# Patient Record
Sex: Female | Born: 1985 | Race: White | Hispanic: No | Smoking: Never smoker
Health system: Southern US, Community
[De-identification: ages and names within clinical notes are randomized; demographics above are authoritative.]

## PROBLEM LIST (undated history)

## (undated) ENCOUNTER — Inpatient Hospital Stay (HOSPITAL_COMMUNITY): Payer: Self-pay

## (undated) DIAGNOSIS — O139 Gestational [pregnancy-induced] hypertension without significant proteinuria, unspecified trimester: Secondary | ICD-10-CM

## (undated) DIAGNOSIS — IMO0001 Reserved for inherently not codable concepts without codable children: Secondary | ICD-10-CM

## (undated) DIAGNOSIS — R87619 Unspecified abnormal cytological findings in specimens from cervix uteri: Secondary | ICD-10-CM

## (undated) DIAGNOSIS — G43909 Migraine, unspecified, not intractable, without status migrainosus: Secondary | ICD-10-CM

## (undated) DIAGNOSIS — K429 Umbilical hernia without obstruction or gangrene: Secondary | ICD-10-CM

## (undated) DIAGNOSIS — O09299 Supervision of pregnancy with other poor reproductive or obstetric history, unspecified trimester: Secondary | ICD-10-CM

## (undated) DIAGNOSIS — Z5189 Encounter for other specified aftercare: Secondary | ICD-10-CM

## (undated) DIAGNOSIS — R58 Hemorrhage, not elsewhere classified: Secondary | ICD-10-CM

## (undated) DIAGNOSIS — D649 Anemia, unspecified: Secondary | ICD-10-CM

## (undated) HISTORY — DX: Hemorrhage, not elsewhere classified: R58

## (undated) HISTORY — DX: Encounter for other specified aftercare: Z51.89

## (undated) HISTORY — DX: Reserved for inherently not codable concepts without codable children: IMO0001

## (undated) HISTORY — DX: Supervision of pregnancy with other poor reproductive or obstetric history, unspecified trimester: O09.299

## (undated) HISTORY — DX: Unspecified abnormal cytological findings in specimens from cervix uteri: R87.619

## (undated) HISTORY — DX: Migraine, unspecified, not intractable, without status migrainosus: G43.909

## (undated) HISTORY — PX: TONSILLECTOMY: SUR1361

---
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
## 2007-09-30 ENCOUNTER — Inpatient Hospital Stay (HOSPITAL_COMMUNITY): Admission: AD | Admit: 2007-09-30 | Discharge: 2007-10-01 | Payer: Self-pay | Admitting: Family Medicine

## 2007-11-23 ENCOUNTER — Inpatient Hospital Stay (HOSPITAL_COMMUNITY): Admission: AD | Admit: 2007-11-23 | Discharge: 2007-11-23 | Payer: Self-pay | Admitting: Obstetrics & Gynecology

## 2008-03-02 ENCOUNTER — Inpatient Hospital Stay (HOSPITAL_COMMUNITY): Admission: AD | Admit: 2008-03-02 | Discharge: 2008-03-05 | Payer: Self-pay | Admitting: Obstetrics & Gynecology

## 2008-03-06 ENCOUNTER — Inpatient Hospital Stay (HOSPITAL_COMMUNITY): Admission: AD | Admit: 2008-03-06 | Discharge: 2008-03-06 | Payer: Self-pay | Admitting: Obstetrics & Gynecology

## 2008-03-08 ENCOUNTER — Inpatient Hospital Stay (HOSPITAL_COMMUNITY): Admission: AD | Admit: 2008-03-08 | Discharge: 2008-03-08 | Payer: Self-pay | Admitting: Obstetrics and Gynecology

## 2008-04-09 ENCOUNTER — Ambulatory Visit: Payer: Self-pay | Admitting: Diagnostic Radiology

## 2008-04-09 ENCOUNTER — Emergency Department (HOSPITAL_BASED_OUTPATIENT_CLINIC_OR_DEPARTMENT_OTHER): Admission: EM | Admit: 2008-04-09 | Discharge: 2008-04-09 | Payer: Self-pay | Admitting: Emergency Medicine

## 2008-04-25 ENCOUNTER — Emergency Department (HOSPITAL_COMMUNITY): Admission: EM | Admit: 2008-04-25 | Discharge: 2008-04-25 | Payer: Self-pay | Admitting: Emergency Medicine

## 2008-04-26 ENCOUNTER — Encounter: Admission: RE | Admit: 2008-04-26 | Discharge: 2008-04-26 | Payer: Self-pay | Admitting: Surgery

## 2008-07-05 ENCOUNTER — Emergency Department (HOSPITAL_BASED_OUTPATIENT_CLINIC_OR_DEPARTMENT_OTHER): Admission: EM | Admit: 2008-07-05 | Discharge: 2008-07-05 | Payer: Self-pay | Admitting: Emergency Medicine

## 2010-02-28 ENCOUNTER — Emergency Department (HOSPITAL_BASED_OUTPATIENT_CLINIC_OR_DEPARTMENT_OTHER): Admission: EM | Admit: 2010-02-28 | Discharge: 2010-02-28 | Payer: Self-pay | Admitting: Emergency Medicine

## 2010-02-28 ENCOUNTER — Ambulatory Visit: Payer: Self-pay | Admitting: Diagnostic Radiology

## 2010-04-02 DIAGNOSIS — Z5189 Encounter for other specified aftercare: Secondary | ICD-10-CM

## 2010-04-02 HISTORY — DX: Encounter for other specified aftercare: Z51.89

## 2010-04-23 ENCOUNTER — Encounter: Payer: Self-pay | Admitting: Surgery

## 2010-04-29 ENCOUNTER — Inpatient Hospital Stay (HOSPITAL_COMMUNITY)
Admission: AD | Admit: 2010-04-29 | Discharge: 2010-04-29 | Payer: Self-pay | Source: Home / Self Care | Attending: Obstetrics & Gynecology | Admitting: Obstetrics & Gynecology

## 2010-04-29 LAB — URINALYSIS, ROUTINE W REFLEX MICROSCOPIC
Leukocytes, UA: NEGATIVE
Protein, ur: NEGATIVE mg/dL
Urine Glucose, Fasting: NEGATIVE mg/dL
pH: 7 (ref 5.0–8.0)

## 2010-04-29 LAB — URINE MICROSCOPIC-ADD ON

## 2010-04-29 LAB — WET PREP, GENITAL: Clue Cells Wet Prep HPF POC: NONE SEEN

## 2010-05-02 LAB — GC/CHLAMYDIA PROBE AMP, GENITAL: Chlamydia, DNA Probe: UNDETERMINED

## 2010-06-13 LAB — WET PREP, GENITAL
Clue Cells Wet Prep HPF POC: NONE SEEN
Trich, Wet Prep: NONE SEEN
Yeast Wet Prep HPF POC: NONE SEEN

## 2010-06-13 LAB — URINALYSIS, ROUTINE W REFLEX MICROSCOPIC
Bilirubin Urine: NEGATIVE
Nitrite: NEGATIVE
Specific Gravity, Urine: 1.016 (ref 1.005–1.030)
Urobilinogen, UA: 0.2 mg/dL (ref 0.0–1.0)
pH: 8 (ref 5.0–8.0)

## 2010-06-13 LAB — PREGNANCY, URINE: Preg Test, Ur: POSITIVE

## 2010-06-13 LAB — GC/CHLAMYDIA PROBE AMP, GENITAL: Chlamydia, DNA Probe: NEGATIVE

## 2010-07-12 LAB — URINALYSIS, ROUTINE W REFLEX MICROSCOPIC
Bilirubin Urine: NEGATIVE
Specific Gravity, Urine: 1.018 (ref 1.005–1.030)
Urobilinogen, UA: 1 mg/dL (ref 0.0–1.0)
pH: 6.5 (ref 5.0–8.0)

## 2010-07-12 LAB — URINE MICROSCOPIC-ADD ON

## 2010-07-17 LAB — BASIC METABOLIC PANEL
BUN: 14 mg/dL (ref 6–23)
CO2: 25 mEq/L (ref 19–32)
Calcium: 9.8 mg/dL (ref 8.4–10.5)
Calcium: 9.1 mg/dL (ref 8.4–10.5)
Chloride: 104 mEq/L (ref 96–112)
Creatinine, Ser: 0.7 mg/dL (ref 0.4–1.2)
Creatinine, Ser: 0.65 mg/dL (ref 0.4–1.2)
GFR calc Af Amer: >60 mL/min (ref >60)
GFR calc Af Amer: >60 mL/min (ref >60)
GFR calc non Af Amer: >60 mL/min (ref >60)
Sodium: 136 mEq/L (ref 135–145)

## 2010-07-17 LAB — DIFFERENTIAL
Basophils Relative: 2 % — ABNORMAL HIGH (ref 0–1)
Basophils Relative: 0 % (ref 0–1)
Eosinophils Absolute: 0.2 10*3/uL (ref 0.0–0.7)
Lymphocytes Relative: 16 % (ref 12–46)
Lymphs Abs: 1.6 10*3/uL (ref 0.7–4.0)
Monocytes Relative: 6 % (ref 3–12)
Neutro Abs: 6.1 10*3/uL (ref 1.7–7.7)
Neutro Abs: 7.9 10*3/uL — ABNORMAL HIGH (ref 1.7–7.7)
Neutrophils Relative %: 61 % (ref 43–77)
Neutrophils Relative %: 77 % (ref 43–77)

## 2010-07-17 LAB — CBC
Hemoglobin: 11.6 g/dL — ABNORMAL LOW (ref 12.0–15.0)
MCHC: 34 g/dL (ref 30.0–36.0)
MCV: 85.3 fL (ref 78.0–100.0)
Platelets: 391 10*3/uL (ref 150–400)
RBC: 3.78 MIL/uL — ABNORMAL LOW (ref 3.87–5.11)
RBC: 4.01 MIL/uL (ref 3.87–5.11)
WBC: 9.8 10*3/uL (ref 4.0–10.5)
WBC: 10.3 10*3/uL (ref 4.0–10.5)

## 2010-07-17 LAB — D-DIMER, QUANTITATIVE: D-Dimer, Quant: 0.36 ug/mL-FEU (ref 0.00–0.48)

## 2010-08-15 NOTE — Discharge Summary (Signed)
Rebekah Smith, Rebekah Smith            ACCOUNT NO.:  [id number]   MEDICAL RECORD NO.:  [id number]          PATIENT TYPE:  INP   LOCATION:  9147                          FACILITY:  [facility]   PHYSICIAN:  Gerrit Friends. Aldona Bar, M.D.   DATE OF BIRTH:  1985/07/30   DATE OF ADMISSION:  03/02/2008  DATE OF DISCHARGE:  03/05/2008                               DISCHARGE SUMMARY   DISCHARGE DIAGNOSES:  1. A 38-week intrauterine pregnancy, delivered 8 pounds 9 ounces female      infant, Apgars 9 and 9.  2. Blood type O+.  3. Preeclampsia - induction of labor.   PROCEDURES:  1. Normal spontaneous delivery.  2. Second-degree tear and repair.  3. Labetalol for hypertension, postpartum.   SUMMARY:  This 25 year old primigravida with a due date of March 19, 2008, was admitted at 37-[redacted] weeks gestation with elevated blood pressure  for induction of labor.  She was felt to be preeclamptic.  She was begun  on Pitocin.  She progressed and on the afternoon of March 03, 2008,  had a normal spontaneous delivery of viable female infant with Apgar of 9  and 9 - infant weighed 8 pounds 9 ounces - second-degree tear with  repair and her postpartum course other than anemia and some hypertension  was benign.  She was given some labetalol postpartum with resolution of  her hypertension and in fact, discharged on labetalol 200 mg every 8  hours for 2 weeks as well at the time of discharge.  Her hemoglobin at  the time of discharge was 7.5 which was stable with a white count of  9700 and a platelet count of 156,000.  She also had a comprehensive  metabolic profile on March 05, 2008, which was within normal limits  except for a potassium of 3.3 which was actually elevated from the  previous potassium levels.  She was watched during the day on March 05, 2008 and remained relatively normotensive and was desirous of  discharge.  Accordingly, she was given all appropriate instructions and  discharged to home on the  afternoon of March 05, 2008.   DISCHARGE MEDICATIONS:  1. Motrin 600 mg every 6 hours as needed for cramping or pain.  2. Tylox 1-2 every 4-6 hours as needed for more severe pain.  3. Labetalol 200 mg (100 mg - will take 2 tablets) every 8 hours for      the next 2 weeks for hypertension control.   She was asked to return to the office in approximately two and half  weeks after being off labetalol for several days for blood pressure  check or sooner if needed - she will check her and monitor her pressures  at home.  She was also told to make an appointment for postpartum visit  about for four and half weeks from now.   In addition, she will continue on her vitamins - one a day and was told  to get some Feosol capsules and use one of those daily as well to  correct her anemia.   She was given an instruction sheet at the  time of discharge and  understood all instructions well.   CONDITION ON DISCHARGE:  Improved.      Gerrit Friends. Aldona Bar, M.D.  Electronically Signed     RMW/MEDQ  D:  03/05/2008  T:  03/06/2008  Job:  [id number]

## 2010-08-17 ENCOUNTER — Other Ambulatory Visit: Payer: Self-pay | Admitting: Obstetrics and Gynecology

## 2010-08-17 DIAGNOSIS — N6325 Unspecified lump in the left breast, overlapping quadrants: Secondary | ICD-10-CM

## 2010-08-18 ENCOUNTER — Other Ambulatory Visit: Payer: Self-pay

## 2010-08-23 ENCOUNTER — Inpatient Hospital Stay: Admission: RE | Admit: 2010-08-23 | Payer: Self-pay | Source: Ambulatory Visit

## 2010-08-31 ENCOUNTER — Inpatient Hospital Stay (HOSPITAL_COMMUNITY)
Admission: AD | Admit: 2010-08-31 | Discharge: 2010-09-05 | DRG: 774 | Disposition: A | Discharge status: Home / Self Care | Payer: Medicaid Other | Source: Ambulatory Visit | Attending: Obstetrics and Gynecology | Admitting: Obstetrics and Gynecology

## 2010-08-31 DIAGNOSIS — D649 Anemia, unspecified: Secondary | ICD-10-CM | POA: Diagnosis not present

## 2010-08-31 DIAGNOSIS — O9903 Anemia complicating the puerperium: Secondary | ICD-10-CM | POA: Diagnosis not present

## 2010-08-31 DIAGNOSIS — O139 Gestational [pregnancy-induced] hypertension without significant proteinuria, unspecified trimester: Principal | ICD-10-CM | POA: Diagnosis present

## 2010-08-31 DIAGNOSIS — O2650 Maternal hypotension syndrome, unspecified trimester: Secondary | ICD-10-CM | POA: Diagnosis not present

## 2010-08-31 LAB — CBC
HCT: 30.8 % — ABNORMAL LOW (ref 36.0–46.0)
Hemoglobin: 10.5 g/dL — ABNORMAL LOW (ref 12.0–15.0)
MCH: 29.4 pg (ref 26.0–34.0)
MCV: 86.3 fL (ref 78.0–100.0)
RBC: 3.57 MIL/uL — ABNORMAL LOW (ref 3.87–5.11)

## 2010-08-31 LAB — COMPREHENSIVE METABOLIC PANEL
ALT: 7 U/L (ref 0–35)
AST: 17 U/L (ref 0–37)
CO2: 22 mEq/L (ref 19–32)
Chloride: 101 mEq/L (ref 96–112)
Creatinine, Ser: 0.61 mg/dL (ref 0.4–1.2)
GFR calc Af Amer: >60 mL/min (ref >60)
GFR calc non Af Amer: >60 mL/min (ref >60)
Glucose, Bld: 76 mg/dL (ref 70–99)
Total Bilirubin: 0.7 mg/dL (ref 0.3–1.2)

## 2010-09-01 LAB — COMPREHENSIVE METABOLIC PANEL
ALT: 7 U/L (ref 0–35)
AST: 22 U/L (ref 0–37)
Albumin: 1.8 g/dL — ABNORMAL LOW (ref 3.5–5.2)
Calcium: 7.9 mg/dL — ABNORMAL LOW (ref 8.4–10.5)
Creatinine, Ser: 0.76 mg/dL (ref 0.4–1.2)
GFR calc Af Amer: >60 mL/min (ref >60)
Sodium: 134 mEq/L — ABNORMAL LOW (ref 135–145)
Total Protein: 4 g/dL — ABNORMAL LOW (ref 6.0–8.3)

## 2010-09-01 LAB — CBC
HCT: 29.6 % — ABNORMAL LOW (ref 36.0–46.0)
MCH: 29.3 pg (ref 26.0–34.0)
MCHC: 34.1 g/dL (ref 30.0–36.0)
MCHC: 34.4 g/dL (ref 30.0–36.0)
MCV: 86.3 fL (ref 78.0–100.0)
Platelets: 127 10*3/uL — ABNORMAL LOW (ref 150–400)
Platelets: 109 10*3/uL — ABNORMAL LOW (ref 150–400)
RDW: 14.1 % (ref 11.5–15.5)
WBC: 10.4 10*3/uL (ref 4.0–10.5)

## 2010-09-01 LAB — URINALYSIS, ROUTINE W REFLEX MICROSCOPIC
Glucose, UA: NEGATIVE mg/dL
Hgb urine dipstick: NEGATIVE
Ketones, ur: 15 mg/dL — AB
Protein, ur: NEGATIVE mg/dL

## 2010-09-02 LAB — CBC
HCT: 21.7 % — ABNORMAL LOW (ref 36.0–46.0)
Hemoglobin: 7.2 g/dL — ABNORMAL LOW (ref 12.0–15.0)
MCH: 28.9 pg (ref 26.0–34.0)
MCV: 87.1 fL (ref 78.0–100.0)
RBC: 2.49 MIL/uL — ABNORMAL LOW (ref 3.87–5.11)

## 2010-09-02 LAB — BASIC METABOLIC PANEL
BUN: 4 mg/dL — ABNORMAL LOW (ref 6–23)
CO2: 24 mEq/L (ref 19–32)
Chloride: 105 mEq/L (ref 96–112)
Creatinine, Ser: 0.56 mg/dL (ref 0.4–1.2)
GFR calc Af Amer: >60 mL/min (ref >60)
Glucose, Bld: 86 mg/dL (ref 70–99)

## 2010-09-03 LAB — CBC
HCT: 26.4 % — ABNORMAL LOW (ref 36.0–46.0)
HCT: 31.2 % — ABNORMAL LOW (ref 36.0–46.0)
Hemoglobin: 10.6 g/dL — ABNORMAL LOW (ref 12.0–15.0)
MCH: 29.6 pg (ref 26.0–34.0)
MCHC: 34 g/dL (ref 30.0–36.0)
Platelets: 113 10*3/uL — ABNORMAL LOW (ref 150–400)
RDW: 14.9 % (ref 11.5–15.5)
WBC: 6.5 10*3/uL (ref 4.0–10.5)

## 2010-09-03 LAB — COMPREHENSIVE METABOLIC PANEL
ALT: 8 U/L (ref 0–35)
Albumin: 1.9 g/dL — ABNORMAL LOW (ref 3.5–5.2)
Alkaline Phosphatase: 114 U/L (ref 39–117)
Potassium: 3.8 mEq/L (ref 3.5–5.1)
Sodium: 134 mEq/L — ABNORMAL LOW (ref 135–145)
Total Protein: 4.5 g/dL — ABNORMAL LOW (ref 6.0–8.3)

## 2010-09-04 LAB — CROSSMATCH
Antibody Screen: NEGATIVE
Unit division: 0
Unit division: 0

## 2010-09-04 LAB — CREATININE CLEARANCE, URINE, 24 HOUR
Collection Interval-CRCL: 24 hours
Creatinine Clearance: 260 mL/min — ABNORMAL HIGH (ref 75–115)
Creatinine, 24H Ur: 2134 mg/d — ABNORMAL HIGH (ref 700–1800)

## 2010-09-05 LAB — COMPREHENSIVE METABOLIC PANEL
Albumin: 2.4 g/dL — ABNORMAL LOW (ref 3.5–5.2)
BUN: 10 mg/dL (ref 6–23)
Chloride: 102 mEq/L (ref 96–112)
Creatinine, Ser: 0.55 mg/dL (ref 0.4–1.2)
GFR calc non Af Amer: >60 mL/min (ref >60)
Glucose, Bld: 89 mg/dL (ref 70–99)
Total Bilirubin: 0.4 mg/dL (ref 0.3–1.2)

## 2010-09-05 LAB — LACTATE DEHYDROGENASE: LDH: 303 U/L — ABNORMAL HIGH (ref 94–250)

## 2010-09-05 LAB — CBC
Hemoglobin: 11 g/dL — ABNORMAL LOW (ref 12.0–15.0)
MCH: 29.6 pg (ref 26.0–34.0)
MCHC: 33.3 g/dL (ref 30.0–36.0)
MCV: 88.7 fL (ref 78.0–100.0)

## 2010-09-05 LAB — URIC ACID: Uric Acid, Serum: 5.8 mg/dL (ref 2.4–7.0)

## 2010-09-05 LAB — PROTEIN, URINE, 24 HOUR
Collection Interval-UPROT: 24 hours
Protein, Urine: 5 mg/dL

## 2010-12-28 LAB — URINALYSIS, ROUTINE W REFLEX MICROSCOPIC
Glucose, UA: NEGATIVE
Leukocytes, UA: NEGATIVE
Nitrite: NEGATIVE
Protein, ur: NEGATIVE
Urobilinogen, UA: 0.2

## 2010-12-28 LAB — URINE MICROSCOPIC-ADD ON

## 2011-01-05 LAB — CBC
HCT: 21.3 % — ABNORMAL LOW (ref 36.0–46.0)
HCT: 21.5 % — ABNORMAL LOW (ref 36.0–46.0)
HCT: 22.5 % — ABNORMAL LOW (ref 36.0–46.0)
HCT: 23.5 % — ABNORMAL LOW (ref 36.0–46.0)
HCT: 33.2 % — ABNORMAL LOW (ref 36.0–46.0)
HCT: 35.2 % — ABNORMAL LOW (ref 36.0–46.0)
Hemoglobin: 12.1 g/dL (ref 12.0–15.0)
Hemoglobin: 7.5 g/dL — CL (ref 12.0–15.0)
MCHC: 34.5 g/dL (ref 30.0–36.0)
MCHC: 35.3 g/dL (ref 30.0–36.0)
MCHC: 35.4 g/dL (ref 30.0–36.0)
MCHC: 35.5 g/dL (ref 30.0–36.0)
MCV: 88.9 fL (ref 78.0–100.0)
MCV: 89.1 fL (ref 78.0–100.0)
MCV: 89.5 fL (ref 78.0–100.0)
MCV: 89.5 fL (ref 78.0–100.0)
MCV: 89.9 fL (ref 78.0–100.0)
MCV: 90 fL (ref 78.0–100.0)
Platelets: 179 10*3/uL (ref 150–400)
Platelets: 202 10*3/uL (ref 150–400)
Platelets: 239 10*3/uL (ref 150–400)
Platelets: 250 10*3/uL (ref 150–400)
RBC: 2.36 MIL/uL — ABNORMAL LOW (ref 3.87–5.11)
RBC: 2.41 MIL/uL — ABNORMAL LOW (ref 3.87–5.11)
RBC: 2.46 MIL/uL — ABNORMAL LOW (ref 3.87–5.11)
RBC: 3.27 MIL/uL — ABNORMAL LOW (ref 3.87–5.11)
RBC: 3.93 MIL/uL (ref 3.87–5.11)
RDW: 13 % (ref 11.5–15.5)
RDW: 13.3 % (ref 11.5–15.5)
RDW: 13.4 % (ref 11.5–15.5)
WBC: 10.4 10*3/uL (ref 4.0–10.5)
WBC: 12.4 10*3/uL — ABNORMAL HIGH (ref 4.0–10.5)
WBC: 22.1 10*3/uL — ABNORMAL HIGH (ref 4.0–10.5)
WBC: 9.3 10*3/uL (ref 4.0–10.5)

## 2011-01-05 LAB — URINALYSIS, ROUTINE W REFLEX MICROSCOPIC
Glucose, UA: NEGATIVE mg/dL
Hgb urine dipstick: NEGATIVE
Ketones, ur: NEGATIVE mg/dL
Protein, ur: NEGATIVE mg/dL

## 2011-01-05 LAB — COMPREHENSIVE METABOLIC PANEL
ALT: 10 U/L (ref 0–35)
ALT: 11 U/L (ref 0–35)
ALT: 11 U/L (ref 0–35)
AST: 26 U/L (ref 0–37)
AST: 34 U/L (ref 0–37)
Albumin: 2.2 g/dL — ABNORMAL LOW (ref 3.5–5.2)
Albumin: 2.2 g/dL — ABNORMAL LOW (ref 3.5–5.2)
Albumin: 2.6 g/dL — ABNORMAL LOW (ref 3.5–5.2)
Alkaline Phosphatase: 106 U/L (ref 39–117)
BUN: 2 mg/dL — ABNORMAL LOW (ref 6–23)
BUN: 2 mg/dL — ABNORMAL LOW (ref 6–23)
BUN: <1 mg/dL — ABNORMAL LOW (ref 6–23)
BUN: <1 mg/dL — ABNORMAL LOW (ref 6–23)
CO2: 23 mEq/L (ref 19–32)
CO2: 27 mEq/L (ref 19–32)
CO2: 27 mEq/L (ref 19–32)
CO2: 28 mEq/L (ref 19–32)
Calcium: 7.6 mg/dL — ABNORMAL LOW (ref 8.4–10.5)
Calcium: 8.5 mg/dL (ref 8.4–10.5)
Calcium: 8.5 mg/dL (ref 8.4–10.5)
Calcium: 8.7 mg/dL (ref 8.4–10.5)
Chloride: 106 mEq/L (ref 96–112)
Chloride: 108 mEq/L (ref 96–112)
Creatinine, Ser: 0.46 mg/dL (ref 0.4–1.2)
Creatinine, Ser: 0.58 mg/dL (ref 0.4–1.2)
Creatinine, Ser: 0.82 mg/dL (ref 0.4–1.2)
GFR calc Af Amer: >60 mL/min (ref >60)
GFR calc non Af Amer: >60 mL/min (ref >60)
GFR calc non Af Amer: >60 mL/min (ref >60)
GFR calc non Af Amer: >60 mL/min (ref >60)
Glucose, Bld: 90 mg/dL (ref 70–99)
Glucose, Bld: 92 mg/dL (ref 70–99)
Potassium: 3.3 mEq/L — ABNORMAL LOW (ref 3.5–5.1)
Sodium: 137 mEq/L (ref 135–145)
Sodium: 138 mEq/L (ref 135–145)
Total Bilirubin: 0.1 mg/dL — ABNORMAL LOW (ref 0.3–1.2)
Total Bilirubin: 1 mg/dL (ref 0.3–1.2)
Total Protein: 3.7 g/dL — ABNORMAL LOW (ref 6.0–8.3)
Total Protein: 5.3 g/dL — ABNORMAL LOW (ref 6.0–8.3)
Total Protein: 5.4 g/dL — ABNORMAL LOW (ref 6.0–8.3)

## 2011-01-05 LAB — URIC ACID: Uric Acid, Serum: 7.5 mg/dL — ABNORMAL HIGH (ref 2.4–7.0)

## 2011-01-05 LAB — URINALYSIS, DIPSTICK ONLY
Nitrite: NEGATIVE
Protein, ur: 30 mg/dL — AB
Specific Gravity, Urine: 1.01 (ref 1.005–1.030)
Urobilinogen, UA: 0.2 mg/dL (ref 0.0–1.0)

## 2011-10-05 ENCOUNTER — Ambulatory Visit (HOSPITAL_COMMUNITY)
Admission: RE | Admit: 2011-10-05 | Discharge: 2011-10-05 | Disposition: A | Discharge status: Home / Self Care | Payer: Medicaid Other | Source: Ambulatory Visit | Attending: Family Medicine | Admitting: Family Medicine

## 2011-10-05 ENCOUNTER — Encounter: Payer: Self-pay | Admitting: Family Medicine

## 2011-10-05 ENCOUNTER — Ambulatory Visit (INDEPENDENT_AMBULATORY_CARE_PROVIDER_SITE_OTHER): Payer: Medicaid Other | Admitting: Family Medicine

## 2011-10-05 ENCOUNTER — Other Ambulatory Visit: Payer: Self-pay

## 2011-10-05 VITALS — BP 134/88 | HR 73 | Ht 67.0 in | Wt 137.8 lb

## 2011-10-05 DIAGNOSIS — N920 Excessive and frequent menstruation with regular cycle: Secondary | ICD-10-CM

## 2011-10-05 DIAGNOSIS — R002 Palpitations: Secondary | ICD-10-CM | POA: Insufficient documentation

## 2011-10-05 DIAGNOSIS — I498 Other specified cardiac arrhythmias: Secondary | ICD-10-CM | POA: Insufficient documentation

## 2011-10-05 DIAGNOSIS — R0789 Other chest pain: Secondary | ICD-10-CM | POA: Insufficient documentation

## 2011-10-05 MED ORDER — MELOXICAM 15 MG PO TABS: 15.0000 mg | ORAL_TABLET | Freq: Every day | ORAL | Status: DC

## 2011-10-05 NOTE — Assessment & Plan Note (Signed)
Will have patient follow up in 2-3 weeks for repeat pap, pelvic, and breast exams.  May consider [place] following this.  Patient not having significant signs of anemia and reports that bleeding has been light but continuous for past several months, although it stopped two weeks ago.

## 2011-10-05 NOTE — Assessment & Plan Note (Signed)
Most consistent with costochondritis.  Anxiety may also play a role.  Will give trial of mobic and RTC in 2-3 weeks.

## 2011-10-05 NOTE — Progress Notes (Signed)
Subjective: The patient is a 26 y.o. year old female who presents today for new patient appointment.  1. Palpitations: Patient reports that her last value she has had problems with palpitations (generally consisting of a fast heartbeat) for the last 3 years. This occurs 2-3 times per week and is not related to activity. She finds these episodes quite concerning. She notices it more at night. They are not accompanied by chest discomfort or problems breathing. They never last more than a second or 2.  2. Chest discomfort: Patient reports intermittent problems with chest discomfort and a feeling that she can't breathe. This is not accompanied by any feelings of anxiety and is typically alleviated by fresh air and water.  Not related to exercise.  No history of asthma.  3. Menorrhagia: Since birth of last child one year ago has had lots of problems with bleeding.  Bled for about 3 months straight recently, stopped about 3 weeks ago.  Flow is light (1 pad per day) but consistent.  4. Breast concerns: Reports maternal aunt had breast mass that was removed in her early 40s.  Is worried that she may have breast cancer and is wondering about mammogram.  Patient did have infected cyst during the time she was breast feeding her 24 year old and reports that this area of the breast has felt different to her since then.  Patient's past medical, social, and family history were reviewed and updated as appropriate. History  Substance Use Topics  . Smoking status: Never Smoker   . Smokeless tobacco: Never Used  . Alcohol Use: No   Objective:  Filed Vitals:   10/05/11 1507  BP: 134/88  Pulse: 73   Gen: NAD, appropriate weight. HEENT: MMM, PERRL, EOMI, trachea midline, no thyromegaly CV: RRR, no murmurs Resp: CTABL Chest: Discomfort is reproducible by palpation over the left costochondral border. Ext: No edema  EKG: Sinus with PACs.  Rate 58.  Normal axis, good R-wave progression.  No concerning ST segment  findings.  Assessment/Plan: Will have patient do more inquiring about breast problem aunt had, whether was actually cancer or fibroadenoma.  Will perform breast exam at f/u appointment.  Please also see individual problems in problem list for problem-specific plans.

## 2011-10-05 NOTE — Patient Instructions (Signed)
It was great to meet you today! I am not concerned about anything on your EKG. I would like you to take the medication Mobic once per day for the next two weeks.  Make certain to take it with food. Come back to see me in several weeks so we can do a pap smear, pelvic and breast exams.

## 2011-10-05 NOTE — Assessment & Plan Note (Signed)
Unremarkable EKG and unconcerning history.  Will have the patient continue to monitor and report if episodes are increasing or cause dizziness or worsening chest pain.

## 2012-08-15 ENCOUNTER — Ambulatory Visit: Payer: Medicaid Other | Admitting: Family Medicine

## 2012-08-15 ENCOUNTER — Ambulatory Visit (INDEPENDENT_AMBULATORY_CARE_PROVIDER_SITE_OTHER): Payer: Medicaid Other | Admitting: Family Medicine

## 2012-08-15 ENCOUNTER — Encounter: Payer: Self-pay | Admitting: Family Medicine

## 2012-08-15 VITALS — BP 118/86 | HR 105 | Temp 98.0°F | Ht 67.0 in | Wt 134.2 lb

## 2012-08-15 DIAGNOSIS — R059 Cough, unspecified: Secondary | ICD-10-CM

## 2012-08-15 DIAGNOSIS — R05 Cough: Secondary | ICD-10-CM

## 2012-08-15 MED ORDER — AMOXICILLIN-POT CLAVULANATE 875-125 MG PO TABS: 1.0000 | ORAL_TABLET | Freq: Two times a day (BID) | ORAL | Status: DC

## 2012-08-15 NOTE — Progress Notes (Signed)
  Subjective:    Patient ID: Colletta [place], female    DOB: October 08, 1985, 27 y.o.   MRN: [id number]  HPI # [id number]. Yesterday her nose started to get really stopped up with a bad cough.  Her symptoms are worse today Her mouth has been real dry and her throat has been hurting but she has not taken anything  Her stepson has a sinus infection with symptoms starting this week He is on amoxicillin   Review of Systems Per HPI Denies fevers, chills, nausea, difficulty breathing   Allergies, medication, past medical history reviewed.  Smoking status noted.     Objective:   Physical Exam GEN: appears uncomfortable HEENT:   Head: Rosine; mild tenderness bilateral maxillary sinuses   Eyes: normal conjunctiva without injection or tearing   Ears: TM clear bilaterally with good light reflex and without erythema or air-fluid level   Nose: clear nasal discharge, nasal congestion   Mouth: MMM; mild tonsillar adenopathy without exudates NECK: no LAD PULM: NI WOB; CTAB without w/r/r CV: RRR SKIN: dry, no obvious rash    Assessment & Plan:

## 2012-08-15 NOTE — Patient Instructions (Addendum)
I think you probably have a viral infection  -Fluids -Ibuprofen (600-800 mg every 6 hours as needed for pain/fevers), Motrin as needed  Viral infection usually take 5-10 days to go away

## 2012-08-18 DIAGNOSIS — R05 Cough: Secondary | ICD-10-CM | POA: Insufficient documentation

## 2012-08-18 DIAGNOSIS — R059 Cough, unspecified: Secondary | ICD-10-CM | POA: Insufficient documentation

## 2012-08-18 NOTE — Assessment & Plan Note (Signed)
Likely URI +/- allergies.  -Motrin prn  -Encouraged fluids -Discussed most cases are viral, however, she lives about an hour away, so Rx given for Augmentin for bacterial rhinosinusitis in case her symptoms do not improve next 5-10 days or worrisome. If continues to worsen, advised to follow-up.

## 2012-09-19 ENCOUNTER — Ambulatory Visit (INDEPENDENT_AMBULATORY_CARE_PROVIDER_SITE_OTHER): Payer: Medicaid Other | Admitting: *Deleted

## 2012-09-19 DIAGNOSIS — Z309 Encounter for contraceptive management, unspecified: Secondary | ICD-10-CM

## 2012-09-19 LAB — POCT URINE PREGNANCY: Preg Test, Ur: NEGATIVE

## 2012-09-22 MED ORDER — MEDROXYPROGESTERONE ACETATE 150 MG/ML IM SUSP
150.0000 mg | Freq: Once | INTRAMUSCULAR | Status: AC
  Administered 2012-09-19: 150 mg via INTRAMUSCULAR

## 2012-09-22 NOTE — Progress Notes (Signed)
Patient here to restart her Depo Provera.  Is on period now.  Obtained urine pregnancy test---negative.  Depo Provera given per protocol and information provided to patient.  Appt card given--next Depo due Sept 5-19.  Gaylene Brooks, RN

## 2012-11-13 ENCOUNTER — Telehealth: Payer: Self-pay | Admitting: Family Medicine

## 2012-11-18 NOTE — Telephone Encounter (Signed)
error 

## 2013-02-18 ENCOUNTER — Encounter (HOSPITAL_BASED_OUTPATIENT_CLINIC_OR_DEPARTMENT_OTHER): Payer: Self-pay | Admitting: Emergency Medicine

## 2013-02-18 ENCOUNTER — Emergency Department (HOSPITAL_BASED_OUTPATIENT_CLINIC_OR_DEPARTMENT_OTHER)
Admission: EM | Admit: 2013-02-18 | Discharge: 2013-02-19 | Disposition: A | Discharge status: Home / Self Care | Payer: Medicaid Other | Attending: Emergency Medicine | Admitting: Emergency Medicine

## 2013-02-18 DIAGNOSIS — Y9389 Activity, other specified: Secondary | ICD-10-CM | POA: Insufficient documentation

## 2013-02-18 DIAGNOSIS — IMO0002 Reserved for concepts with insufficient information to code with codable children: Secondary | ICD-10-CM | POA: Insufficient documentation

## 2013-02-18 DIAGNOSIS — Z3202 Encounter for pregnancy test, result negative: Secondary | ICD-10-CM | POA: Insufficient documentation

## 2013-02-18 DIAGNOSIS — Y9289 Other specified places as the place of occurrence of the external cause: Secondary | ICD-10-CM | POA: Insufficient documentation

## 2013-02-18 DIAGNOSIS — S3981XA Other specified injuries of abdomen, initial encounter: Secondary | ICD-10-CM | POA: Insufficient documentation

## 2013-02-18 DIAGNOSIS — S20219A Contusion of unspecified front wall of thorax, initial encounter: Secondary | ICD-10-CM | POA: Insufficient documentation

## 2013-02-18 DIAGNOSIS — S298XXA Other specified injuries of thorax, initial encounter: Secondary | ICD-10-CM

## 2013-02-18 NOTE — ED Notes (Addendum)
Pt c/o right rib injury x 2 hrs ago ? preg

## 2013-02-18 NOTE — ED Notes (Signed)
MD at bedside. 

## 2013-02-18 NOTE — ED Provider Notes (Signed)
CSN: [id number]     Arrival date & time 02/18/13  2331 History  This chart was scribed for Hanley Seamen, MD by Dorothey Baseman, ED Scribe. This patient was seen in room MH11/MH11 and the patient's care was started at 11:58 PM.    Chief Complaint  Patient presents with  . Rib Injury   The history is provided by the patient. No language interpreter was used.   HPI Comments: Rebekah Smith is a 27 y.o. female who presents to the Emergency Department complaining of an injury to the right, lower ribs that occurred when she states that she ran into a chair around 3 hours ago. She reports associated pain, 6/10 currently, to the area that is exacerbated with sitting up or bending forward. She denies shortness of breath or pain with deep breathing. She reports associated ecchymosis and swelling to the area. Patient expresses that she may be pregnant because her breasts are enlarged and sore, but took an at-home pregnancy test 5 days ago that was negative. She denies any vaginal bleeding or discharge. Patient denies any other pertinent medical history.  Past Medical History  Diagnosis Date  . Blood transfusion    History reviewed. No pertinent past surgical history. Family History  Problem Relation Age of Onset  . Breast cancer Maternal Aunt   . Breast cancer Paternal Grandmother   . Cervical cancer Maternal Aunt   . Anxiety disorder Mother   . Hypertension Mother   . Stroke Maternal Grandmother    History  Substance Use Topics  . Smoking status: Never Smoker   . Smokeless tobacco: Never Used  . Alcohol Use: No   OB History   Grav Para Term Preterm Abortions TAB SAB Ect Mult Living   3 3 3       3      Review of Systems  A complete 10 system review of systems was obtained and all systems are negative except as noted in the HPI and PMH.   Allergies  Review of patient's allergies indicates no known allergies.  Home Medications  No current outpatient prescriptions on file.  Triage  Vitals: BP 143/93  Pulse 90  Temp(Src) 97.9 F (36.6 C) (Oral)  Resp 18  Ht 5\' 7"  (1.702 m)  Wt 135 lb (61.236 kg)  BMI 21.14 kg/m2  SpO2 100%  Physical Exam  Nursing note and vitals reviewed. Constitutional: She is oriented to person, place, and time. She appears well-developed and well-nourished. No distress.  HENT:  Head: Normocephalic and atraumatic.  Eyes: Conjunctivae are normal.  Neck: Normal range of motion. Neck supple.  Cardiovascular: Normal rate, regular rhythm and normal heart sounds.  Exam reveals no gallop and no friction rub.   No murmur heard. Pulmonary/Chest: Effort normal and breath sounds normal. No respiratory distress. She has no wheezes. She has no rales.  Breath sounds are equal and symmetric bilaterally.   Abdominal: Soft. Bowel sounds are normal. She exhibits no distension. There is tenderness.  Mild suprapubic tenderness to palpation.   Musculoskeletal: Normal range of motion.  Tenderness to palpation to right, lower ribcage with some swelling and ecchymosis. No crepitus.   Neurological: She is alert and oriented to person, place, and time.  Skin: Skin is warm and dry.  Psychiatric: She has a normal mood and affect. Her behavior is normal.    ED Course  Procedures (including critical care time)  DIAGNOSTIC STUDIES: Oxygen Saturation is 100% on room air, normal by my interpretation.  COORDINATION OF CARE: 12:02 PM- Will order UA and a CT of the abdomen. Discussed treatment plan with patient at bedside and patient verbalized agreement.    MDM   Nursing notes and vitals signs, including pulse oximetry, reviewed.  Summary of this visit's results, reviewed by myself:  Labs:  Results for orders placed during the hospital encounter of 02/18/13 (from the past 24 hour(s))  URINALYSIS, ROUTINE W REFLEX MICROSCOPIC     Status: Abnormal   Collection Time    02/19/13 12:12 AM      Result Value Range   Color, Urine YELLOW  YELLOW   APPearance CLEAR   CLEAR   Specific Gravity, Urine 1.026  1.005 - 1.030   pH 6.0  5.0 - 8.0   Glucose, UA NEGATIVE  NEGATIVE mg/dL   Hgb urine dipstick SMALL (*) NEGATIVE   Bilirubin Urine NEGATIVE  NEGATIVE   Ketones, ur NEGATIVE  NEGATIVE mg/dL   Protein, ur NEGATIVE  NEGATIVE mg/dL   Urobilinogen, UA 0.2  0.0 - 1.0 mg/dL   Nitrite NEGATIVE  NEGATIVE   Leukocytes, UA NEGATIVE  NEGATIVE  PREGNANCY, URINE     Status: None   Collection Time    02/19/13 12:12 AM      Result Value Range   Preg Test, Ur NEGATIVE  NEGATIVE  URINE MICROSCOPIC-ADD ON     Status: Abnormal   Collection Time    02/19/13 12:12 AM      Result Value Range   Squamous Epithelial / LPF RARE  RARE   WBC, UA 0-2  <3 WBC/hpf   RBC / HPF 0-2  <3 RBC/hpf   Bacteria, UA FEW (*) RARE   Crystals CA OXALATE CRYSTALS (*) NEGATIVE   Urine-Other MUCOUS PRESENT      Imaging Studies: Ct Abdomen W Contrast  02/19/2013   CLINICAL DATA:  Right upper quadrant abdominal pain after trauma the  EXAM: CT ABDOMEN WITH CONTRAST  TECHNIQUE: Multidetector CT imaging of the abdomen was performed using the standard protocol following bolus administration of intravenous contrast.  CONTRAST:  OMNIPAQUE IOHEXOL 300 MG/ML  SOLN  COMPARISON:  None.  FINDINGS: The liver, spleen, pancreas, and adrenal glands appear unremarkable. No specific gallbladder or biliary abnormality identified. Kidneys and proximal ureters unremarkable. No pathologic upper abdominal adenopathy is observed. Visualized portion of the appendix normal. Regional abdominal wall musculature intact.  IMPRESSION: 1. No significant abnormality observed.   Electronically Signed   By: Herbie Baltimore M.D.   On: 02/19/2013 01:26      I personally performed the services described in this documentation, which was scribed in my presence.  The recorded information has been reviewed and is accurate.    Hanley Seamen, MD 02/19/13 0130

## 2013-02-19 ENCOUNTER — Emergency Department (HOSPITAL_BASED_OUTPATIENT_CLINIC_OR_DEPARTMENT_OTHER): Payer: Medicaid Other

## 2013-02-19 LAB — URINE MICROSCOPIC-ADD ON

## 2013-02-19 LAB — URINALYSIS, ROUTINE W REFLEX MICROSCOPIC
Glucose, UA: NEGATIVE mg/dL
Ketones, ur: NEGATIVE mg/dL
Leukocytes, UA: NEGATIVE
Protein, ur: NEGATIVE mg/dL
Specific Gravity, Urine: 1.026 (ref 1.005–1.030)

## 2013-02-19 LAB — PREGNANCY, URINE: Preg Test, Ur: NEGATIVE

## 2013-02-19 MED ORDER — SODIUM CHLORIDE 0.9 % IV SOLN
INTRAVENOUS | Status: DC
  Administered 2013-02-19: 01:00:00 via INTRAVENOUS

## 2013-02-19 MED ORDER — HYDROCODONE-ACETAMINOPHEN 5-325 MG PO TABS
2.0000 | ORAL_TABLET | Freq: Once | ORAL | Status: AC
  Administered 2013-02-19: 2 via ORAL
  Filled 2013-02-19: qty 2

## 2013-02-19 MED ORDER — IOHEXOL 300 MG/ML  SOLN
100.0000 mL | Freq: Once | INTRAMUSCULAR | Status: AC | PRN
  Administered 2013-02-19: 100 mL via INTRAVENOUS

## 2013-02-19 MED ORDER — HYDROCODONE-ACETAMINOPHEN 5-325 MG PO TABS: 1.0000 | ORAL_TABLET | Freq: Four times a day (QID) | ORAL | Status: DC | PRN

## 2013-02-19 NOTE — ED Notes (Signed)
Patient transported to CT 

## 2013-06-24 ENCOUNTER — Telehealth: Payer: Self-pay | Admitting: Family Medicine

## 2013-06-24 NOTE — Telephone Encounter (Signed)
Please advice.Rebekah Smith, Rebekah BouquetGiovanna Smith

## 2013-06-24 NOTE — Telephone Encounter (Signed)
Pt is pregnant and would [facility]. Drs office needs to be notified also

## 2013-06-26 ENCOUNTER — Encounter (HOSPITAL_BASED_OUTPATIENT_CLINIC_OR_DEPARTMENT_OTHER): Payer: Self-pay | Admitting: Emergency Medicine

## 2013-06-26 ENCOUNTER — Emergency Department (HOSPITAL_BASED_OUTPATIENT_CLINIC_OR_DEPARTMENT_OTHER)
Admission: EM | Admit: 2013-06-26 | Discharge: 2013-06-27 | Disposition: A | Discharge status: Home / Self Care | Payer: Medicaid Other | Attending: Emergency Medicine | Admitting: Emergency Medicine

## 2013-06-26 DIAGNOSIS — R000 Tachycardia, unspecified: Secondary | ICD-10-CM | POA: Insufficient documentation

## 2013-06-26 DIAGNOSIS — IMO0002 Reserved for concepts with insufficient information to code with codable children: Secondary | ICD-10-CM | POA: Insufficient documentation

## 2013-06-26 DIAGNOSIS — O169 Unspecified maternal hypertension, unspecified trimester: Secondary | ICD-10-CM | POA: Insufficient documentation

## 2013-06-26 DIAGNOSIS — O9989 Other specified diseases and conditions complicating pregnancy, childbirth and the puerperium: Secondary | ICD-10-CM | POA: Insufficient documentation

## 2013-06-26 DIAGNOSIS — R0602 Shortness of breath: Secondary | ICD-10-CM | POA: Insufficient documentation

## 2013-06-26 DIAGNOSIS — I10 Essential (primary) hypertension: Secondary | ICD-10-CM

## 2013-06-26 DIAGNOSIS — Z79899 Other long term (current) drug therapy: Secondary | ICD-10-CM | POA: Insufficient documentation

## 2013-06-26 DIAGNOSIS — O09291 Supervision of pregnancy with other poor reproductive or obstetric history, first trimester: Secondary | ICD-10-CM

## 2013-06-26 DIAGNOSIS — R109 Unspecified abdominal pain: Secondary | ICD-10-CM | POA: Insufficient documentation

## 2013-06-26 DIAGNOSIS — R102 Pelvic and perineal pain: Secondary | ICD-10-CM

## 2013-06-26 LAB — URINALYSIS, ROUTINE W REFLEX MICROSCOPIC
BILIRUBIN URINE: NEGATIVE
Glucose, UA: NEGATIVE mg/dL
HGB URINE DIPSTICK: NEGATIVE
Ketones, ur: NEGATIVE mg/dL
Leukocytes, UA: NEGATIVE
Nitrite: NEGATIVE
Protein, ur: NEGATIVE mg/dL
Specific Gravity, Urine: 1.026 (ref 1.005–1.030)
Urobilinogen, UA: 1 mg/dL (ref 0.0–1.0)
pH: 6 (ref 5.0–8.0)

## 2013-06-26 LAB — PREGNANCY, URINE: Preg Test, Ur: POSITIVE — AB

## 2013-06-26 MED ORDER — SODIUM CHLORIDE 0.9 % IV BOLUS (SEPSIS)
1000.0000 mL | Freq: Once | INTRAVENOUS | Status: AC
  Administered 2013-06-27: 1000 mL via INTRAVENOUS

## 2013-06-26 NOTE — ED Provider Notes (Signed)
CSN: [id number]     Arrival date & time 06/26/13  2232 History  This chart was scribed for Rebekah KaplanAnkit Ezra Marquess, MD by Blanchard KelchNicole Curnes, ED Scribe. The patient was seen in room MH06/MH06. Patient's care was started at 11:44 PM.    Chief Complaint  Patient presents with  . Abdominal Pain     Patient is a 28 y.o. female presenting with abdominal pain. The history is provided by the patient. No language interpreter was used.  Abdominal Pain Associated symptoms: shortness of breath   Associated symptoms: no dysuria, no fever, no hematuria, no nausea, no vaginal bleeding, no vaginal discharge and no vomiting     HPI Comments: Rebekah Smith is a 28 y.o. female who presents to the Emergency Department complaining of intermittent, worsening suprapubic abdominal pain that began yesterday. The pain does not radiate. She describes the pain as shooting and cramping. The episodes of pain usually last for minutes before subsiding. She denies taking any medication for the pain. She reports associated shortness of breath for the past few days. She denies nausea, vomiting, fever, chills, vaginal discharge, vaginal bleeding, dysuria or hematuria. She states that she had a positive home pregnancy test two weeks ago. It has not been confirmed at a doctor visit. Her LNMP was at the end of February. This is her fourth pregnancy. She reports two full term children and one miscarriage. She denies a history of preeclampsia or hypertension. She denies a history of pelvic disorders.    Past Medical History  Diagnosis Date  . Blood transfusion    History reviewed. No pertinent past surgical history. Family History  Problem Relation Age of Onset  . Breast cancer Maternal Aunt   . Breast cancer Paternal Grandmother   . Cervical cancer Maternal Aunt   . Anxiety disorder Mother   . Hypertension Mother   . Stroke Maternal Grandmother    History  Substance Use Topics  . Smoking status: Never Smoker   . Smokeless  tobacco: Never Used  . Alcohol Use: No   OB History   Grav Para Term Preterm Abortions TAB SAB Ect Mult Living   4 3 3       3      Review of Systems  Constitutional: Negative for fever.  Respiratory: Positive for shortness of breath.   Gastrointestinal: Positive for abdominal pain. Negative for nausea and vomiting.  Genitourinary: Negative for dysuria, hematuria, vaginal bleeding and vaginal discharge.      Allergies  Review of patient's allergies indicates no known allergies.  Home Medications   Current Outpatient Rx  Name  Route  Sig  Dispense  Refill  . Prenatal Multivit-Min-Fe-FA (PRENATAL VITAMINS PO)   Oral   Take by mouth.         Marland Kitchen. HYDROcodone-acetaminophen (NORCO/VICODIN) 5-325 MG per tablet   Oral   Take 1-2 tablets by mouth every 6 (six) hours as needed (for pain).   30 tablet   0    Triage Vitals: BP 140/89  Pulse 114  Temp(Src) 98.3 F (36.8 C) (Oral)  Resp 20  Wt 153 lb (69.4 kg)  SpO2 100%  LMP 05/26/2013  Physical Exam  Nursing note and vitals reviewed. Constitutional: She is oriented to person, place, and time. She appears well-developed and well-nourished. No distress.  HENT:  Head: Normocephalic and atraumatic.  Eyes: EOM are normal.  Neck: Normal range of motion.  Cardiovascular: Regular rhythm.  Tachycardia present.   HR 102  Pulmonary/Chest: Effort normal. No respiratory distress.  She has no wheezes. She has no rhonchi. She has no rales.  Abdominal: Soft. There is no tenderness. There is no rebound and no guarding.  No peritoneal signs. No flank tenderness.   Musculoskeletal:  Trace pitting edema in lower extremities. No unilateral swelling or calf tenderness. Mild swelling and tenderness over right foot just inferior to medial malleoli.  Neurological: She is alert and oriented to person, place, and time.  Skin: Skin is warm and dry.  Psychiatric: She has a normal mood and affect.    ED Course  Procedures (including critical care  time)  DIAGNOSTIC STUDIES: Oxygen Saturation is 100% on room air, normal by my interpretation.    COORDINATION OF CARE: 11:48 PM - Patient verbalizes understanding and agrees with treatment plan.    Labs Review Labs Reviewed  URINALYSIS, ROUTINE W REFLEX MICROSCOPIC - Abnormal; Notable for the following:    APPearance CLOUDY (*)    All other components within normal limits  PREGNANCY, URINE - Abnormal; Notable for the following:    Preg Test, Ur POSITIVE (*)    All other components within normal limits   Imaging Review No results found.   EKG Interpretation None      MDM   Final diagnoses:  None    I personally performed the services described in this documentation, which was scribed in my presence. The recorded information has been reviewed and is accurate.  Pt comes in with cc of suprapubic pain. [id number].. Cramping, atypical. n vaginal discharge, bleeding, uti like sx, STD hx. Ua is clear - no leukocytes even. Abd exam reveals mild tenderness, suprapubivc. Vitals are stable - but she is hypertensive for a pregnant woman, no proteinuria.  [place] rules out any evidence of ectopic. Repeat exam is unchanged.  Pt will be movingher OB appointment, for routine screening and evaluation of the BP. Advised to return to the ER if there is any bleeding, worsening pain, dizziness.  Rebekah Kaplan, MD 06/27/13 [phone]

## 2013-06-26 NOTE — ED Notes (Signed)
Pt reports having lower abdominal pain since yesterday.  Denies nausea, vomiting and diarrhea.  Pt reports she is pregnant. G4 P2 A1 (miscarriage)

## 2013-06-26 NOTE — ED Notes (Signed)
Abdominal pain on and off since yesterday. Crampy pain. No bleeding. She is a few weeks pregnant.

## 2013-06-27 ENCOUNTER — Emergency Department (HOSPITAL_BASED_OUTPATIENT_CLINIC_OR_DEPARTMENT_OTHER): Payer: Medicaid Other

## 2013-06-27 LAB — CBC WITH DIFFERENTIAL/PLATELET
Basophils Absolute: 0 10*3/uL (ref 0.0–0.1)
Basophils Relative: 1 % (ref 0–1)
Eosinophils Absolute: 0.1 10*3/uL (ref 0.0–0.7)
Eosinophils Relative: 1 % (ref 0–5)
HEMATOCRIT: 35.3 % — AB (ref 36.0–46.0)
Hemoglobin: 11.8 g/dL — ABNORMAL LOW (ref 12.0–15.0)
LYMPHS PCT: 29 % (ref 12–46)
Lymphs Abs: 2 10*3/uL (ref 0.7–4.0)
MCH: 28 pg (ref 26.0–34.0)
MCHC: 33.4 g/dL (ref 30.0–36.0)
MCV: 83.8 fL (ref 78.0–100.0)
MONO ABS: 0.8 10*3/uL (ref 0.1–1.0)
MONOS PCT: 11 % (ref 3–12)
NEUTROS ABS: 4 10*3/uL (ref 1.7–7.7)
Neutrophils Relative %: 58 % (ref 43–77)
Platelets: 250 10*3/uL (ref 150–400)
RBC: 4.21 MIL/uL (ref 3.87–5.11)
RDW: 13.1 % (ref 11.5–15.5)
WBC: 7 10*3/uL (ref 4.0–10.5)

## 2013-06-27 LAB — BASIC METABOLIC PANEL
BUN: 16 mg/dL (ref 6–23)
CHLORIDE: 102 meq/L (ref 96–112)
CO2: 25 mEq/L (ref 19–32)
CREATININE: 0.7 mg/dL (ref 0.50–1.10)
Calcium: 9.6 mg/dL (ref 8.4–10.5)
GFR calc Af Amer: >90 mL/min (ref >90)
GFR calc non Af Amer: >90 mL/min (ref >90)
Glucose, Bld: 104 mg/dL — ABNORMAL HIGH (ref 70–99)
Potassium: 3.8 mEq/L (ref 3.7–5.3)
Sodium: 140 mEq/L (ref 137–147)

## 2013-06-27 LAB — HCG, QUANTITATIVE, PREGNANCY: hCG, Beta Chain, Quant, S: 1666 m[IU]/mL — ABNORMAL HIGH (ref <5)

## 2013-06-27 MED ORDER — ACETAMINOPHEN 325 MG PO TABS: 650.0000 mg | ORAL_TABLET | Freq: Four times a day (QID) | ORAL | Status: DC | PRN

## 2013-06-27 NOTE — ED Notes (Signed)
MD at bedside discussing POC with patient.

## 2013-06-27 NOTE — Discharge Instructions (Signed)
Everything appeared normal on the Ultrasound, however, the test is limited since you are so early in the pregnancy. Please see the OB doctor in 1-2 weeks - given elevated BP and repeat OB evaluation. Return to the ER ([place], [facility]) if symptoms are worse.   Abdominal Pain During Pregnancy Abdominal pain is common in pregnancy. Most of the time, it does not cause harm. There are many causes of abdominal pain. Some causes are more serious than others. Some of the causes of abdominal pain in pregnancy are easily diagnosed. Occasionally, the diagnosis takes time to understand. Other times, the cause is not determined. Abdominal pain can be a sign that something is very wrong with the pregnancy, or the pain may have nothing to do with the pregnancy at all. For this reason, always tell your health care provider if you have any abdominal discomfort. HOME CARE INSTRUCTIONS  Monitor your abdominal pain for any changes. The following actions may help to alleviate any discomfort you are experiencing:  Do not have sexual intercourse or put anything in your vagina until your symptoms go away completely.  Get plenty of rest until your pain improves.  Drink clear fluids if you feel nauseous. Avoid solid food as long as you are uncomfortable or nauseous.  Only take over-the-counter or prescription medicine as directed by your health care provider.  Keep all follow-up appointments with your health care provider. SEEK IMMEDIATE MEDICAL CARE IF:  You are bleeding, leaking fluid, or passing tissue from the vagina.  You have increasing pain or cramping.  You have persistent vomiting.  You have painful or bloody urination.  You have a fever.  You notice a decrease in your baby's movements.  You have extreme weakness or feel faint.  You have shortness of breath, with or without abdominal pain.  You develop a severe headache with abdominal pain.  You have abnormal vaginal discharge with  abdominal pain.  You have persistent diarrhea.  You have abdominal pain that continues even after rest, or gets worse. MAKE SURE YOU:   Understand these instructions.  Will watch your condition.  Will get help right away if you are not doing well or get worse. Document Released: 03/19/2005 Document Revised: 01/07/2013 Document Reviewed: 10/16/2012 [facility] Patient Information 2014 [place].

## 2013-06-29 NOTE — Telephone Encounter (Signed)
Left message on voicemail to call and schedule appointment.Kalieb Freeland, Virgel BouquetGiovanna S

## 2013-06-30 ENCOUNTER — Ambulatory Visit: Payer: Medicaid Other | Admitting: Family Medicine

## 2013-08-25 ENCOUNTER — Ambulatory Visit (INDEPENDENT_AMBULATORY_CARE_PROVIDER_SITE_OTHER): Payer: Medicaid Other | Admitting: Family Medicine

## 2013-08-25 VITALS — BP 131/78 | HR 80 | Temp 99.0°F | Ht 67.0 in | Wt 147.0 lb

## 2013-08-25 DIAGNOSIS — R55 Syncope and collapse: Secondary | ICD-10-CM

## 2013-08-25 NOTE — Progress Notes (Signed)
Rebekah Smith is a 28 y.o. female who presents today for pre-syncopal events.  Pre-syncope - Pt states over the past several months she has had some pre-sycnopal events where she describes feeling lightheaded and her skin gets very white but does not actually have any syncopal episodes.  She does not describe any vertigo, dizziness, tinnitus, hearing loss, change of lightheadedness with position, denies any chest pain.  As well, she does notice occasional palpitations but denies these are related to her pre-syncopal events.  She denies any family history of cardiac disease or personal history including murmurs, structural heart disease, or SCD.  She denies any diaphoresis, dry mouth, or having these prior, no shortness of breath, nausea, vomiting, diarrhea, or lower extremity edema.   Past Medical History  Diagnosis Date  . Blood transfusion     History  Smoking status  . Never Smoker   Smokeless tobacco  . Never Used    Family History  Problem Relation Age of Onset  . Breast cancer Maternal Aunt   . Breast cancer Paternal Grandmother   . Cervical cancer Maternal Aunt   . Anxiety disorder Mother   . Hypertension Mother   . Stroke Maternal Grandmother     Current Outpatient Prescriptions on File Prior to Visit  Medication Sig Dispense Refill  . acetaminophen (TYLENOL) 325 MG tablet Take 2 tablets (650 mg total) by mouth every 6 (six) hours as needed.  30 tablet  1  . HYDROcodone-acetaminophen (NORCO/VICODIN) 5-325 MG per tablet Take 1-2 tablets by mouth every 6 (six) hours as needed (for pain).  30 tablet  0  . Prenatal Multivit-Min-Fe-FA (PRENATAL VITAMINS PO) Take by mouth.       No current facility-administered medications on file prior to visit.    ROS: Per HPI.  All other systems reviewed and are negative.   Physical Exam Filed Vitals:   08/25/13 1346 08/25/13 1347 08/25/13 1349 08/25/13 1424  BP: 126/69 128/76 131/78   Pulse: 79 84 102 80  Temp:      TempSrc:       Height:      Weight:        Physical Examination: General appearance - alert, well appearing, and in no distress Mouth - mucous membranes moist, pharynx normal without lesions Neck - No JVD Chest - clear to auscultation, no wheezes, rales or rhonchi, symmetric air entry Heart - normal rate and regular rhythm, no murmurs noted Extremities - peripheral pulses normal, no pedal edema, no clubbing or cyanosis Skin - normal coloration and turgor, no rashes, no suspicious skin lesions noted    Chemistry      Component Value Date/Time   NA 140 06/27/2013 0006   K 3.8 06/27/2013 0006   CL 102 06/27/2013 0006   CO2 25 06/27/2013 0006   BUN 16 06/27/2013 0006   CREATININE 0.70 06/27/2013 0006   CREATININE 0.57 09/04/2010 1305      Component Value Date/Time   CALCIUM 9.6 06/27/2013 0006   ALKPHOS 123* 09/05/2010 1024   AST 17 09/05/2010 1024   ALT 15 09/05/2010 1024   BILITOT 0.4 09/05/2010 1024      Lab Results  Component Value Date   WBC 7.0 06/27/2013   HGB 11.8* 06/27/2013   HCT 35.3* 06/27/2013   MCV 83.8 06/27/2013   PLT 250 06/27/2013

## 2013-08-25 NOTE — Patient Instructions (Addendum)
[name], it was nice seeing you today.  Please keep a journal of any events that are happening and what they are related to that you notice.  As well, please make a follow up appointment with Dr. Caleb Popp.  If you start to have chest pain during one of these events or lose consciousness, please call or go to the emergency room.   Thanks, Dr. Paulina Fusi

## 2013-08-25 NOTE — Assessment & Plan Note (Signed)
Pt presenting with pre-syncope with pretty classic vasovagal like Sx.  No family history or personal history of abnormal cardiac events or structural heart disease and not changed by position along with normal orthostatics today.  She may have a component of anxiety as well, would recommend GAD 7 at next visit if continues.  As well, discussed writing in journal what her events are related today and life stresses at that time.

## 2013-11-09 ENCOUNTER — Ambulatory Visit (INDEPENDENT_AMBULATORY_CARE_PROVIDER_SITE_OTHER): Payer: Medicaid Other | Admitting: Advanced Practice Midwife

## 2013-11-09 ENCOUNTER — Other Ambulatory Visit: Payer: Self-pay | Admitting: Advanced Practice Midwife

## 2013-11-09 ENCOUNTER — Encounter: Payer: Self-pay | Admitting: Advanced Practice Midwife

## 2013-11-09 VITALS — BP 139/88 | HR 104 | Wt 152.0 lb

## 2013-11-09 DIAGNOSIS — Z8759 Personal history of other complications of pregnancy, childbirth and the puerperium: Secondary | ICD-10-CM

## 2013-11-09 DIAGNOSIS — Z8742 Personal history of other diseases of the female genital tract: Secondary | ICD-10-CM

## 2013-11-09 DIAGNOSIS — O09299 Supervision of pregnancy with other poor reproductive or obstetric history, unspecified trimester: Secondary | ICD-10-CM

## 2013-11-09 DIAGNOSIS — O09291 Supervision of pregnancy with other poor reproductive or obstetric history, first trimester: Secondary | ICD-10-CM

## 2013-11-09 DIAGNOSIS — Z3480 Encounter for supervision of other normal pregnancy, unspecified trimester: Secondary | ICD-10-CM

## 2013-11-09 DIAGNOSIS — Z3491 Encounter for supervision of normal pregnancy, unspecified, first trimester: Secondary | ICD-10-CM | POA: Insufficient documentation

## 2013-11-09 LAB — OB RESULTS CONSOLE GC/CHLAMYDIA
Chlamydia: NEGATIVE
GC PROBE AMP, GENITAL: NEGATIVE

## 2013-11-09 NOTE — Progress Notes (Signed)
   Subjective:    Rebekah Smith is a [id number] being seen today for her first obstetrical visit.  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 Patient does intend to breast feed. Pregnancy history fully reviewed.  Last Pap March 2015. Hx abn Pap>>Colpo.   Patient reports no bleeding and no cramping.  Filed Vitals:   11/09/13 0936  BP: 139/88  Pulse: 104  Weight: 152 lb (68.947 kg)    HISTORY: OB History  Gravida Para Term Preterm AB SAB TAB Ectopic Multiple Living  4 2 2  1 1    2     # Outcome Date GA Lbr Len/2nd Weight Sex Delivery Anes PTL Lv  4 CUR           3 SAB 06/09/13          2 TRM 09/01/10    M SVD EPI N Y  1 TRM 12/03/07 435w0d   M SVD EPI N Y     Past Medical History  Diagnosis Date  . Blood transfusion   . Hemorrhage   . Abnormal Pap smear of cervix     colposcopy   Past Surgical History  Procedure Laterality Date  . Tonsillectomy     Family History  Problem Relation Age of Onset  . Breast cancer Maternal Aunt   . Breast cancer Paternal Grandmother   . Cervical cancer Maternal Aunt   . Anxiety disorder Mother   . Hypertension Mother   . Stroke Maternal Grandmother      Exam    Uterus:   9.2 week SIUP FHR 173  Pelvic Exam:    Perineum: Deffered   Vulva: Deffered   Vagina:  Deffered   pH: Deffered              System: Breast:  normal appearance, no masses or tenderness   Skin: normal coloration and turgor, no rashes    Neurologic: oriented, normal, normal mood, gait normal; reflexes normal and symmetric   Extremities: normal strength, tone, and muscle mass   HEENT PERRLA and sclera clear, anicteric   Mouth/Teeth dental hygiene good   Neck supple and no masses   Cardiovascular: regular rate and rhythm, no murmurs or gallops   Respiratory:  appears well, vitals normal, no respiratory distress, acyanotic, normal RR, neck free of mass or lymphadenopathy, chest clear, no wheezing, crepitations, rhonchi, normal symmetric  air entry   Abdomen: soft, non-tender; bowel sounds normal; no masses,  no organomegaly and Fundus non-palpable   Urinary: Deffered      Assessment:    Pregnancy: [id number] Patient Active Problem List   Diagnosis Date Noted  . Supervision of normal pregnancy in first trimester 11/09/2013  . Pre-syncope 08/25/2013  . Palpitations 10/05/2011  . Menorrhagia 10/05/2011        Plan:     Initial labs drawn. Prenatal vitamins. Problem list reviewed and updated. Genetic Screening discussed First Screen: declined.  Ultrasound discussed; fetal survey: requested.  Follow up in 4 weeks. 75% of 30 min visit spent on counseling and coordination of care.  Plan active management of third stage.   Will consult w/ Attending RE: need for further hematology work-up. Request Pap and colpo records.  Rebekah Smith, Rebekah Smith 11/09/2013

## 2013-11-09 NOTE — Progress Notes (Signed)
Bedside U/S showed IUP with FHT of 173 BPM and CRL 9 w2d

## 2013-11-09 NOTE — Patient Instructions (Signed)
First Trimester of Pregnancy The first trimester of pregnancy is from week 1 until the end of week 12 (months 1 through 3). A week after a sperm fertilizes an egg, the egg will implant on the wall of the uterus. This embryo will begin to develop into a baby. Genes from you and your partner are forming the baby. The female genes determine whether the baby is a boy or a girl. At 6-8 weeks, the eyes and face are formed, and the heartbeat can be seen on ultrasound. At the end of 12 weeks, all the baby's organs are formed.  Now that you are pregnant, you will want to do everything you can to have a healthy baby. Two of the most important things are to get good prenatal care and to follow your health care provider's instructions. Prenatal care is all the medical care you receive before the baby's birth. This care will help prevent, find, and treat any problems during the pregnancy and childbirth. BODY CHANGES Your body goes through many changes during pregnancy. The changes vary from woman to woman.   You may gain or lose a couple of pounds at first.  You may feel sick to your stomach (nauseous) and throw up (vomit). If the vomiting is uncontrollable, call your health care provider.  You may tire easily.  You may develop headaches that can be relieved by medicines approved by your health care provider.  You may urinate more often. Painful urination may mean you have a bladder infection.  You may develop heartburn as a result of your pregnancy.  You may develop constipation because certain hormones are causing the muscles that push waste through your intestines to slow down.  You may develop hemorrhoids or swollen, bulging veins (varicose veins).  Your breasts may begin to grow larger and become tender. Your nipples may stick out more, and the tissue that surrounds them (areola) may become darker.  Your gums may bleed and may be sensitive to brushing and flossing.  Dark spots or blotches (chloasma,  mask of pregnancy) may develop on your face. This will likely fade after the baby is born.  Your menstrual periods will stop.  You may have a loss of appetite.  You may develop cravings for certain kinds of food.  You may have changes in your emotions from day to day, such as being excited to be pregnant or being concerned that something may go wrong with the pregnancy and baby.  You may have more vivid and strange dreams.  You may have changes in your hair. These can include thickening of your hair, rapid growth, and changes in texture. Some women also have hair loss during or after pregnancy, or hair that feels dry or thin. Your hair will most likely return to normal after your baby is born. WHAT TO EXPECT AT YOUR PRENATAL VISITS During a routine prenatal visit:  You will be weighed to make sure you and the baby are growing normally.  Your blood pressure will be taken.  Your abdomen will be measured to track your baby's growth.  The fetal heartbeat will be listened to starting around week 10 or 12 of your pregnancy.  Test results from any previous visits will be discussed. Your health care provider may ask you:  How you are feeling.  If you are feeling the baby move.  If you have had any abnormal symptoms, such as leaking fluid, bleeding, severe headaches, or abdominal cramping.  If you have any questions. Other tests   that may be performed during your first trimester include:  Blood tests to find your blood type and to check for the presence of any previous infections. They will also be used to check for low iron levels (anemia) and Rh antibodies. Later in the pregnancy, blood tests for diabetes will be done along with other tests if problems develop.  Urine tests to check for infections, diabetes, or protein in the urine.  An ultrasound to confirm the proper growth and development of the baby.  An amniocentesis to check for possible genetic problems.  Fetal screens for  spina bifida and Down syndrome.  You may need other tests to make sure you and the baby are doing well. HOME CARE INSTRUCTIONS  Medicines  Follow your health care provider's instructions regarding medicine use. Specific medicines may be either safe or unsafe to take during pregnancy.  Take your prenatal vitamins as directed.  If you develop constipation, try taking a stool softener if your health care provider approves. Diet  Eat regular, well-balanced meals. Choose a variety of foods, such as meat or vegetable-based protein, fish, milk and low-fat dairy products, vegetables, fruits, and whole grain breads and cereals. Your health care provider will help you determine the amount of weight gain that is right for you.  Avoid raw meat and uncooked cheese. These carry germs that can cause birth defects in the baby.  Eating four or five small meals rather than three large meals a day may help relieve nausea and vomiting. If you start to feel nauseous, eating a few soda crackers can be helpful. Drinking liquids between meals instead of during meals also seems to help nausea and vomiting.  If you develop constipation, eat more high-fiber foods, such as fresh vegetables or fruit and whole grains. Drink enough fluids to keep your urine clear or pale yellow. Activity and Exercise  Exercise only as directed by your health care provider. Exercising will help you:  Control your weight.  Stay in shape.  Be prepared for labor and delivery.  Experiencing pain or cramping in the lower abdomen or low back is a good sign that you should stop exercising. Check with your health care provider before continuing normal exercises.  Try to avoid standing for long periods of time. Move your legs often if you must stand in one place for a long time.  Avoid heavy lifting.  Wear low-heeled shoes, and practice good posture.  You may continue to have sex unless your health care provider directs you  otherwise. Relief of Pain or Discomfort  Wear a good support bra for breast tenderness.   Take warm sitz baths to soothe any pain or discomfort caused by hemorrhoids. Use hemorrhoid cream if your health care provider approves.   Rest with your legs elevated if you have leg cramps or low back pain.  If you develop varicose veins in your legs, wear support hose. Elevate your feet for 15 minutes, 3-4 times a day. Limit salt in your diet. Prenatal Care  Schedule your prenatal visits by the twelfth week of pregnancy. They are usually scheduled monthly at first, then more often in the last 2 months before delivery.  Write down your questions. Take them to your prenatal visits.  Keep all your prenatal visits as directed by your health care provider. Safety  Wear your seat belt at all times when driving.  Make a list of emergency phone numbers, including numbers for family, friends, the hospital, and police and fire departments. General Tips    Ask your health care provider for a referral to a local prenatal education class. Begin classes no later than at the beginning of month 6 of your pregnancy.  Ask for help if you have counseling or nutritional needs during pregnancy. Your health care provider can offer advice or refer you to specialists for help with various needs.  Do not use hot tubs, steam rooms, or saunas.  Do not douche or use tampons or scented sanitary pads.  Do not cross your legs for long periods of time.  Avoid cat litter boxes and soil used by cats. These carry germs that can cause birth defects in the baby and possibly loss of the fetus by miscarriage or stillbirth.  Avoid all smoking, herbs, alcohol, and medicines not prescribed by your health care provider. Chemicals in these affect the formation and growth of the baby.  Schedule a dentist appointment. At home, brush your teeth with a soft toothbrush and be gentle when you floss. SEEK MEDICAL CARE IF:   You have  dizziness.  You have mild pelvic cramps, pelvic pressure, or nagging pain in the abdominal area.  You have persistent nausea, vomiting, or diarrhea.  You have a bad smelling vaginal discharge.  You have pain with urination.  You notice increased swelling in your face, hands, legs, or ankles. SEEK IMMEDIATE MEDICAL CARE IF:   You have a fever.  You are leaking fluid from your vagina.  You have spotting or bleeding from your vagina.  You have severe abdominal cramping or pain.  You have rapid weight gain or loss.  You vomit blood or material that looks like coffee grounds.  You are exposed to German measles and have never had them.  You are exposed to fifth disease or chickenpox.  You develop a severe headache.  You have shortness of breath.  You have any kind of trauma, such as from a fall or a car accident. Document Released: 03/13/2001 Document Revised: 08/03/2013 Document Reviewed: 01/27/2013 Exit[employer] Patient Information 2015 [employer]. This information is not intended to replace advice given to you by your health care provider. Make sure you discuss any questions you have with your health care provider.  

## 2013-11-10 LAB — OBSTETRIC PANEL
ANTIBODY SCREEN: NEGATIVE
BASOS ABS: 0 10*3/uL (ref 0.0–0.1)
Basophils Relative: 0 % (ref 0–1)
EOS PCT: 1 % (ref 0–5)
Eosinophils Absolute: 0.1 10*3/uL (ref 0.0–0.7)
HCT: 33.1 % — ABNORMAL LOW (ref 36.0–46.0)
HEMOGLOBIN: 10.9 g/dL — AB (ref 12.0–15.0)
Hepatitis B Surface Ag: NEGATIVE
LYMPHS PCT: 17 % (ref 12–46)
Lymphs Abs: 1.3 10*3/uL (ref 0.7–4.0)
MCH: 24.5 pg — ABNORMAL LOW (ref 26.0–34.0)
MCHC: 32.9 g/dL (ref 30.0–36.0)
MCV: 74.4 fL — AB (ref 78.0–100.0)
MONO ABS: 0.7 10*3/uL (ref 0.1–1.0)
MONOS PCT: 9 % (ref 3–12)
Neutro Abs: 5.8 10*3/uL (ref 1.7–7.7)
Neutrophils Relative %: 73 % (ref 43–77)
Platelets: 235 10*3/uL (ref 150–400)
RBC: 4.45 MIL/uL (ref 3.87–5.11)
RDW: 16.2 % — AB (ref 11.5–15.5)
RH TYPE: POSITIVE
RUBELLA: 0.81 {index} (ref <0.90)
WBC: 7.9 10*3/uL (ref 4.0–10.5)

## 2013-11-10 LAB — HIV ANTIBODY (ROUTINE TESTING W REFLEX): HIV 1&2 Ab, 4th Generation: NONREACTIVE

## 2013-11-10 LAB — GC/CHLAMYDIA PROBE AMP
CT PROBE, AMP APTIMA: NEGATIVE
GC Probe RNA: NEGATIVE

## 2013-11-10 LAB — CULTURE, URINE COMPREHENSIVE
Colony Count: NO GROWTH
Organism ID, Bacteria: NO GROWTH

## 2013-11-16 DIAGNOSIS — Z8759 Personal history of other complications of pregnancy, childbirth and the puerperium: Secondary | ICD-10-CM | POA: Insufficient documentation

## 2013-11-16 DIAGNOSIS — Z862 Personal history of diseases of the blood and blood-forming organs and certain disorders involving the immune mechanism: Secondary | ICD-10-CM

## 2013-11-16 DIAGNOSIS — O09299 Supervision of pregnancy with other poor reproductive or obstetric history, unspecified trimester: Secondary | ICD-10-CM | POA: Insufficient documentation

## 2013-11-17 ENCOUNTER — Encounter: Payer: Self-pay | Admitting: Advanced Practice Midwife

## 2013-11-19 ENCOUNTER — Other Ambulatory Visit: Payer: Self-pay | Admitting: Advanced Practice Midwife

## 2013-11-19 DIAGNOSIS — O09299 Supervision of pregnancy with other poor reproductive or obstetric history, unspecified trimester: Secondary | ICD-10-CM

## 2013-11-19 NOTE — Progress Notes (Signed)
Hx PPH x 2, once requiring transfusion. VW testing per Dr. Penne LashLeggett.

## 2013-12-14 ENCOUNTER — Encounter: Payer: Self-pay | Admitting: Family

## 2013-12-14 ENCOUNTER — Ambulatory Visit (INDEPENDENT_AMBULATORY_CARE_PROVIDER_SITE_OTHER): Payer: Self-pay | Admitting: Family

## 2013-12-14 VITALS — BP 117/79 | HR 90 | Wt 154.0 lb

## 2013-12-14 DIAGNOSIS — Z3480 Encounter for supervision of other normal pregnancy, unspecified trimester: Secondary | ICD-10-CM

## 2013-12-14 DIAGNOSIS — Z3491 Encounter for supervision of normal pregnancy, unspecified, first trimester: Secondary | ICD-10-CM

## 2013-12-14 DIAGNOSIS — Z862 Personal history of diseases of the blood and blood-forming organs and certain disorders involving the immune mechanism: Secondary | ICD-10-CM

## 2013-12-14 DIAGNOSIS — Z8759 Personal history of other complications of pregnancy, childbirth and the puerperium: Secondary | ICD-10-CM

## 2013-12-14 NOTE — Progress Notes (Signed)
Reviewed lab tests (nml).  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  Plans for quad, will get at next visit with Von Willebrand testing (hx of PPH x 2).  Scheduled anatomy ultrasound in 5 weeks.

## 2014-01-11 ENCOUNTER — Encounter: Payer: Self-pay | Admitting: Obstetrics & Gynecology

## 2014-01-11 ENCOUNTER — Ambulatory Visit (INDEPENDENT_AMBULATORY_CARE_PROVIDER_SITE_OTHER): Payer: Medicaid Other | Admitting: Obstetrics & Gynecology

## 2014-01-11 VITALS — BP 140/87 | HR 100 | Wt 162.0 lb

## 2014-01-11 DIAGNOSIS — Z3492 Encounter for supervision of normal pregnancy, unspecified, second trimester: Secondary | ICD-10-CM

## 2014-01-11 NOTE — Progress Notes (Signed)
Pt c/o pelvic pain - pt states she has dizzy spells and feeling as though she will pass out. Sx relieved by cold wet cloth around neck. Mostly Sx happen at work and not at home.  Urine Dipstick shows Large blood and Trace Leuks - Will send for culture.

## 2014-01-13 LAB — CULTURE, URINE COMPREHENSIVE
Colony Count: NO GROWTH
ORGANISM ID, BACTERIA: NO GROWTH

## 2014-01-18 ENCOUNTER — Other Ambulatory Visit: Payer: Self-pay | Admitting: Family

## 2014-01-18 ENCOUNTER — Ambulatory Visit (HOSPITAL_COMMUNITY)
Admission: RE | Admit: 2014-01-18 | Discharge: 2014-01-18 | Disposition: A | Discharge status: Home / Self Care | Payer: Medicaid Other | Source: Ambulatory Visit | Attending: Family | Admitting: Family

## 2014-01-18 DIAGNOSIS — Z3689 Encounter for other specified antenatal screening: Secondary | ICD-10-CM

## 2014-01-18 DIAGNOSIS — Z36 Encounter for antenatal screening of mother: Secondary | ICD-10-CM | POA: Insufficient documentation

## 2014-01-18 DIAGNOSIS — O4410 Placenta previa with hemorrhage, unspecified trimester: Secondary | ICD-10-CM | POA: Insufficient documentation

## 2014-01-18 DIAGNOSIS — Z3491 Encounter for supervision of normal pregnancy, unspecified, first trimester: Secondary | ICD-10-CM

## 2014-01-18 DIAGNOSIS — Z3A00 Weeks of gestation of pregnancy not specified: Secondary | ICD-10-CM | POA: Insufficient documentation

## 2014-01-18 DIAGNOSIS — O4452 Low lying placenta with hemorrhage, second trimester: Secondary | ICD-10-CM

## 2014-01-25 ENCOUNTER — Encounter: Payer: Self-pay | Admitting: Obstetrics & Gynecology

## 2014-01-25 ENCOUNTER — Ambulatory Visit (INDEPENDENT_AMBULATORY_CARE_PROVIDER_SITE_OTHER): Payer: Self-pay | Admitting: Obstetrics & Gynecology

## 2014-01-25 VITALS — BP 142/85 | HR 95 | Wt 163.0 lb

## 2014-01-25 DIAGNOSIS — Z3481 Encounter for supervision of other normal pregnancy, first trimester: Secondary | ICD-10-CM

## 2014-01-25 DIAGNOSIS — Z23 Encounter for immunization: Secondary | ICD-10-CM

## 2014-01-25 DIAGNOSIS — Z3491 Encounter for supervision of normal pregnancy, unspecified, first trimester: Secondary | ICD-10-CM

## 2014-01-25 MED ORDER — SULFAMETHOXAZOLE-TMP DS 800-160 MG PO TABS: 1.0000 | ORAL_TABLET | Freq: Two times a day (BID) | ORAL | Status: DC

## 2014-01-25 NOTE — Addendum Note (Signed)
Addended by: Allie BossierVE, Darean Rote C on: 01/25/2014 02:17 PM   Modules accepted: Orders

## 2014-01-25 NOTE — Progress Notes (Signed)
Routine visit. Good FM. Reassurance given regarding dizzy spell at work. Repeat u/s for placental location and cord insertion. Quad screen, Von Willebrands panel and flu vaccine today.

## 2014-01-25 NOTE — Progress Notes (Signed)
Bactrim prescribed for UIT. Culture sent.

## 2014-01-26 ENCOUNTER — Encounter: Payer: Self-pay | Admitting: Family

## 2014-01-26 DIAGNOSIS — O4420 Partial placenta previa NOS or without hemorrhage, unspecified trimester: Secondary | ICD-10-CM | POA: Insufficient documentation

## 2014-02-01 ENCOUNTER — Encounter: Payer: Self-pay | Admitting: Obstetrics & Gynecology

## 2014-02-04 ENCOUNTER — Other Ambulatory Visit: Payer: Self-pay | Admitting: *Deleted

## 2014-02-04 DIAGNOSIS — Z3482 Encounter for supervision of other normal pregnancy, second trimester: Secondary | ICD-10-CM

## 2014-02-08 ENCOUNTER — Other Ambulatory Visit: Payer: Self-pay | Admitting: Obstetrics & Gynecology

## 2014-02-08 ENCOUNTER — Ambulatory Visit (HOSPITAL_COMMUNITY)
Admission: RE | Admit: 2014-02-08 | Discharge: 2014-02-08 | Disposition: A | Discharge status: Home / Self Care | Payer: Medicaid Other | Source: Ambulatory Visit | Attending: Obstetrics & Gynecology | Admitting: Obstetrics & Gynecology

## 2014-02-08 DIAGNOSIS — Z3482 Encounter for supervision of other normal pregnancy, second trimester: Secondary | ICD-10-CM

## 2014-02-08 DIAGNOSIS — O4402 Placenta previa specified as without hemorrhage, second trimester: Secondary | ICD-10-CM | POA: Diagnosis present

## 2014-02-08 DIAGNOSIS — Z3A22 22 weeks gestation of pregnancy: Secondary | ICD-10-CM | POA: Insufficient documentation

## 2014-02-08 DIAGNOSIS — O4452 Low lying placenta with hemorrhage, second trimester: Secondary | ICD-10-CM | POA: Insufficient documentation

## 2014-02-22 ENCOUNTER — Other Ambulatory Visit: Payer: Self-pay | Admitting: *Deleted

## 2014-02-22 ENCOUNTER — Encounter: Payer: Self-pay | Admitting: Advanced Practice Midwife

## 2014-02-22 ENCOUNTER — Ambulatory Visit (INDEPENDENT_AMBULATORY_CARE_PROVIDER_SITE_OTHER): Payer: Medicaid Other | Admitting: Advanced Practice Midwife

## 2014-02-22 VITALS — BP 139/87 | HR 99 | Wt 173.0 lb

## 2014-02-22 DIAGNOSIS — O4412 Placenta previa with hemorrhage, second trimester: Secondary | ICD-10-CM

## 2014-02-22 DIAGNOSIS — O4452 Low lying placenta with hemorrhage, second trimester: Secondary | ICD-10-CM

## 2014-02-22 DIAGNOSIS — O09291 Supervision of pregnancy with other poor reproductive or obstetric history, first trimester: Secondary | ICD-10-CM

## 2014-02-22 DIAGNOSIS — Z3491 Encounter for supervision of normal pregnancy, unspecified, first trimester: Secondary | ICD-10-CM

## 2014-02-22 DIAGNOSIS — O09292 Supervision of pregnancy with other poor reproductive or obstetric history, second trimester: Secondary | ICD-10-CM

## 2014-02-22 NOTE — Patient Instructions (Signed)
Second Trimester of Pregnancy The second trimester is from week 13 through week 28, months 4 through 6. The second trimester is often a time when you feel your best. Your body has also adjusted to being pregnant, and you begin to feel better physically. Usually, morning sickness has lessened or quit completely, you may have more energy, and you may have an increase in appetite. The second trimester is also a time when the fetus is growing rapidly. At the end of the sixth month, the fetus is about 9 inches long and weighs about 1 pounds. You will likely begin to feel the baby move (quickening) between 18 and 20 weeks of the pregnancy. BODY CHANGES Your body goes through many changes during pregnancy. The changes vary from woman to woman.   Your weight will continue to increase. You will notice your lower abdomen bulging out.  You may begin to get stretch marks on your hips, abdomen, and breasts.  You may develop headaches that can be relieved by medicines approved by your health care provider.  You may urinate more often because the fetus is pressing on your bladder.  You may develop or continue to have heartburn as a result of your pregnancy.  You may develop constipation because certain hormones are causing the muscles that push waste through your intestines to slow down.  You may develop hemorrhoids or swollen, bulging veins (varicose veins).  You may have back pain because of the weight gain and pregnancy hormones relaxing your joints between the bones in your pelvis and as a result of a shift in weight and the muscles that support your balance.  Your breasts will continue to grow and be tender.  Your gums may bleed and may be sensitive to brushing and flossing.  Dark spots or blotches (chloasma, mask of pregnancy) may develop on your face. This will likely fade after the baby is born.  A dark line from your belly button to the pubic area (linea nigra) may appear. This will likely fade  after the baby is born.  You may have changes in your hair. These can include thickening of your hair, rapid growth, and changes in texture. Some women also have hair loss during or after pregnancy, or hair that feels dry or thin. Your hair will most likely return to normal after your baby is born. WHAT TO EXPECT AT YOUR PRENATAL VISITS During a routine prenatal visit:  You will be weighed to make sure you and the fetus are growing normally.  Your blood pressure will be taken.  Your abdomen will be measured to track your baby's growth.  The fetal heartbeat will be listened to.  Any test results from the previous visit will be discussed. Your health care provider may ask you:  How you are feeling.  If you are feeling the baby move.  If you have had any abnormal symptoms, such as leaking fluid, bleeding, severe headaches, or abdominal cramping.  If you have any questions. Other tests that may be performed during your second trimester include:  Blood tests that check for:  Low iron levels (anemia).  Gestational diabetes (between 24 and 28 weeks).  Rh antibodies.  Urine tests to check for infections, diabetes, or protein in the urine.  An ultrasound to confirm the proper growth and development of the baby.  An amniocentesis to check for possible genetic problems.  Fetal screens for spina bifida and Down syndrome. HOME CARE INSTRUCTIONS   Avoid all smoking, herbs, alcohol, and unprescribed   drugs. These chemicals affect the formation and growth of the baby.  Follow your health care provider's instructions regarding medicine use. There are medicines that are either safe or unsafe to take during pregnancy.  Exercise only as directed by your health care provider. Experiencing uterine cramps is a good sign to stop exercising.  Continue to eat regular, healthy meals.  Wear a good support bra for breast tenderness.  Do not use hot tubs, steam rooms, or saunas.  Wear your  seat belt at all times when driving.  Avoid raw meat, uncooked cheese, cat litter boxes, and soil used by cats. These carry germs that can cause birth defects in the baby.  Take your prenatal vitamins.  Try taking a stool softener (if your health care provider approves) if you develop constipation. Eat more high-fiber foods, such as fresh vegetables or fruit and whole grains. Drink plenty of fluids to keep your urine clear or pale yellow.  Take warm sitz baths to soothe any pain or discomfort caused by hemorrhoids. Use hemorrhoid cream if your health care provider approves.  If you develop varicose veins, wear support hose. Elevate your feet for 15 minutes, 3-4 times a day. Limit salt in your diet.  Avoid heavy lifting, wear low heel shoes, and practice good posture.  Rest with your legs elevated if you have leg cramps or low back pain.  Visit your dentist if you have not gone yet during your pregnancy. Use a soft toothbrush to brush your teeth and be gentle when you floss.  A sexual relationship may be continued unless your health care provider directs you otherwise.  Continue to go to all your prenatal visits as directed by your health care provider. SEEK MEDICAL CARE IF:   You have dizziness.  You have mild pelvic cramps, pelvic pressure, or nagging pain in the abdominal area.  You have persistent nausea, vomiting, or diarrhea.  You have a bad smelling vaginal discharge.  You have pain with urination. SEEK IMMEDIATE MEDICAL CARE IF:   You have a fever.  You are leaking fluid from your vagina.  You have spotting or bleeding from your vagina.  You have severe abdominal cramping or pain.  You have rapid weight gain or loss.  You have shortness of breath with chest pain.  You notice sudden or extreme swelling of your face, hands, ankles, feet, or legs.  You have not felt your baby move in over an hour.  You have severe headaches that do not go away with  medicine.  You have vision changes. Document Released: 03/13/2001 Document Revised: 03/24/2013 Document Reviewed: 05/20/2012 [employer] Patient Information 2015 [employer]. This information is not intended to replace advice given to you by your health care provider. Make sure you discuss any questions you have with your health care provider.  

## 2014-02-22 NOTE — Progress Notes (Signed)
Reviewed F/U US. Placenta is no longer low lying and cord is centrally inserted  Has some lower pressure. Discussed belly band

## 2014-02-24 LAB — VON WILLEBRAND PANEL
COAGULATION FACTOR VIII: 96 % (ref 73–140)
Ristocetin Co-factor, Plasma: 119 % (ref 42–200)
VON WILLEBRAND ANTIGEN, PLASMA: 133 % (ref 50–217)

## 2014-03-11 ENCOUNTER — Encounter: Payer: Self-pay | Admitting: Obstetrics & Gynecology

## 2014-03-11 ENCOUNTER — Ambulatory Visit (INDEPENDENT_AMBULATORY_CARE_PROVIDER_SITE_OTHER): Payer: Medicaid Other | Admitting: Obstetrics & Gynecology

## 2014-03-11 VITALS — BP 143/91 | HR 92 | Wt 182.0 lb

## 2014-03-11 DIAGNOSIS — R102 Pelvic and perineal pain: Secondary | ICD-10-CM

## 2014-03-11 DIAGNOSIS — O9989 Other specified diseases and conditions complicating pregnancy, childbirth and the puerperium: Secondary | ICD-10-CM

## 2014-03-11 DIAGNOSIS — Z3481 Encounter for supervision of other normal pregnancy, first trimester: Secondary | ICD-10-CM

## 2014-03-11 DIAGNOSIS — R311 Benign essential microscopic hematuria: Secondary | ICD-10-CM

## 2014-03-11 DIAGNOSIS — O26899 Other specified pregnancy related conditions, unspecified trimester: Secondary | ICD-10-CM

## 2014-03-11 DIAGNOSIS — Z3491 Encounter for supervision of normal pregnancy, unspecified, first trimester: Secondary | ICD-10-CM

## 2014-03-11 NOTE — Progress Notes (Signed)
Work in visit. She is still having pelvic pain. Denies VB, ROM or contractions. Rest and a warm bath every night helps the pain. She did get the belly band. She denies h/o PTD. Reassurance given. PTL precautions reviewed.

## 2014-03-11 NOTE — Progress Notes (Signed)
Pt c/o extreme pelvic pain - Trace blood on dipstick

## 2014-03-13 LAB — CULTURE, URINE COMPREHENSIVE
COLONY COUNT: NO GROWTH
Organism ID, Bacteria: NO GROWTH

## 2014-03-22 ENCOUNTER — Ambulatory Visit (INDEPENDENT_AMBULATORY_CARE_PROVIDER_SITE_OTHER): Payer: Medicaid Other | Admitting: Family

## 2014-03-22 VITALS — BP 151/92 | HR 103 | Wt 182.0 lb

## 2014-03-22 DIAGNOSIS — Z3481 Encounter for supervision of other normal pregnancy, first trimester: Secondary | ICD-10-CM

## 2014-03-22 DIAGNOSIS — O09291 Supervision of pregnancy with other poor reproductive or obstetric history, first trimester: Secondary | ICD-10-CM

## 2014-03-22 DIAGNOSIS — Z3491 Encounter for supervision of normal pregnancy, unspecified, first trimester: Secondary | ICD-10-CM

## 2014-03-22 DIAGNOSIS — O133 Gestational [pregnancy-induced] hypertension without significant proteinuria, third trimester: Secondary | ICD-10-CM

## 2014-03-22 MED ORDER — AZITHROMYCIN 250 MG PO TABS: ORAL_TABLET | ORAL | Status: DC

## 2014-03-22 NOTE — Progress Notes (Signed)
Patient wishes to come back on another day to do her glucose testing as she has all three of her children with her today and another appointment to get to after she finishes here.

## 2014-03-22 NOTE — Addendum Note (Signed)
Addended by: Marlis EdelsonKARIM, Geraldo Haris N on: 03/22/2014 03:26 PM   Modules accepted: Orders

## 2014-03-22 NOTE — Progress Notes (Signed)
Reports increased coughing up phleghm for the past week.  Denies fever, body aches, or chills.  Lungs clear; tender frontal sinuses.  RX Z-pak.  Denies headache, vision changes, or epigastric pain.  Obtain baseline labs.  BPP this week.

## 2014-03-23 LAB — PROTEIN / CREATININE RATIO, URINE
Creatinine, Urine: 94.3 mg/dL
Protein Creatinine Ratio: 0.18 — ABNORMAL HIGH (ref <0.15)
TOTAL PROTEIN, URINE: 17 mg/dL (ref 5–24)

## 2014-03-24 ENCOUNTER — Other Ambulatory Visit: Payer: Medicaid Other

## 2014-03-24 ENCOUNTER — Ambulatory Visit (HOSPITAL_COMMUNITY)
Admission: RE | Admit: 2014-03-24 | Discharge: 2014-03-24 | Disposition: A | Discharge status: Home / Self Care | Payer: Medicaid Other | Source: Ambulatory Visit | Attending: Family | Admitting: Family

## 2014-03-24 DIAGNOSIS — O133 Gestational [pregnancy-induced] hypertension without significant proteinuria, third trimester: Secondary | ICD-10-CM

## 2014-03-25 DIAGNOSIS — O133 Gestational [pregnancy-induced] hypertension without significant proteinuria, third trimester: Secondary | ICD-10-CM | POA: Insufficient documentation

## 2014-03-25 DIAGNOSIS — Z3A28 28 weeks gestation of pregnancy: Secondary | ICD-10-CM | POA: Insufficient documentation

## 2014-03-25 LAB — CREATININE CLEARANCE, URINE, 24 HOUR
CREATININE 24H UR: 1843 mg/d — AB (ref 700–1800)
Creatinine Clearance: 261 mL/min — ABNORMAL HIGH (ref 75–115)
Creatinine, Urine: 136.5 mg/dL
Creatinine: 0.49 mg/dL — ABNORMAL LOW (ref 0.50–1.10)

## 2014-03-25 LAB — CBC
HCT: 28.7 % — ABNORMAL LOW (ref 36.0–46.0)
Hemoglobin: 9.5 g/dL — ABNORMAL LOW (ref 12.0–15.0)
MCH: 25.5 pg — ABNORMAL LOW (ref 26.0–34.0)
MCHC: 33.1 g/dL (ref 30.0–36.0)
MCV: 77.2 fL — ABNORMAL LOW (ref 78.0–100.0)
MPV: 10.4 fL (ref 9.4–12.4)
Platelets: 197 10*3/uL (ref 150–400)
RBC: 3.72 MIL/uL — ABNORMAL LOW (ref 3.87–5.11)
RDW: 14.2 % (ref 11.5–15.5)
WBC: 6.8 10*3/uL (ref 4.0–10.5)

## 2014-03-25 LAB — COMPREHENSIVE METABOLIC PANEL
ALT: <8 U/L (ref 0–35)
AST: 12 U/L (ref 0–37)
Albumin: 3.2 g/dL — ABNORMAL LOW (ref 3.5–5.2)
Alkaline Phosphatase: 58 U/L (ref 39–117)
BUN: 6 mg/dL (ref 6–23)
CHLORIDE: 106 meq/L (ref 96–112)
CO2: 22 mEq/L (ref 19–32)
CREATININE: 0.49 mg/dL — AB (ref 0.50–1.10)
Calcium: 8.1 mg/dL — ABNORMAL LOW (ref 8.4–10.5)
Glucose, Bld: 91 mg/dL (ref 70–99)
Potassium: 3.6 mEq/L (ref 3.5–5.3)
Sodium: 138 mEq/L (ref 135–145)
Total Bilirubin: 0.3 mg/dL (ref 0.2–1.2)
Total Protein: 5.5 g/dL — ABNORMAL LOW (ref 6.0–8.3)

## 2014-03-25 LAB — PROTEIN, URINE, 24 HOUR
PROTEIN 24H UR: 243 mg/d — AB (ref <150)
Protein, Urine: 18 mg/dL (ref 5–24)

## 2014-03-29 ENCOUNTER — Encounter: Payer: Self-pay | Admitting: Obstetrics & Gynecology

## 2014-03-29 ENCOUNTER — Ambulatory Visit (INDEPENDENT_AMBULATORY_CARE_PROVIDER_SITE_OTHER): Payer: Medicaid Other | Admitting: Obstetrics & Gynecology

## 2014-03-29 VITALS — BP 132/79 | HR 79 | Wt 183.0 lb

## 2014-03-29 DIAGNOSIS — Z3481 Encounter for supervision of other normal pregnancy, first trimester: Secondary | ICD-10-CM

## 2014-03-29 DIAGNOSIS — Z3493 Encounter for supervision of normal pregnancy, unspecified, third trimester: Secondary | ICD-10-CM

## 2014-03-29 DIAGNOSIS — Z3491 Encounter for supervision of normal pregnancy, unspecified, first trimester: Secondary | ICD-10-CM

## 2014-03-29 DIAGNOSIS — Z23 Encounter for immunization: Secondary | ICD-10-CM

## 2014-03-29 DIAGNOSIS — R319 Hematuria, unspecified: Secondary | ICD-10-CM

## 2014-03-29 NOTE — Progress Notes (Signed)
Routine visit. Good FM. No problems. Glucola, TDAP, labs today.

## 2014-03-29 NOTE — Progress Notes (Signed)
Urine-mod blood will send for culture

## 2014-03-30 ENCOUNTER — Telehealth: Payer: Self-pay | Admitting: *Deleted

## 2014-03-30 LAB — CBC
HEMATOCRIT: 27.6 % — AB (ref 36.0–46.0)
HEMOGLOBIN: 9.1 g/dL — AB (ref 12.0–15.0)
MCH: 25.9 pg — AB (ref 26.0–34.0)
MCHC: 33 g/dL (ref 30.0–36.0)
MCV: 78.6 fL (ref 78.0–100.0)
MPV: 10.5 fL (ref 9.4–12.4)
Platelets: 196 10*3/uL (ref 150–400)
RBC: 3.51 MIL/uL — ABNORMAL LOW (ref 3.87–5.11)
RDW: 14 % (ref 11.5–15.5)
WBC: 7 10*3/uL (ref 4.0–10.5)

## 2014-03-30 LAB — GLUCOSE TOLERANCE, 1 HOUR (50G) W/O FASTING: GLUCOSE 1 HOUR GTT: 74 mg/dL (ref 70–140)

## 2014-03-30 LAB — HIV ANTIBODY (ROUTINE TESTING W REFLEX): HIV 1&2 Ab, 4th Generation: NONREACTIVE

## 2014-03-30 LAB — RPR

## 2014-03-30 NOTE — Telephone Encounter (Signed)
Pt notified of normal 1 hour GTT 

## 2014-03-31 LAB — CULTURE, URINE COMPREHENSIVE
Colony Count: NO GROWTH
ORGANISM ID, BACTERIA: NO GROWTH

## 2014-04-02 NOTE — L&D Delivery Note (Signed)
Patient is 29 y.o. [id number] 3467w6d admitted for IOL 2/2 preEclampsia with severe features, currently on magnesium, hx of PPH x2 requiring blood transfusion, BMZ given 1/22-23 during admission for evaluation of preE.   Delivery Note At 4:06 PM a viable female was delivered via Vaginal, Spontaneous Delivery (Presentation: Right Occiput Anterior).  APGAR: , ; weight 6 lb 2.1 oz (2780 g).   Placenta status: Intact, Spontaneous with likely abruption noted.  Cord:  with the following complications: pt given 1000mcg cytotec PV and 25mg  hemabate IM 2/2 bleeding and hx of PPH.  With vigorous bimanual bleeding returns to normal, however shortly thereafter became boggy and bleeding increased quickly.  Therefore bimanual massage done for about 10minutes, bleeding normal, uterus remained firm.  Anesthesia:  epidural Episiotomy:  none Lacerations:  none Suture Repair: n/a Est. Blood Loss (mL): 500  Mom to AICU.  Baby to NICU 2/2 GA.  Rebekah Smith ROCIO 04/30/2014, 4:41 PM

## 2014-04-09 ENCOUNTER — Inpatient Hospital Stay (HOSPITAL_COMMUNITY)
Admission: AD | Admit: 2014-04-09 | Discharge: 2014-04-09 | Disposition: A | Discharge status: Home / Self Care | Payer: Medicaid Other | Source: Ambulatory Visit | Attending: Obstetrics & Gynecology | Admitting: Obstetrics & Gynecology

## 2014-04-09 ENCOUNTER — Encounter (HOSPITAL_COMMUNITY): Payer: Self-pay | Admitting: *Deleted

## 2014-04-09 DIAGNOSIS — Z3A30 30 weeks gestation of pregnancy: Secondary | ICD-10-CM | POA: Insufficient documentation

## 2014-04-09 DIAGNOSIS — O133 Gestational [pregnancy-induced] hypertension without significant proteinuria, third trimester: Secondary | ICD-10-CM | POA: Insufficient documentation

## 2014-04-09 HISTORY — DX: Anemia, unspecified: D64.9

## 2014-04-09 LAB — COMPREHENSIVE METABOLIC PANEL
ALT: 10 U/L (ref 0–35)
AST: 19 U/L (ref 0–37)
Albumin: 2.7 g/dL — ABNORMAL LOW (ref 3.5–5.2)
Alkaline Phosphatase: 58 U/L (ref 39–117)
Anion gap: 7 (ref 5–15)
BUN: 6 mg/dL (ref 6–23)
CO2: 23 mmol/L (ref 19–32)
CREATININE: 0.49 mg/dL — AB (ref 0.50–1.10)
Calcium: 8.3 mg/dL — ABNORMAL LOW (ref 8.4–10.5)
Chloride: 107 mEq/L (ref 96–112)
GFR calc Af Amer: >90 mL/min (ref >90)
GFR calc non Af Amer: >90 mL/min (ref >90)
Glucose, Bld: 94 mg/dL (ref 70–99)
POTASSIUM: 3 mmol/L — AB (ref 3.5–5.1)
Sodium: 137 mmol/L (ref 135–145)
TOTAL PROTEIN: 5.3 g/dL — AB (ref 6.0–8.3)
Total Bilirubin: 0.4 mg/dL (ref 0.3–1.2)

## 2014-04-09 LAB — URINE MICROSCOPIC-ADD ON

## 2014-04-09 LAB — PROTEIN / CREATININE RATIO, URINE
Creatinine, Urine: 143 mg/dL
Protein Creatinine Ratio: 0.14 (ref 0.00–0.15)
TOTAL PROTEIN, URINE: 20 mg/dL

## 2014-04-09 LAB — URINALYSIS, ROUTINE W REFLEX MICROSCOPIC
Bilirubin Urine: NEGATIVE
GLUCOSE, UA: NEGATIVE mg/dL
Ketones, ur: 15 mg/dL — AB
Nitrite: NEGATIVE
Protein, ur: NEGATIVE mg/dL
Specific Gravity, Urine: 1.02 (ref 1.005–1.030)
UROBILINOGEN UA: 0.2 mg/dL (ref 0.0–1.0)
pH: 6 (ref 5.0–8.0)

## 2014-04-09 LAB — CBC
HCT: 27.5 % — ABNORMAL LOW (ref 36.0–46.0)
HEMOGLOBIN: 8.9 g/dL — AB (ref 12.0–15.0)
MCH: 26.3 pg (ref 26.0–34.0)
MCHC: 32.4 g/dL (ref 30.0–36.0)
MCV: 81.4 fL (ref 78.0–100.0)
Platelets: 136 10*3/uL — ABNORMAL LOW (ref 150–400)
RBC: 3.38 MIL/uL — ABNORMAL LOW (ref 3.87–5.11)
RDW: 14.4 % (ref 11.5–15.5)
WBC: 8.1 10*3/uL (ref 4.0–10.5)

## 2014-04-09 MED ORDER — BUTALBITAL-APAP-CAFFEINE 50-325-40 MG PO TABS
2.0000 | ORAL_TABLET | Freq: Once | ORAL | Status: AC
  Administered 2014-04-09: 2 via ORAL
  Filled 2014-04-09: qty 2

## 2014-04-09 NOTE — MAU Note (Signed)
Report given to  Sarah, RN.

## 2014-04-09 NOTE — MAU Note (Signed)
Called lab to draw CBC/CMP

## 2014-04-09 NOTE — MAU Provider Note (Signed)
None     Chief Complaint:  Hypertension   Rebekah Smith is  29 y.o. [phone] at 1931w6d presents complaining of Hypertension She states that she woke up with a headache, checked her BP, and it was 145/105  She is labeled GHTN at this time, but probably CHTN d/t nearly all BP's higher than normal since her first PNV at 9 weeks. She had negative preeclampsia labs 2 weeks ago.   Obstetrical/Gynecological History: OB History    Gravida Para Term Preterm AB TAB SAB Ectopic Multiple Living   4 2 2  1  1   2      Past Medical History: Past Medical History  Diagnosis Date  . Blood transfusion   . Hemorrhage   . Abnormal Pap smear of cervix     colposcopy  . Anemia   . Hypertension     Past Surgical History: Past Surgical History  Procedure Laterality Date  . Tonsillectomy    . Tonsillectomy      Family History: Family History  Problem Relation Age of Onset  . Breast cancer Maternal Aunt   . Breast cancer Paternal Grandmother   . Cervical cancer Maternal Aunt   . Anxiety disorder Mother   . Hypertension Mother   . Stroke Maternal Grandmother     Social History: History  Substance Use Topics  . Smoking status: Never Smoker   . Smokeless tobacco: Never Used  . Alcohol Use: No    Allergies: No Known Allergies  Meds:  No prescriptions prior to admission    Review of Systems   Constitutional: Negative for fever and chills Eyes: Negative for visual disturbances Respiratory: Negative for shortness of breath, dyspnea Cardiovascular: Negative for chest pain or palpitations  Gastrointestinal: Negative for vomiting, diarrhea and constipation Genitourinary: Negative for dysuria and urgency Musculoskeletal: Negative for back pain, joint pain, myalgias  Neurological: Negative for dizziness and headaches     Physical Exam  Blood pressure 126/72, pulse 72, temperature 98.6 F (37 C), temperature source Oral, resp. rate 16, height 5\' 7"  (1.702 m), weight 83.825 kg  (184 lb 12.8 oz), SpO2 100 %. GENERAL: Well-developed, well-nourished female in no acute distress.  LUNGS: Clear to auscultation bilaterally.  HEART: Regular rate and rhythm. ABDOMEN: Soft, nontender, nondistended, gravid.  EXTREMITIES: Nontender, no edema, 2+ distal pulses. DTR's 1-2+ FHT:  Baseline rate 140 bpm   Variability moderate  Accelerations present   Decelerations none Contractions: Every 0 mins   Labs: PLTS 135 PR/CR ration 0.18 LFT, creatine normal   Assessment: Rebekah Smith is  29 y.o. [id number] at 1131w6d presents with Gestational vs Chronic HTN, no evidence of preeclampsia.  Headache relieved with fioricet..  Plan: Growth US ordered for outpt Has appt Monday with OB.  Plan twice weekly testing starting 32 weeks.    CRESENZO-DISHMAN,Elion Hocker 1/9/20162:21 PM

## 2014-04-09 NOTE — MAU Note (Signed)
Had headache tonight so took BP at home, states highest reading was 145/105. Headache started at midnight. Denies abdominal pain/LOF/vaginal bleeding. Positive fetal movement. States has had elevated BP in office 3x during this pregnancy.

## 2014-04-12 ENCOUNTER — Inpatient Hospital Stay (HOSPITAL_COMMUNITY)
Admission: AD | Admit: 2014-04-12 | Discharge: 2014-04-12 | Disposition: A | Discharge status: Home / Self Care | Payer: Medicaid Other | Source: Ambulatory Visit | Attending: Family Medicine | Admitting: Family Medicine

## 2014-04-12 ENCOUNTER — Ambulatory Visit (HOSPITAL_COMMUNITY)
Admission: RE | Admit: 2014-04-12 | Discharge: 2014-04-12 | Disposition: A | Discharge status: Home / Self Care | Payer: Medicaid Other | Source: Ambulatory Visit | Attending: Obstetrics & Gynecology | Admitting: Obstetrics & Gynecology

## 2014-04-12 ENCOUNTER — Ambulatory Visit (INDEPENDENT_AMBULATORY_CARE_PROVIDER_SITE_OTHER): Payer: Medicaid Other | Admitting: Obstetrics & Gynecology

## 2014-04-12 VITALS — BP 139/86 | HR 85 | Wt 184.0 lb

## 2014-04-12 DIAGNOSIS — Z3481 Encounter for supervision of other normal pregnancy, first trimester: Secondary | ICD-10-CM

## 2014-04-12 DIAGNOSIS — O10919 Unspecified pre-existing hypertension complicating pregnancy, unspecified trimester: Secondary | ICD-10-CM | POA: Insufficient documentation

## 2014-04-12 DIAGNOSIS — Z3A31 31 weeks gestation of pregnancy: Secondary | ICD-10-CM | POA: Insufficient documentation

## 2014-04-12 DIAGNOSIS — O10913 Unspecified pre-existing hypertension complicating pregnancy, third trimester: Secondary | ICD-10-CM

## 2014-04-12 DIAGNOSIS — O10013 Pre-existing essential hypertension complicating pregnancy, third trimester: Secondary | ICD-10-CM | POA: Insufficient documentation

## 2014-04-12 DIAGNOSIS — O368130 Decreased fetal movements, third trimester, not applicable or unspecified: Secondary | ICD-10-CM | POA: Diagnosis present

## 2014-04-12 DIAGNOSIS — Z3491 Encounter for supervision of normal pregnancy, unspecified, first trimester: Secondary | ICD-10-CM

## 2014-04-12 DIAGNOSIS — O133 Gestational [pregnancy-induced] hypertension without significant proteinuria, third trimester: Secondary | ICD-10-CM

## 2014-04-12 DIAGNOSIS — O368131 Decreased fetal movements, third trimester, fetus 1: Secondary | ICD-10-CM

## 2014-04-12 NOTE — Progress Notes (Signed)
Chronic hypertension well controlled.  Start 2x week testing.  Serial growth USs.  BPP today.

## 2014-04-12 NOTE — MAU Note (Signed)
Pt states had u/s BPP that was 6/8. Here for NST.

## 2014-04-12 NOTE — MAU Provider Note (Signed)
Patient seen in office for decreased fetal movement.  Had FHT at 115.  [facility]  NST reactive, so BPP/NST 8/10.  Will send home to follow up at scheduled times.  Warning signs discussed with patient.  Candelaria CelesteSTINSON, Yunique Dearcos JEHIEL 04/12/2014 6:31 PM

## 2014-04-12 NOTE — Discharge Instructions (Signed)

## 2014-04-12 NOTE — Progress Notes (Signed)
Pt states she was seen in MAU for chtn and needs twice weekly testing and induction at 39wks

## 2014-04-13 ENCOUNTER — Encounter: Payer: Self-pay | Admitting: *Deleted

## 2014-04-13 DIAGNOSIS — O133 Gestational [pregnancy-induced] hypertension without significant proteinuria, third trimester: Secondary | ICD-10-CM | POA: Insufficient documentation

## 2014-04-13 DIAGNOSIS — O10919 Unspecified pre-existing hypertension complicating pregnancy, unspecified trimester: Secondary | ICD-10-CM | POA: Insufficient documentation

## 2014-04-15 ENCOUNTER — Telehealth: Payer: Self-pay | Admitting: *Deleted

## 2014-04-15 ENCOUNTER — Ambulatory Visit (HOSPITAL_COMMUNITY)
Admission: RE | Admit: 2014-04-15 | Discharge: 2014-04-15 | Disposition: A | Discharge status: Home / Self Care | Payer: Medicaid Other | Source: Ambulatory Visit | Attending: Advanced Practice Midwife | Admitting: Advanced Practice Midwife

## 2014-04-15 DIAGNOSIS — O133 Gestational [pregnancy-induced] hypertension without significant proteinuria, third trimester: Secondary | ICD-10-CM | POA: Insufficient documentation

## 2014-04-15 DIAGNOSIS — Z3A31 31 weeks gestation of pregnancy: Secondary | ICD-10-CM | POA: Insufficient documentation

## 2014-04-15 NOTE — Telephone Encounter (Signed)
Attempted to call pt with U/S report but phone states that her voicemail has not been set up.  She needs to start bi-weekly NST with AFI.

## 2014-04-15 NOTE — Telephone Encounter (Signed)
-----   Message from Faith RogueAmanda Andrews Rash, LPN sent at 1/61/09601/14/2016  1:05 PM EST ----- Regarding: FW: Ultrasound results   ----- Message -----    From: Vivien Rotaachael H Small, Rad Tech    Sent: 04/15/2014  12:35 PM      To: [facility] Clinical Pool Subject: Ultrasound results                             We have just completed a 2nd or 3rd trimester outpatient ultrasound scheduled for a patient who was seen in MAU.  Please call the patient with the results.

## 2014-04-19 ENCOUNTER — Ambulatory Visit (INDEPENDENT_AMBULATORY_CARE_PROVIDER_SITE_OTHER): Payer: Medicaid Other | Admitting: *Deleted

## 2014-04-19 ENCOUNTER — Encounter: Payer: Self-pay | Admitting: *Deleted

## 2014-04-19 VITALS — BP 142/91 | HR 91 | Wt 189.0 lb

## 2014-04-19 DIAGNOSIS — O133 Gestational [pregnancy-induced] hypertension without significant proteinuria, third trimester: Secondary | ICD-10-CM

## 2014-04-19 NOTE — Progress Notes (Signed)
Pt here for NST.  She stated that she had a severe headache Saturday night and had blurred vision and BP was 170/104  She did not go to the hospital.  She states that she feels better today but her BP is still elevated.  Per Dr Marice Potterove, pt is to do a 24 hour urine with labs tomorrow.

## 2014-04-22 ENCOUNTER — Ambulatory Visit: Payer: Medicaid Other | Admitting: *Deleted

## 2014-04-22 ENCOUNTER — Inpatient Hospital Stay (HOSPITAL_COMMUNITY)
Admission: AD | Admit: 2014-04-22 | Discharge: 2014-04-25 | DRG: 781 | Disposition: A | Discharge status: Home / Self Care | Payer: Medicaid Other | Source: Ambulatory Visit | Attending: Obstetrics & Gynecology | Admitting: Obstetrics & Gynecology

## 2014-04-22 ENCOUNTER — Other Ambulatory Visit (INDEPENDENT_AMBULATORY_CARE_PROVIDER_SITE_OTHER): Payer: Medicaid Other

## 2014-04-22 ENCOUNTER — Encounter (HOSPITAL_COMMUNITY): Payer: Self-pay | Admitting: *Deleted

## 2014-04-22 VITALS — BP 147/98 | Temp 97.5°F

## 2014-04-22 DIAGNOSIS — O113 Pre-existing hypertension with pre-eclampsia, third trimester: Secondary | ICD-10-CM | POA: Diagnosis present

## 2014-04-22 DIAGNOSIS — Z823 Family history of stroke: Secondary | ICD-10-CM

## 2014-04-22 DIAGNOSIS — Z3491 Encounter for supervision of normal pregnancy, unspecified, first trimester: Secondary | ICD-10-CM

## 2014-04-22 DIAGNOSIS — Z3A32 32 weeks gestation of pregnancy: Secondary | ICD-10-CM | POA: Diagnosis present

## 2014-04-22 DIAGNOSIS — O99343 Other mental disorders complicating pregnancy, third trimester: Secondary | ICD-10-CM | POA: Diagnosis present

## 2014-04-22 DIAGNOSIS — O133 Gestational [pregnancy-induced] hypertension without significant proteinuria, third trimester: Secondary | ICD-10-CM

## 2014-04-22 DIAGNOSIS — O10919 Unspecified pre-existing hypertension complicating pregnancy, unspecified trimester: Secondary | ICD-10-CM | POA: Diagnosis present

## 2014-04-22 DIAGNOSIS — Z8249 Family history of ischemic heart disease and other diseases of the circulatory system: Secondary | ICD-10-CM

## 2014-04-22 DIAGNOSIS — O10013 Pre-existing essential hypertension complicating pregnancy, third trimester: Principal | ICD-10-CM | POA: Diagnosis present

## 2014-04-22 DIAGNOSIS — F419 Anxiety disorder, unspecified: Secondary | ICD-10-CM | POA: Diagnosis present

## 2014-04-22 DIAGNOSIS — O163 Unspecified maternal hypertension, third trimester: Secondary | ICD-10-CM | POA: Diagnosis present

## 2014-04-22 LAB — US OB FOLLOW UP

## 2014-04-22 LAB — COMPREHENSIVE METABOLIC PANEL
ALBUMIN: 3.1 g/dL — AB (ref 3.5–5.2)
ALK PHOS: 69 U/L (ref 39–117)
ALK PHOS: 74 U/L (ref 39–117)
ALT: 10 U/L (ref 0–35)
AST: 14 U/L (ref 0–37)
AST: 19 U/L (ref 0–37)
Albumin: 3.2 g/dL — ABNORMAL LOW (ref 3.5–5.2)
Anion gap: 7 (ref 5–15)
BILIRUBIN TOTAL: 0.3 mg/dL (ref 0.2–1.2)
BUN: 6 mg/dL (ref 6–23)
BUN: 5 mg/dL — ABNORMAL LOW (ref 6–23)
CALCIUM: 8.4 mg/dL (ref 8.4–10.5)
CO2: 24 mEq/L (ref 19–32)
CO2: 24 mmol/L (ref 19–32)
CREATININE: 0.48 mg/dL — AB (ref 0.50–1.10)
CREATININE: 0.49 mg/dL — AB (ref 0.50–1.10)
Calcium: 8.5 mg/dL (ref 8.4–10.5)
Chloride: 105 mEq/L (ref 96–112)
Chloride: 108 mEq/L (ref 96–112)
GFR calc Af Amer: >90 mL/min (ref >90)
GFR calc non Af Amer: >90 mL/min (ref >90)
Glucose, Bld: 80 mg/dL (ref 70–99)
Glucose, Bld: 89 mg/dL (ref 70–99)
POTASSIUM: 3.4 mmol/L — AB (ref 3.5–5.1)
Potassium: 3.6 mEq/L (ref 3.5–5.3)
Sodium: 140 mEq/L (ref 135–145)
Sodium: 139 mmol/L (ref 135–145)
TOTAL PROTEIN: 5.5 g/dL — AB (ref 6.0–8.3)
Total Bilirubin: 0.3 mg/dL (ref 0.3–1.2)
Total Protein: 5.5 g/dL — ABNORMAL LOW (ref 6.0–8.3)

## 2014-04-22 LAB — URINE MICROSCOPIC-ADD ON

## 2014-04-22 LAB — CBC
HCT: 27.6 % — ABNORMAL LOW (ref 36.0–46.0)
HCT: 31.2 % — ABNORMAL LOW (ref 36.0–46.0)
HEMOGLOBIN: 9.9 g/dL — AB (ref 12.0–15.0)
Hemoglobin: 9.2 g/dL — ABNORMAL LOW (ref 12.0–15.0)
MCH: 25.6 pg — ABNORMAL LOW (ref 26.0–34.0)
MCH: 25.8 pg — ABNORMAL LOW (ref 26.0–34.0)
MCHC: 33.3 g/dL (ref 30.0–36.0)
MCHC: 31.7 g/dL (ref 30.0–36.0)
MCV: 76.9 fL — ABNORMAL LOW (ref 78.0–100.0)
MCV: 81.3 fL (ref 78.0–100.0)
MPV: 10.7 fL (ref 8.6–12.4)
PLATELETS: 155 10*3/uL (ref 150–400)
Platelets: 161 10*3/uL (ref 150–400)
RBC: 3.59 MIL/uL — ABNORMAL LOW (ref 3.87–5.11)
RBC: 3.84 MIL/uL — AB (ref 3.87–5.11)
RDW: 14.9 % (ref 11.5–15.5)
RDW: 14.8 % (ref 11.5–15.5)
WBC: 6.2 10*3/uL (ref 4.0–10.5)
WBC: 7.8 10*3/uL (ref 4.0–10.5)

## 2014-04-22 LAB — URINALYSIS, ROUTINE W REFLEX MICROSCOPIC
Bilirubin Urine: NEGATIVE
Glucose, UA: NEGATIVE mg/dL
Hgb urine dipstick: NEGATIVE
Ketones, ur: NEGATIVE mg/dL
NITRITE: NEGATIVE
Protein, ur: NEGATIVE mg/dL
Specific Gravity, Urine: 1.02 (ref 1.005–1.030)
Urobilinogen, UA: 0.2 mg/dL (ref 0.0–1.0)
pH: 8 (ref 5.0–8.0)

## 2014-04-22 LAB — PROTEIN, URINE, 24 HOUR
PROTEIN, URINE: 16 mg/dL (ref 5–24)
Protein, 24H Urine: 288 mg/d — ABNORMAL HIGH (ref <150)

## 2014-04-22 LAB — CREATININE CLEARANCE, URINE, 24 HOUR
CREATININE, URINE: 90.8 mg/dL
CREATININE: 0.48 mg/dL — AB (ref 0.50–1.10)
Creatinine Clearance: 236 mL/min — ABNORMAL HIGH (ref 75–115)
Creatinine, 24H Ur: 1634 mg/d (ref 700–1800)

## 2014-04-22 LAB — URIC ACID: Uric Acid, Serum: 4.4 mg/dL (ref 2.4–7.0)

## 2014-04-22 LAB — PROTEIN / CREATININE RATIO, URINE
CREATININE, URINE: 48 mg/dL
Protein Creatinine Ratio: 0.25 — ABNORMAL HIGH (ref 0.00–0.15)
Total Protein, Urine: 12 mg/dL

## 2014-04-22 MED ORDER — PRENATAL MULTIVITAMIN CH
1.0000 | ORAL_TABLET | Freq: Every day | ORAL | Status: DC
  Administered 2014-04-23 – 2014-04-25 (×3): 1 via ORAL
  Filled 2014-04-22 (×3): qty 1

## 2014-04-22 MED ORDER — BUTALBITAL-APAP-CAFFEINE 50-325-40 MG PO TABS
1.0000 | ORAL_TABLET | ORAL | Status: DC | PRN
  Administered 2014-04-22: 1 via ORAL
  Filled 2014-04-22: qty 1

## 2014-04-22 MED ORDER — CALCIUM CARBONATE ANTACID 500 MG PO CHEW
2.0000 | CHEWABLE_TABLET | ORAL | Status: DC | PRN
  Filled 2014-04-22: qty 2

## 2014-04-22 MED ORDER — BUTALBITAL-APAP-CAFFEINE 50-325-40 MG PO TABS
1.0000 | ORAL_TABLET | ORAL | Status: DC | PRN
  Administered 2014-04-22: 2 via ORAL
  Administered 2014-04-23 (×2): 1 via ORAL
  Filled 2014-04-22: qty 1
  Filled 2014-04-22: qty 2
  Filled 2014-04-22: qty 1

## 2014-04-22 MED ORDER — ACETAMINOPHEN 325 MG PO TABS
650.0000 mg | ORAL_TABLET | ORAL | Status: DC | PRN
  Administered 2014-04-24: 650 mg via ORAL
  Filled 2014-04-22: qty 2

## 2014-04-22 MED ORDER — DOCUSATE SODIUM 100 MG PO CAPS
100.0000 mg | ORAL_CAPSULE | Freq: Every day | ORAL | Status: DC
  Administered 2014-04-23 – 2014-04-25 (×3): 100 mg via ORAL
  Filled 2014-04-22 (×5): qty 1

## 2014-04-22 NOTE — MAU Provider Note (Signed)
  History     CSN: [id number]  Arrival date and time: 04/22/14 1044   First Provider Initiated Contact with Patient 04/22/14 1130      Chief Complaint  Patient presents with  . Hypertension   Hypertension Associated symptoms include blurred vision and headaches.     29 yo G4P2012 at 1544w5d presents for elevated blood pressure.   Last night pt was not feeling well with bad headache, so checked bp. 172/119 last night at home. Lowest bp was 145/102. Went to see physician for regularly scheduled appointment. Blood pressure was still elevated.  Admits to constant headache behind eyes, blurred vision x1 week. Denies abdominal pain.  Denies LOF, contractions, bleeding, or decreased fetal activity.    Past Medical History  Diagnosis Date  . Blood transfusion   . Hemorrhage   . Abnormal Pap smear of cervix     colposcopy  . Anemia   . Hypertension     Past Surgical History  Procedure Laterality Date  . Tonsillectomy    . Tonsillectomy      Family History  Problem Relation Age of Onset  . Breast cancer Maternal Aunt   . Breast cancer Paternal Grandmother   . Cervical cancer Maternal Aunt   . Anxiety disorder Mother   . Hypertension Mother   . Stroke Maternal Grandmother     History  Substance Use Topics  . Smoking status: Never Smoker   . Smokeless tobacco: Never Used  . Alcohol Use: No    Allergies: No Known Allergies  Prescriptions prior to admission  Medication Sig Dispense Refill Last Dose  . acetaminophen (TYLENOL) 325 MG tablet Take 2 tablets (650 mg total) by mouth every 6 (six) hours as needed. 30 tablet 1 Past Month at Unknown time  . Prenatal Multivit-Min-Fe-FA (PRENATAL VITAMINS PO) Take by mouth.   04/21/2014 at Unknown time    Review of Systems  Eyes: Positive for blurred vision.  Gastrointestinal: Positive for vomiting and diarrhea. Negative for nausea and abdominal pain.  Neurological: Positive for dizziness and headaches.   Physical Exam    Blood pressure 140/99, pulse 99, temperature 98.6 F (37 C), temperature source Oral, resp. rate 18, height 5\' 7"  (1.702 m), weight 85.276 kg (188 lb).  Physical Exam  Vitals reviewed. Constitutional: She is oriented to person, place, and time.  Red, flushed face and some shallow breathing at rest.  HENT:  Head: Normocephalic and atraumatic.  Cardiovascular: Normal rate, regular rhythm and normal heart sounds.   Respiratory: Effort normal and breath sounds normal.  Neurological: She is alert and oriented to person, place, and time.    MAU Course  Procedures  MDM NST reassuring  Consulted with Dr. Debroah LoopArnold, reviewed HPI/med history/exam/labs > to MAU to evaluate patient   Assessment and Plan  29 yo female [id number] at 4444w5d with chronic HTN and history of gestational HTN in previous pregnancies presents with elevated blood pressure.  # elevated blood pressure in pregnancy with symptoms. Question whether this is preEclampsia on chronic hypertension or chronic hypertension alone. Clinical picture is clouded by visual changes and headache. Will admit for obs and monitor blood pressure.  - CMP, CBC wnl - protein/cr ratio 0.25 - tx headache   # dispo- admit for obs  Rolm BookbinderMoss, Amber 04/22/2014, 11:32 AM   I examined pt and agree with documentation above and resident plan of care. Eino FarberWalidah Kennith GainN Karim, CNM

## 2014-04-22 NOTE — Progress Notes (Signed)
BP recheck 154/103    Pt here for NST and AFI for Chtn in pregnancy.  She is very tearful today but denies any domestic issues at home.  She states that she had been up during the night vomiting and her headache continues.  Per patient her Bp overnight was 150/103.  Spoke with Dr Debroah LoopArnold today and had him review her PIH labs from 04/21/14.  She also states that her vision is blurry all the time now.  She states that she feels calm but doesn't understand why her BP won't come down if she is OK.  Pt has decided that she will go to MAU today to be checked out because she doesn't want to get caught off guard if the weather becomes inclement.  Pt is aware that she can always call 911 if an emergency.  NST and AFI are normal today.

## 2014-04-22 NOTE — H&P (Signed)
   29 yo G4P2012 at 486w5d presents for elevated blood pressure.   Last night pt was not feeling well with bad headache, so checked bp. 172/119 last night at home. Lowest bp was 145/102. Went to see physician for regularly scheduled appointment. Blood pressure was still elevated.  Advised by staff at office to come to MAU for further evaluation. Admits to constant headache behind eyes, blurred vision x1 week. Denies abdominal pain.  Denies LOF, contractions, bleeding, or decreased fetal activity.    Past Medical History  Diagnosis Date  . Blood transfusion   . Hemorrhage   . Abnormal Pap smear of cervix     colposcopy  . Anemia   . Hypertension     Past Surgical History  Procedure Laterality Date  . Tonsillectomy    . Tonsillectomy      Family History  Problem Relation Age of Onset  . Breast cancer Maternal Aunt   . Breast cancer Paternal Grandmother   . Cervical cancer Maternal Aunt   . Anxiety disorder Mother   . Hypertension Mother   . Stroke Maternal Grandmother     History  Substance Use Topics  . Smoking status: Never Smoker   . Smokeless tobacco: Never Used  . Alcohol Use: No    Allergies: No Known Allergies  Prescriptions prior to admission  Medication Sig Dispense Refill Last Dose  . acetaminophen (TYLENOL) 325 MG tablet Take 2 tablets (650 mg total) by mouth every 6 (six) hours as needed. 30 tablet 1 Past Month at Unknown time  . Prenatal Multivit-Min-Fe-FA (PRENATAL VITAMINS PO) Take by mouth.   04/21/2014 at Unknown time    Review of Systems  Eyes: Positive for blurred vision.  Gastrointestinal: Positive for vomiting and diarrhea. Negative for nausea and abdominal pain.  Neurological: Positive for dizziness and headaches.   Physical Exam   Blood pressure 140/99, pulse 99, temperature 98.6 F (37 C), temperature source Oral, resp. rate 18, height  5\' 7"  (1.702 m), weight 85.276 kg (188 lb).  Physical Exam  Vitals reviewed. Constitutional: She is oriented to person, place, and time.  Red, flushed face and some shallow breathing at rest.  HENT:  Head: Normocephalic and atraumatic.  Cardiovascular: Normal rate, regular rhythm and normal heart sounds.  Respiratory: Effort normal and breath sounds normal.  Neurological: She is alert and oriented to person, place, and time.    MAU Course  Procedures  MDM NST reassuring  Consulted with Dr. Debroah LoopArnold, reviewed HPI/med history/exam/labs > to MAU to evaluate patient   Assessment and Plan  29 yo female [id number] at 496w5d with chronic HTN and history of gestational HTN in previous pregnancies presents with elevated blood pressure.  # elevated blood pressure in pregnancy with symptoms. Question whether this is preEclampsia on chronic hypertension or chronic hypertension alone. Clinical picture is clouded by visual changes and headache. Will admit for obs and monitor blood pressure.  - CMP, CBC wnl - protein/cr ratio 0.25 - tx headache with fiorecet  # dispo- admit for obs  I examined pt and agree with documentation above and resident plan of care. Eino FarberWalidah Kennith GainN Karim, CNM

## 2014-04-22 NOTE — MAU Note (Signed)
Pt had elevated b/p at doctors visit today 140-150's/90-100. Sent in for further eval. Pt c/o headache

## 2014-04-23 LAB — COMPREHENSIVE METABOLIC PANEL
ALT: 9 U/L (ref 0–35)
ANION GAP: 8 (ref 5–15)
AST: 16 U/L (ref 0–37)
Albumin: 2.7 g/dL — ABNORMAL LOW (ref 3.5–5.2)
Alkaline Phosphatase: 65 U/L (ref 39–117)
BUN: 7 mg/dL (ref 6–23)
CO2: 23 mmol/L (ref 19–32)
Calcium: 8.6 mg/dL (ref 8.4–10.5)
Chloride: 108 mEq/L (ref 96–112)
Creatinine, Ser: 0.55 mg/dL (ref 0.50–1.10)
GFR calc Af Amer: >90 mL/min (ref >90)
GLUCOSE: 81 mg/dL (ref 70–99)
Potassium: 3.1 mmol/L — ABNORMAL LOW (ref 3.5–5.1)
SODIUM: 139 mmol/L (ref 135–145)
Total Bilirubin: 0.5 mg/dL (ref 0.3–1.2)
Total Protein: 5.2 g/dL — ABNORMAL LOW (ref 6.0–8.3)

## 2014-04-23 LAB — CBC
HCT: 27.7 % — ABNORMAL LOW (ref 36.0–46.0)
Hemoglobin: 8.9 g/dL — ABNORMAL LOW (ref 12.0–15.0)
MCH: 26.1 pg (ref 26.0–34.0)
MCHC: 32.1 g/dL (ref 30.0–36.0)
MCV: 81.2 fL (ref 78.0–100.0)
PLATELETS: 143 10*3/uL — AB (ref 150–400)
RBC: 3.41 MIL/uL — ABNORMAL LOW (ref 3.87–5.11)
RDW: 14.7 % (ref 11.5–15.5)
WBC: 6.9 10*3/uL (ref 4.0–10.5)

## 2014-04-23 MED ORDER — BETAMETHASONE SOD PHOS & ACET 6 (3-3) MG/ML IJ SUSP
12.0000 mg | Freq: Every morning | INTRAMUSCULAR | Status: AC
  Administered 2014-04-23 – 2014-04-24 (×2): 12 mg via INTRAMUSCULAR
  Filled 2014-04-23 (×2): qty 2

## 2014-04-23 MED ORDER — NIFEDIPINE 10 MG PO CAPS
10.0000 mg | ORAL_CAPSULE | ORAL | Status: DC | PRN
  Administered 2014-04-23 – 2014-04-24 (×3): 10 mg via ORAL
  Filled 2014-04-23 (×3): qty 1

## 2014-04-23 MED ORDER — LACTATED RINGERS IV SOLN
INTRAVENOUS | Status: DC
  Administered 2014-04-23 – 2014-04-25 (×3): via INTRAVENOUS

## 2014-04-23 MED ORDER — DIPHENHYDRAMINE HCL 50 MG/ML IJ SOLN
25.0000 mg | Freq: Once | INTRAMUSCULAR | Status: AC
  Administered 2014-04-23: 25 mg via INTRAVENOUS
  Filled 2014-04-23: qty 1

## 2014-04-23 MED ORDER — LACTATED RINGERS IV BOLUS (SEPSIS)
500.0000 mL | Freq: Once | INTRAVENOUS | Status: AC
  Administered 2014-04-23: 500 mL via INTRAVENOUS

## 2014-04-23 MED ORDER — HYDROMORPHONE HCL 2 MG PO TABS
2.0000 mg | ORAL_TABLET | Freq: Once | ORAL | Status: AC
  Administered 2014-04-23: 2 mg via ORAL
  Filled 2014-04-23: qty 1

## 2014-04-23 MED ORDER — PROMETHAZINE HCL 25 MG/ML IJ SOLN
25.0000 mg | Freq: Once | INTRAMUSCULAR | Status: AC
  Administered 2014-04-23: 25 mg via INTRAVENOUS
  Filled 2014-04-23: qty 1

## 2014-04-23 NOTE — Progress Notes (Signed)
FACULTY PRACTICE ANTEPARTUM(COMPREHENSIVE) NOTE  Rebekah Smith is a 29 y.o. [id number] at 6646w6d by early ultrasound who is admitted for gestational hypertension.   Fetal presentation is cephalic. Length of Stay:  1  Days  Subjective: C/O headache not relieved well  Patient reports the fetal movement as active. Patient reports uterine contraction  activity as none. Patient reports  vaginal bleeding as none. Patient describes fluid per vagina as None.  Vitals:  Blood pressure 145/95, pulse 92, temperature 98.4 F (36.9 C), temperature source Oral, resp. rate 18, height 5\' 7"  (1.702 m), weight 85.276 kg (188 lb). Physical Examination:  General appearance - alert, well appearing, and in no distress Heart - normal rate and regular rhythm Abdomen - soft, nontender, nondistended Fundal Height:  size equals dates Cervical Exam: Not evaluated.  Extremities: extremities normal, atraumatic, no cyanosis or edema and Homans sign is negative, no sign of DVT with DTRs 2+ bilaterally Membranes:intact  Fetal Monitoring:   Fetal Heart Rate A      Mode  -- [off] filed at 04/22/2014 2300    Baseline Rate (A)  125 bpm filed at 04/22/2014 2225    Variability  6-25 BPM filed at 04/22/2014 2225    Accelerations  15 x 15 filed at 04/22/2014 2225    Decelerations  None filed at 04/22/2014 2225      Labs:  Results for orders placed or performed during the hospital encounter of 04/22/14 (from the past 24 hour(s))  Urinalysis, Routine w reflex microscopic   Collection Time: 04/22/14 10:55 AM  Result Value Ref Range   Color, Urine YELLOW YELLOW   APPearance CLEAR CLEAR   Specific Gravity, Urine 1.020 1.005 - 1.030   pH 8.0 5.0 - 8.0   Glucose, UA NEGATIVE NEGATIVE mg/dL   Hgb urine dipstick NEGATIVE NEGATIVE   Bilirubin Urine NEGATIVE NEGATIVE   Ketones, ur NEGATIVE NEGATIVE mg/dL   Protein, ur NEGATIVE NEGATIVE mg/dL   Urobilinogen, UA 0.2 0.0 - 1.0 mg/dL   Nitrite NEGATIVE NEGATIVE    Leukocytes, UA SMALL (A) NEGATIVE  Protein / creatinine ratio, urine   Collection Time: 04/22/14 10:55 AM  Result Value Ref Range   Creatinine, Urine 48.00 mg/dL   Total Protein, Urine 12 mg/dL   Protein Creatinine Ratio 0.25 (H) 0.00 - 0.15  Urine microscopic-add on   Collection Time: 04/22/14 10:55 AM  Result Value Ref Range   Squamous Epithelial / LPF RARE RARE   WBC, UA 3-6 <3 WBC/hpf   Bacteria, UA RARE RARE  Comprehensive metabolic panel   Collection Time: 04/22/14 11:35 AM  Result Value Ref Range   Sodium 139 135 - 145 mmol/L   Potassium 3.4 (L) 3.5 - 5.1 mmol/L   Chloride 108 96 - 112 mEq/L   CO2 24 19 - 32 mmol/L   Glucose, Bld 89 70 - 99 mg/dL   BUN 5 (L) 6 - 23 mg/dL   Creatinine, Ser 4.540.49 (L) 0.50 - 1.10 mg/dL   Calcium 8.5 8.4 - 09.810.5 mg/dL   Total Protein 5.5 (L) 6.0 - 8.3 g/dL   Albumin 3.1 (L) 3.5 - 5.2 g/dL   AST 19 0 - 37 U/L   ALT 10 0 - 35 U/L   Alkaline Phosphatase 74 39 - 117 U/L   Total Bilirubin 0.3 0.3 - 1.2 mg/dL   GFR calc non Af Amer >90 >90 mL/min   GFR calc Af Amer >90 >90 mL/min   Anion gap 7 5 - 15  CBC  Collection Time: 04/22/14 11:35 AM  Result Value Ref Range   WBC 7.8 4.0 - 10.5 K/uL   RBC 3.84 (L) 3.87 - 5.11 MIL/uL   Hemoglobin 9.9 (L) 12.0 - 15.0 g/dL   HCT 16.1 (L) 09.6 - 04.5 %   MCV 81.3 78.0 - 100.0 fL   MCH 25.8 (L) 26.0 - 34.0 pg   MCHC 31.7 30.0 - 36.0 g/dL   RDW 40.9 81.1 - 91.4 %   Platelets 155 150 - 400 K/uL  CBC   Collection Time: 04/23/14  6:05 AM  Result Value Ref Range   WBC 6.9 4.0 - 10.5 K/uL   RBC 3.41 (L) 3.87 - 5.11 MIL/uL   Hemoglobin 8.9 (L) 12.0 - 15.0 g/dL   HCT 78.2 (L) 95.6 - 21.3 %   MCV 81.2 78.0 - 100.0 fL   MCH 26.1 26.0 - 34.0 pg   MCHC 32.1 30.0 - 36.0 g/dL   RDW 08.6 57.8 - 46.9 %   Platelets 143 (L) 150 - 400 K/uL      Medications:  Scheduled . docusate sodium  100 mg Oral Daily  . prenatal multivitamin  1 tablet Oral Q1200   I have reviewed the patient's current  medications.  ASSESSMENT: Patient Active Problem List   Diagnosis Date Noted  . Hypertension affecting pregnancy in third trimester, antepartum 04/22/2014  . [redacted] weeks gestation of pregnancy   . Gestational hypertension without significant proteinuria in third trimester   . Chronic hypertension in pregnancy 04/12/2014  . Low lying placenta with hemorrhage in second trimester, antepartum   . History of postpartum hemorrhage, currently pregnant in first trimester 11/16/2013  . History of gestational hypertension 11/16/2013  . Supervision of normal pregnancy in first trimester 11/09/2013  . Pre-syncope 08/25/2013  . Palpitations 10/05/2011  . Menorrhagia 10/05/2011    PLAN: Observation for S/Sx severe preeclampsia, betamethasone   Caliana Spires 04/23/2014,7:15 AM

## 2014-04-23 NOTE — Progress Notes (Signed)
Dr Penne LashLeggett notified of patient status, awaiting 24 hour urine results, FHr tracing, pt complaints of mod to severe H/A with tx of Fiuorcet with some relief with previous morning dose, maternal BPs, and patient reporting dizziness and floaters.

## 2014-04-23 NOTE — Plan of Care (Signed)
Problem: Phase I Progression Outcomes Goal: Pain controlled with appropriate interventions Outcome: Progressing Updated Dr Penne LashLeggett of patient's HA despite previous interventions of Fiourcet. Reported HA as new finding for a severe feature alongside with increased mat BPs. Will continue to monitor and assess for updated pain management orders.

## 2014-04-24 DIAGNOSIS — Z3A31 31 weeks gestation of pregnancy: Secondary | ICD-10-CM

## 2014-04-24 DIAGNOSIS — Z8249 Family history of ischemic heart disease and other diseases of the circulatory system: Secondary | ICD-10-CM | POA: Diagnosis not present

## 2014-04-24 DIAGNOSIS — O113 Pre-existing hypertension with pre-eclampsia, third trimester: Secondary | ICD-10-CM | POA: Diagnosis present

## 2014-04-24 DIAGNOSIS — F419 Anxiety disorder, unspecified: Secondary | ICD-10-CM | POA: Diagnosis present

## 2014-04-24 DIAGNOSIS — R030 Elevated blood-pressure reading, without diagnosis of hypertension: Secondary | ICD-10-CM | POA: Diagnosis present

## 2014-04-24 DIAGNOSIS — Z3A32 32 weeks gestation of pregnancy: Secondary | ICD-10-CM | POA: Diagnosis present

## 2014-04-24 DIAGNOSIS — O133 Gestational [pregnancy-induced] hypertension without significant proteinuria, third trimester: Secondary | ICD-10-CM

## 2014-04-24 DIAGNOSIS — O10013 Pre-existing essential hypertension complicating pregnancy, third trimester: Secondary | ICD-10-CM | POA: Diagnosis present

## 2014-04-24 DIAGNOSIS — O99343 Other mental disorders complicating pregnancy, third trimester: Secondary | ICD-10-CM | POA: Diagnosis present

## 2014-04-24 DIAGNOSIS — Z823 Family history of stroke: Secondary | ICD-10-CM | POA: Diagnosis not present

## 2014-04-24 LAB — PROTEIN, URINE, 24 HOUR
Collection Interval-UPROT: 24 hours
PROTEIN, URINE: 13 mg/dL (ref 5–24)
Protein, 24H Urine: 338 mg/d — ABNORMAL HIGH (ref <150)
Urine Total Volume-UPROT: 2600 mL

## 2014-04-24 LAB — CBC
HEMATOCRIT: 26.2 % — AB (ref 36.0–46.0)
Hemoglobin: 8.5 g/dL — ABNORMAL LOW (ref 12.0–15.0)
MCH: 26.2 pg (ref 26.0–34.0)
MCHC: 32.4 g/dL (ref 30.0–36.0)
MCV: 80.9 fL (ref 78.0–100.0)
Platelets: 143 10*3/uL — ABNORMAL LOW (ref 150–400)
RBC: 3.24 MIL/uL — ABNORMAL LOW (ref 3.87–5.11)
RDW: 14.7 % (ref 11.5–15.5)
WBC: 8.8 10*3/uL (ref 4.0–10.5)

## 2014-04-24 LAB — APTT: APTT: 30 s (ref 24–37)

## 2014-04-24 LAB — PROTEIN / CREATININE RATIO, URINE
Creatinine, Urine: 74 mg/dL
PROTEIN CREATININE RATIO: 0.18 — AB (ref 0.00–0.15)
Total Protein, Urine: 13 mg/dL

## 2014-04-24 LAB — PROTIME-INR
INR: 1.18 (ref 0.00–1.49)
Prothrombin Time: 15.2 seconds (ref 11.6–15.2)

## 2014-04-24 LAB — FETAL FIBRONECTIN: FETAL FIBRONECTIN: NEGATIVE

## 2014-04-24 LAB — VITAMIN B12: VITAMIN B 12: 284 pg/mL (ref 211–911)

## 2014-04-24 LAB — FERRITIN: Ferritin: 6 ng/mL — ABNORMAL LOW (ref 10–291)

## 2014-04-24 MED ORDER — SODIUM CHLORIDE 0.9 % IV SOLN
510.0000 mg | Freq: Once | INTRAVENOUS | Status: AC
  Administered 2014-04-24: 510 mg via INTRAVENOUS
  Filled 2014-04-24: qty 17

## 2014-04-24 MED ORDER — LABETALOL HCL 100 MG PO TABS
100.0000 mg | ORAL_TABLET | Freq: Two times a day (BID) | ORAL | Status: DC
  Administered 2014-04-24 – 2014-04-25 (×2): 100 mg via ORAL
  Filled 2014-04-24 (×2): qty 1

## 2014-04-24 MED ORDER — CYCLOBENZAPRINE HCL 10 MG PO TABS
5.0000 mg | ORAL_TABLET | Freq: Three times a day (TID) | ORAL | Status: DC | PRN
  Administered 2014-04-24: 5 mg via ORAL
  Filled 2014-04-24: qty 1

## 2014-04-24 MED ORDER — SODIUM CHLORIDE 0.9 % IV SOLN
Freq: Once | INTRAVENOUS | Status: AC
  Administered 2014-04-24: 08:00:00 via INTRAVENOUS

## 2014-04-24 NOTE — Progress Notes (Signed)
UR completed 

## 2014-04-24 NOTE — Progress Notes (Signed)
Philipp DeputyKim Shaw CNM evaluated strip remotely, advised that 3 Procardia were given. CNM states ok to remove toco and to let her know if patient is awakened from sleep with abd pain/contractions.

## 2014-04-24 NOTE — Progress Notes (Signed)
Pt complains of back pain that is "burning, and throbbing" and started "when I layed down just now." Pt rates pain 7/10 and looks uncomfortable. NST complete and no contractions noted on monitor. Dr. Loreta AveAcosta notified of patient's pain and NST with FHR baseline 110, reactive, positive accelerations and no decelerations.

## 2014-04-24 NOTE — Progress Notes (Addendum)
Patient ID: Rebekah Smith, female   DOB: [birth year], 29 y.o.   MRN: [id number]  FACULTY PRACTICE ANTEPARTUM(COMPREHENSIVE) NOTE  Rebekah Smith is a 29 y.o. [id number] at 1041w0d  who is admitted for gestational hypertension and r/o preeclampsia.   Length of Stay:  2  Days  Subjective: Headache is better today after hydration, phenergan, and bendryl Patient reports the fetal movement as active. Patient reports uterine contraction  activity as irregular--did not feel contractions but monitor showed some. Patient reports  vaginal bleeding as none. Patient describes fluid per vagina as None.  Vitals:  Blood pressure 126/81, pulse 92, temperature 98.7 F (37.1 C), temperature source Oral, resp. rate 18, height 5\' 7"  (1.702 m), weight 188 lb (85.276 kg). Physical Examination:  General appearance - alert, well appearing, and in no distress Abdomen - soft, gravid, NT, no RUQ pain Extremities - Homan's sign negative bilaterally Cervix - internal os ft/long/no presenting part/posterior, tone  Fetal Monitoring:  Baseline: 120 bpm, Variability: Good {> 6 bpm), Accelerations: Reactive and Decelerations: Absent  Labs:  CBC    Component Value Date/Time   WBC 6.9 04/23/2014 0605   RBC 3.41* 04/23/2014 0605   HGB 8.9* 04/23/2014 0605   HCT 27.7* 04/23/2014 0605   PLT 143* 04/23/2014 0605   MCV 81.2 04/23/2014 0605   MCH 26.1 04/23/2014 0605   MCHC 32.1 04/23/2014 0605   RDW 14.7 04/23/2014 0605   LYMPHSABS 1.3 11/09/2013 1036   MONOABS 0.7 11/09/2013 1036   EOSABS 0.1 11/09/2013 1036   BASOSABS 0.0 11/09/2013 1036    CMP     Component Value Date/Time   NA 139 04/23/2014 0605   K 3.1* 04/23/2014 0605   CL 108 04/23/2014 0605   CO2 23 04/23/2014 0605   GLUCOSE 81 04/23/2014 0605   BUN 7 04/23/2014 0605   CREATININE 0.55 04/23/2014 0605   CREATININE 0.48* 04/21/2014 1044   CREATININE 0.48* 04/21/2014 1044   CALCIUM 8.6 04/23/2014 0605   PROT 5.2* 04/23/2014 0605   ALBUMIN  2.7* 04/23/2014 0605   AST 16 04/23/2014 0605   ALT 9 04/23/2014 0605   ALKPHOS 65 04/23/2014 0605   BILITOT 0.5 04/23/2014 0605   GFRNONAA >90 04/23/2014 0605   GFRAA >90 04/23/2014 0605    Medications:  Scheduled . betamethasone acetate-betamethasone sodium phosphate  12 mg Intramuscular q morning - 10a  . docusate sodium  100 mg Oral Daily  . prenatal multivitamin  1 tablet Oral Q1200   I have reviewed the patient's current medications.  ASSESSMENT: Patient Active Problem List   Diagnosis Date Noted  . Hypertension affecting pregnancy in third trimester, antepartum 04/22/2014  . [redacted] weeks gestation of pregnancy   . Gestational hypertension without significant proteinuria in third trimester   . Chronic hypertension in pregnancy 04/12/2014  . Low lying placenta with hemorrhage in second trimester, antepartum   . History of postpartum hemorrhage, currently pregnant in first trimester 11/16/2013  . History of gestational hypertension 11/16/2013  . Supervision of normal pregnancy in first trimester 11/09/2013  . Pre-syncope 08/25/2013  . Palpitations 10/05/2011  . Menorrhagia 10/05/2011    PLAN: #Lab called to locate 24 hour urine sent yesterday #Anemia work up #MCV low, will give Iron x1 IV #Cervix unlabored--will send ffn but no tocolysis at this time #anxiety--reviewed notes from family practice and ED--patient long standing history of dizziness and visual distubances with anxiety thought to be the etiology.  Also has history of elevated pressures--mild chonic hypertension likely with worsening  during this and prior pregnancies.    Rebekah Smith. 04/24/2014,6:34 AM

## 2014-04-25 DIAGNOSIS — O163 Unspecified maternal hypertension, third trimester: Secondary | ICD-10-CM

## 2014-04-25 DIAGNOSIS — O10913 Unspecified pre-existing hypertension complicating pregnancy, third trimester: Secondary | ICD-10-CM

## 2014-04-25 LAB — CBC
HCT: 23.8 % — ABNORMAL LOW (ref 36.0–46.0)
HEMOGLOBIN: 7.6 g/dL — AB (ref 12.0–15.0)
MCH: 26.2 pg (ref 26.0–34.0)
MCHC: 31.9 g/dL (ref 30.0–36.0)
MCV: 82.1 fL (ref 78.0–100.0)
PLATELETS: 121 10*3/uL — AB (ref 150–400)
RBC: 2.9 MIL/uL — ABNORMAL LOW (ref 3.87–5.11)
RDW: 14.8 % (ref 11.5–15.5)
WBC: 7.7 10*3/uL (ref 4.0–10.5)

## 2014-04-25 LAB — COMPREHENSIVE METABOLIC PANEL
ALT: 8 U/L (ref 0–35)
ANION GAP: 6 (ref 5–15)
AST: 20 U/L (ref 0–37)
Albumin: 2.5 g/dL — ABNORMAL LOW (ref 3.5–5.2)
Alkaline Phosphatase: 55 U/L (ref 39–117)
BUN: <5 mg/dL — ABNORMAL LOW (ref 6–23)
CHLORIDE: 112 mmol/L (ref 96–112)
CO2: 22 mmol/L (ref 19–32)
Calcium: 7.8 mg/dL — ABNORMAL LOW (ref 8.4–10.5)
Creatinine, Ser: 0.46 mg/dL — ABNORMAL LOW (ref 0.50–1.10)
GFR calc Af Amer: >90 mL/min (ref >90)
Glucose, Bld: 98 mg/dL (ref 70–99)
Potassium: 3.1 mmol/L — ABNORMAL LOW (ref 3.5–5.1)
Sodium: 140 mmol/L (ref 135–145)
Total Bilirubin: 0.3 mg/dL (ref 0.3–1.2)
Total Protein: 4.6 g/dL — ABNORMAL LOW (ref 6.0–8.3)

## 2014-04-25 MED ORDER — LABETALOL HCL 100 MG PO TABS: 100.0000 mg | ORAL_TABLET | Freq: Two times a day (BID) | ORAL | Status: DC

## 2014-04-25 NOTE — Discharge Instructions (Signed)

## 2014-04-25 NOTE — Discharge Summary (Signed)
Physician Discharge Summary  Patient ID: Rebekah Smith MRN: [id number] DOB/AGE: Oct 14, 1985 28 y.o.  Admit date: 04/22/2014 Discharge date: 04/25/2014  Admission Diagnoses: Chronic hypertension and anxiety  Discharge Diagnoses: Chronic hypertension with superimposed mild preeclampsia Active Problems:   Chronic hypertension in pregnancy   Hypertension affecting pregnancy in third trimester, antepartum   Discharged Condition: good  Hospital Course: patient admitted for observation and 24 hour urine collection secondary to elevated blood pressure in the third trimester and headache. Her headache resolved and she remained asymptomatic. Her BP remained in the 140's/90's. Her 24 hour urine collection was 338 mg protein. She was started on a low dose labetalol 100 mg BID. Discharge instructions were discussed. Patient will follow up at [facility] office to start twice weekly testing and outpatient management of mild preeclampsia.  Consults: None  Treatments: observation  Discharge Exam: Blood pressure 139/80, pulse 100, temperature 97.5 F (36.4 C), temperature source Oral, resp. rate 18, height 5\' 7"  (1.702 m), weight 188 lb (85.276 kg), SpO2 97 %. General appearance: alert, cooperative and no distress Resp: clear to auscultation bilaterally Cardio: regular rate and rhythm GI: soft, gravid, nontender Extremities: Homans sign is negative, no sign of DVT  Disposition: 01-Home or Self Care     Medication List    TAKE these medications        acetaminophen 325 MG tablet  Commonly known as:  TYLENOL  Take 2 tablets (650 mg total) by mouth every 6 (six) hours as needed.     labetalol 100 MG tablet  Commonly known as:  NORMODYNE  Take 1 tablet (100 mg total) by mouth 2 (two) times daily.     PRENATAL VITAMINS PO  Take by mouth.           Follow-up Information    [facility] [place].   Specialty:  Obstetrics and Gynecology   Why:  Call on Monday for follow up appointment   Contact information:   [address], [address] [place] [id number] [phone]      Signed: Delray Reza 04/25/2014, 8:23 AM

## 2014-04-25 NOTE — Progress Notes (Signed)
Patient discharged home in ambulatory condition with family at side. Patient denies any headache or blurred vision at this time. Will follow up as scheduled.

## 2014-04-26 ENCOUNTER — Encounter: Payer: Self-pay | Admitting: *Deleted

## 2014-04-26 ENCOUNTER — Ambulatory Visit (INDEPENDENT_AMBULATORY_CARE_PROVIDER_SITE_OTHER): Payer: Medicaid Other | Admitting: Obstetrics & Gynecology

## 2014-04-26 VITALS — BP 152/93 | HR 100 | Wt 193.0 lb

## 2014-04-26 DIAGNOSIS — Z3481 Encounter for supervision of other normal pregnancy, first trimester: Secondary | ICD-10-CM

## 2014-04-26 DIAGNOSIS — Z3491 Encounter for supervision of normal pregnancy, unspecified, first trimester: Secondary | ICD-10-CM

## 2014-04-26 DIAGNOSIS — O133 Gestational [pregnancy-induced] hypertension without significant proteinuria, third trimester: Secondary | ICD-10-CM

## 2014-04-26 LAB — FOLATE RBC
Folate, RBC: >2431 ng/mL (ref >498)
Hematocrit: 25.5 % — ABNORMAL LOW (ref 34.0–46.6)

## 2014-04-26 NOTE — Progress Notes (Signed)
Routine visit. She will need a note to stop working now/prn. She will increase her BP meds to 200mg  BID. DTRs 1+ bilaterally and equal. Pre eclampsia precautions reviewed. IOL at 37 weeks/prn sooner.

## 2014-04-27 LAB — TYPE AND SCREEN
ABO/RH(D): O POS
Antibody Screen: POSITIVE
DAT, IgG: NEGATIVE
Donor AG Type: NEGATIVE
Donor AG Type: NEGATIVE
PT AG Type: NEGATIVE
UNIT DIVISION: 0
Unit division: 0

## 2014-04-27 LAB — CREATININE, URINE, 24 HOUR
CREATININE, URINE: 63.4 mg/dL
Collection Interval-UCRE24: 24 hours
Creatinine, 24H Ur: 1648 mg/d (ref 700–1800)
Urine Total Volume-UCRE24: 2600 mL

## 2014-04-29 ENCOUNTER — Ambulatory Visit (INDEPENDENT_AMBULATORY_CARE_PROVIDER_SITE_OTHER): Payer: Medicaid Other | Admitting: *Deleted

## 2014-04-29 ENCOUNTER — Ambulatory Visit (INDEPENDENT_AMBULATORY_CARE_PROVIDER_SITE_OTHER): Payer: Medicaid Other | Admitting: Obstetrics & Gynecology

## 2014-04-29 ENCOUNTER — Inpatient Hospital Stay (HOSPITAL_COMMUNITY)
Admission: AD | Admit: 2014-04-29 | Discharge: 2014-04-29 | Disposition: A | Discharge status: Home / Self Care | Payer: Medicaid Other | Source: Ambulatory Visit | Attending: Obstetrics and Gynecology | Admitting: Obstetrics and Gynecology

## 2014-04-29 ENCOUNTER — Inpatient Hospital Stay (HOSPITAL_COMMUNITY)
Admission: AD | Admit: 2014-04-29 | Discharge: 2014-05-02 | DRG: 774 | Disposition: A | Discharge status: Home / Self Care | Payer: Medicaid Other | Source: Ambulatory Visit | Attending: Obstetrics & Gynecology | Admitting: Obstetrics & Gynecology

## 2014-04-29 ENCOUNTER — Encounter (HOSPITAL_COMMUNITY): Payer: Self-pay | Admitting: *Deleted

## 2014-04-29 VITALS — BP 136/83 | HR 96 | Wt 188.0 lb

## 2014-04-29 DIAGNOSIS — O1493 Unspecified pre-eclampsia, third trimester: Secondary | ICD-10-CM

## 2014-04-29 DIAGNOSIS — O133 Gestational [pregnancy-induced] hypertension without significant proteinuria, third trimester: Secondary | ICD-10-CM

## 2014-04-29 DIAGNOSIS — Z823 Family history of stroke: Secondary | ICD-10-CM

## 2014-04-29 DIAGNOSIS — O113 Pre-existing hypertension with pre-eclampsia, third trimester: Secondary | ICD-10-CM | POA: Diagnosis present

## 2014-04-29 DIAGNOSIS — O09893 Supervision of other high risk pregnancies, third trimester: Secondary | ICD-10-CM

## 2014-04-29 DIAGNOSIS — Z3A33 33 weeks gestation of pregnancy: Secondary | ICD-10-CM | POA: Insufficient documentation

## 2014-04-29 DIAGNOSIS — Z3483 Encounter for supervision of other normal pregnancy, third trimester: Secondary | ICD-10-CM | POA: Diagnosis present

## 2014-04-29 DIAGNOSIS — Z3491 Encounter for supervision of normal pregnancy, unspecified, first trimester: Secondary | ICD-10-CM

## 2014-04-29 DIAGNOSIS — O119 Pre-existing hypertension with pre-eclampsia, unspecified trimester: Secondary | ICD-10-CM | POA: Diagnosis present

## 2014-04-29 DIAGNOSIS — O1490 Unspecified pre-eclampsia, unspecified trimester: Secondary | ICD-10-CM | POA: Insufficient documentation

## 2014-04-29 LAB — COMPREHENSIVE METABOLIC PANEL
ALT: 13 U/L (ref 0–35)
AST: 18 U/L (ref 0–37)
Albumin: 3 g/dL — ABNORMAL LOW (ref 3.5–5.2)
Alkaline Phosphatase: 81 U/L (ref 39–117)
Anion gap: 8 (ref 5–15)
BUN: 6 mg/dL (ref 6–23)
CHLORIDE: 104 mmol/L (ref 96–112)
CO2: 26 mmol/L (ref 19–32)
Calcium: 8.6 mg/dL (ref 8.4–10.5)
Creatinine, Ser: 0.52 mg/dL (ref 0.50–1.10)
Glucose, Bld: 90 mg/dL (ref 70–99)
POTASSIUM: 3.4 mmol/L — AB (ref 3.5–5.1)
Sodium: 138 mmol/L (ref 135–145)
Total Bilirubin: 0.6 mg/dL (ref 0.3–1.2)
Total Protein: 6 g/dL (ref 6.0–8.3)

## 2014-04-29 LAB — URINALYSIS, ROUTINE W REFLEX MICROSCOPIC
BILIRUBIN URINE: NEGATIVE
GLUCOSE, UA: NEGATIVE mg/dL
Ketones, ur: NEGATIVE mg/dL
Leukocytes, UA: NEGATIVE
NITRITE: NEGATIVE
Protein, ur: NEGATIVE mg/dL
Specific Gravity, Urine: 1.025 (ref 1.005–1.030)
Urobilinogen, UA: 0.2 mg/dL (ref 0.0–1.0)
pH: 7 (ref 5.0–8.0)

## 2014-04-29 LAB — CBC
HCT: 29.2 % — ABNORMAL LOW (ref 36.0–46.0)
HCT: 32 % — ABNORMAL LOW (ref 36.0–46.0)
Hemoglobin: 10.2 g/dL — ABNORMAL LOW (ref 12.0–15.0)
Hemoglobin: 9.3 g/dL — ABNORMAL LOW (ref 12.0–15.0)
MCH: 26.5 pg (ref 26.0–34.0)
MCH: 26.5 pg (ref 26.0–34.0)
MCHC: 31.8 g/dL (ref 30.0–36.0)
MCHC: 31.9 g/dL (ref 30.0–36.0)
MCV: 83.1 fL (ref 78.0–100.0)
MCV: 83.2 fL (ref 78.0–100.0)
Platelets: 144 10*3/uL — ABNORMAL LOW (ref 150–400)
Platelets: 163 10*3/uL (ref 150–400)
RBC: 3.51 MIL/uL — ABNORMAL LOW (ref 3.87–5.11)
RBC: 3.85 MIL/uL — ABNORMAL LOW (ref 3.87–5.11)
RDW: 16.1 % — ABNORMAL HIGH (ref 11.5–15.5)
RDW: 17.1 % — ABNORMAL HIGH (ref 11.5–15.5)
WBC: 8.2 10*3/uL (ref 4.0–10.5)
WBC: 9.3 10*3/uL (ref 4.0–10.5)

## 2014-04-29 LAB — US OB FOLLOW UP

## 2014-04-29 LAB — URINE MICROSCOPIC-ADD ON

## 2014-04-29 LAB — PROTEIN / CREATININE RATIO, URINE
Creatinine, Urine: 183 mg/dL
Protein Creatinine Ratio: 0.19 — ABNORMAL HIGH (ref 0.00–0.15)
Total Protein, Urine: 35 mg/dL

## 2014-04-29 LAB — OB RESULTS CONSOLE GBS: GBS: NEGATIVE

## 2014-04-29 LAB — GROUP B STREP BY PCR: GROUP B STREP BY PCR: NEGATIVE

## 2014-04-29 MED ORDER — MISOPROSTOL 25 MCG QUARTER TABLET
25.0000 ug | ORAL_TABLET | ORAL | Status: DC | PRN
  Administered 2014-04-29 – 2014-04-30 (×3): 25 ug via VAGINAL
  Filled 2014-04-29 (×2): qty 0.25
  Filled 2014-04-29: qty 1
  Filled 2014-04-29 (×3): qty 0.25

## 2014-04-29 MED ORDER — OXYTOCIN BOLUS FROM INFUSION
500.0000 mL | INTRAVENOUS | Status: DC
  Administered 2014-04-30: 500 mL via INTRAVENOUS

## 2014-04-29 MED ORDER — LACTATED RINGERS IV SOLN: 500.0000 mL | INTRAVENOUS | Status: DC | PRN

## 2014-04-29 MED ORDER — CITRIC ACID-SODIUM CITRATE 334-500 MG/5ML PO SOLN
30.0000 mL | ORAL | Status: DC | PRN
  Filled 2014-04-29: qty 30

## 2014-04-29 MED ORDER — MAGNESIUM SULFATE 40 G IN LACTATED RINGERS - SIMPLE
2.0000 g/h | INTRAVENOUS | Status: DC
  Administered 2014-04-29 – 2014-04-30 (×2): 2 g/h via INTRAVENOUS
  Filled 2014-04-29 (×2): qty 500

## 2014-04-29 MED ORDER — LABETALOL HCL 5 MG/ML IV SOLN: 20.0000 mg | Freq: Once | INTRAVENOUS | Status: AC | PRN

## 2014-04-29 MED ORDER — ZOLPIDEM TARTRATE 5 MG PO TABS
5.0000 mg | ORAL_TABLET | Freq: Every evening | ORAL | Status: DC | PRN
  Administered 2014-04-29: 5 mg via ORAL
  Filled 2014-04-29: qty 1

## 2014-04-29 MED ORDER — OXYCODONE-ACETAMINOPHEN 5-325 MG PO TABS: 1.0000 | ORAL_TABLET | ORAL | Status: DC | PRN

## 2014-04-29 MED ORDER — BUTALBITAL-APAP-CAFFEINE 50-325-40 MG PO TABS
2.0000 | ORAL_TABLET | Freq: Four times a day (QID) | ORAL | Status: DC | PRN
  Administered 2014-04-29: 2 via ORAL
  Filled 2014-04-29 (×2): qty 1

## 2014-04-29 MED ORDER — HYDROCODONE-ACETAMINOPHEN 5-325 MG PO TABS
1.0000 | ORAL_TABLET | Freq: Four times a day (QID) | ORAL | Status: DC | PRN
Start: 2014-04-29 — End: 2014-06-11

## 2014-04-29 MED ORDER — LABETALOL HCL 100 MG PO TABS: 400.0000 mg | ORAL_TABLET | Freq: Two times a day (BID) | ORAL | Status: DC

## 2014-04-29 MED ORDER — OXYCODONE-ACETAMINOPHEN 5-325 MG PO TABS: 2.0000 | ORAL_TABLET | ORAL | Status: DC | PRN

## 2014-04-29 MED ORDER — ONDANSETRON HCL 4 MG/2ML IJ SOLN: 4.0000 mg | Freq: Four times a day (QID) | INTRAMUSCULAR | Status: DC | PRN

## 2014-04-29 MED ORDER — LIDOCAINE HCL (PF) 1 % IJ SOLN
30.0000 mL | INTRAMUSCULAR | Status: DC | PRN
  Filled 2014-04-29: qty 30

## 2014-04-29 MED ORDER — ACETAMINOPHEN 325 MG PO TABS: 650.0000 mg | ORAL_TABLET | ORAL | Status: DC | PRN

## 2014-04-29 MED ORDER — OXYTOCIN 40 UNITS IN LACTATED RINGERS INFUSION - SIMPLE MED
62.5000 mL/h | INTRAVENOUS | Status: DC
  Filled 2014-04-29: qty 1000

## 2014-04-29 MED ORDER — OXYCODONE-ACETAMINOPHEN 5-325 MG PO TABS
2.0000 | ORAL_TABLET | Freq: Once | ORAL | Status: AC
  Administered 2014-04-29: 2 via ORAL
  Filled 2014-04-29: qty 2

## 2014-04-29 MED ORDER — MAGNESIUM SULFATE BOLUS VIA INFUSION
4.0000 g | Freq: Once | INTRAVENOUS | Status: AC
  Administered 2014-04-29: 4 g via INTRAVENOUS
  Filled 2014-04-29: qty 500

## 2014-04-29 MED ORDER — LACTATED RINGERS IV SOLN
INTRAVENOUS | Status: DC
  Administered 2014-04-29 – 2014-04-30 (×2): via INTRAVENOUS

## 2014-04-29 MED ORDER — TERBUTALINE SULFATE 1 MG/ML IJ SOLN: 0.2500 mg | Freq: Once | INTRAMUSCULAR | Status: AC | PRN

## 2014-04-29 NOTE — Progress Notes (Signed)
Pt continues to have headache through percocet.  Pt also had elevated BPs last night and needed her labetalol increased.  Sent to L&D for evaluation, possible MFM consult, possible induction.l

## 2014-04-29 NOTE — H&P (Signed)
Rebekah Smith is a 29 y.o. female 33+5 presenting for induction of labor for chrronic hypertension with superimposed preeclampsia ( severe range) Maternal Medical History:  Reason for admission: Nausea. Chronic hypertension with superimposed preeclampsia; Severe blood pressure range  Contractions: none  Fetal activity: Perceived fetal activity is normal.   Last perceived fetal movement was within the past hour.   Fhr- 130's reassurring Cat 1  Prenatal complications: PIH and pre-eclampsia.   Prenatal Complications - Diabetes: none.    OB History    Gravida Para Term Preterm AB TAB SAB Ectopic Multiple Living   Past Medical History  Diagnosis Date  . Blood transfusion   . Hemorrhage   . Abnormal Pap smear of cervix     colposcopy  . Anemia   . Hypertension   . Gestational hypertension without significant proteinuria in third trimester    Past Surgical History  Procedure Laterality Date  . Tonsillectomy    . Tonsillectomy     Family History: family history includes Anxiety disorder in her mother; Breast cancer in her maternal aunt and paternal grandmother; Cervical cancer in her maternal aunt; Hypertension in her mother; Stroke in her maternal grandmother. Social History:  reports that she has never smoked. She has never used smokeless tobacco. She reports that she does not drink alcohol or use illicit drugs. Review of Systems  Eyes: Negative for blurred vision, double vision and photophobia.  Gastrointestinal: Negative for nausea, vomiting and abdominal pain.  Neurological: Positive for headaches. Negative for seizures and loss of consciousness.    Dilation: Closed Effacement (%): Thick Exam by:: Sabas Frett, cnm Blood pressure 136/99, pulse 94, resp. rate 18, height  (1.702 m), weight 85.276 kg (188 lb). Maternal Exam:  Uterine Assessment: none  Abdomen: Patient reports no abdominal tenderness. Fundal height is 33.    Introitus:  Normal vulva. Normal vagina.  Ferning test: not done.  Nitrazine test: not done. Amniotic fluid character: not assessed.  Pelvis: adequate for delivery.   Cervix: Cervix evaluated by digital exam.     Fetal Exam Fetal Monitor Review: Mode: ultrasound.   Baseline rate: 130.  Pattern: accelerations present and no decelerations.    Fetal State Assessment: Category I - tracings are normal.     Physical Exam  Constitutional: She is oriented to person, place, and time. She appears well-developed and well-nourished.  HENT:  Head: Normocephalic and atraumatic.  Neck: Normal range of motion.  Cardiovascular: Normal rate and regular rhythm.   Respiratory: Effort normal and breath sounds normal.  GI: Soft. Bowel sounds are normal. There is no tenderness.  Genitourinary: Vagina normal and uterus normal.  Musculoskeletal: She exhibits edema.  Trace: feet hands  Neurological: She is alert and oriented to person, place, and time. She displays normal reflexes. She exhibits normal muscle tone.  Skin: Skin is warm and dry.  Psychiatric: She has a normal mood and affect. Her behavior is normal. Judgment and thought content normal.    Prenatal labs: ABO, Rh: --/--/O POS (01/22 1610) Antibody: POS (01/22 9604) Rubella: 0.81 (08/10 1036) RPR: NON REAC (12/28 1339)  HBsAg: NEGATIVE (08/10 1036)  HIV: NONREACTIVE (12/28 1339)  GBS:     Assessment/Plan: A: IUP at 33+[redacted] weeks gestation     Chronic hypertension with superimposed preeclampsia P: Medical Induction Cytotec      Magnesium Sulfate      NICU consult  GBS PCR -Collect  Rebekah Smith 04/29/2014, 5:24 PM

## 2014-04-29 NOTE — MAU Note (Signed)
Dr Wende Mottmckeag called and informed of pt's presence presence on the unit

## 2014-04-29 NOTE — MAU Provider Note (Signed)
Chief Complaint:  Hypertension   First Provider Initiated Contact with Patient 04/29/14 0114     HPI: Rebekah Smith is a 30 y.o. [id number] at [redacted]w[redacted]d who presents to maternity admissions reporting high BP. Patient states that she first recognized her BP was elevated when she started experiencing some photophobia and some blurriness in her vision when attempting to read. At that time she took her BP and found it to be "in the 150s over 100s". This was at 7pm 1/27. She states that she was not due for her second dose of labetalol until 10pm -- she took that dose early, at 8pm. She denied any other symptoms at this time. She reports having a good appetite, and good fluid intake throughout the day. No contractions, vaginal bleeding, or fluid leakage. No fever, chills, n/v/d, abdominal pain, or muscle aches. Endorses good fetal movement.   Patient reported a vague HA later in her MAU visit.  Pregnancy Course:  Clinic  [facility]  Dating  9 week [place]  Genetic Screen      Quad: Ordered.  Pt did not go to lab in [place] to have drawn                  Anatomic [place]  20 wks incomplete. Now complete  Cord insertion is central, not marginal. Placenta is No longer low lying   GTT Early:            Third trimester: 28  TDaP vaccine 2015 at 28 weeks  Flu vaccine 01/25/14  GBS   Contraception   Baby Food  Breast  Circumcision  Outpatient Circ  Pediatrician  [facility] Pediatric > Teaching Service  Support Person  FOB-David, son in room [place] (3yo)  [facility]   Past Medical History: Past Medical History  Diagnosis Date  . Blood transfusion   . Hemorrhage   . Abnormal Pap smear of cervix     colposcopy  . Anemia   . Hypertension   . Gestational hypertension without significant proteinuria in third trimester     Past obstetric history: OB History  Gravida Para Term Preterm AB SAB TAB Ectopic Multiple Living  # Outcome Date GA Lbr Len/2nd Weight Sex Delivery Anes PTL Lv  4 Current            3 SAB 06/09/13          2 Term 09/01/10 [redacted]w[redacted]d  4.338 kg (9 lb 9 oz) M Vag-Spont EPI N Y     Comments: PPH  1 Term 12/03/07 [redacted]w[redacted]d  3.884 kg (8 lb 9 oz) M Vag-Spont EPI N Y      Past Surgical History: Past Surgical History  Procedure Laterality Date  . Tonsillectomy    . Tonsillectomy       Family History: Family History  Problem Relation Age of Onset  . Breast cancer Maternal Aunt   . Breast cancer Paternal Grandmother   . Cervical cancer Maternal Aunt   . Anxiety disorder Mother   . Hypertension Mother   . Stroke Maternal Grandmother     Social History: History  Substance Use Topics  . Smoking status: Never Smoker   . Smokeless tobacco: Never Used  . Alcohol Use: No    Allergies: No Known Allergies  Meds:  Prescriptions prior to admission  Medication Sig Dispense Refill Last Dose  . acetaminophen (TYLENOL) 325 MG tablet Take 2 tablets (  650 mg total) by mouth every 6 (six) hours as needed. 30 tablet 1 Taking  . Prenatal Multivit-Min-Fe-FA (PRENATAL VITAMINS PO) Take by mouth.   Taking  . [DISCONTINUED] labetalol (NORMODYNE) 100 MG tablet Take 1 tablet (100 mg total) by mouth 2 (two) times daily. 60 tablet 1 Taking    ROS: Pertinent findings in history of present illness.  Physical Exam  Blood pressure 138/90, pulse 84. GENERAL: Well-developed, well-nourished female. In some discomfort HEENT: normocephalic, EOMI, PERRLA HEART: RRR, no murmur RESP: normal effort ABDOMEN: Soft, non-tender, gravid appropriate for gestational age EXTREMITIES: Nontender, no edema, dorsalis pedis and posterior tibialis pulses present and symmetrical. Patellar tendon reflexes +3.  NEURO: alert and oriented. Speaking clearly. CNII-XII intact  FHT:  Baseline 120, moderate variability, accelerations present, no decelerations Contractions: minimal/absent   Labs: Results for orders placed or performed during the hospital encounter of 04/29/14 (from the past 24 hour(s))   Urinalysis, Routine w reflex microscopic     Status: Abnormal   Collection Time: 04/29/14 12:15 AM  Result Value Ref Range   Color, Urine YELLOW YELLOW   APPearance CLEAR CLEAR   Specific Gravity, Urine 1.025 1.005 - 1.030   pH 7.0 5.0 - 8.0   Glucose, UA NEGATIVE NEGATIVE mg/dL   Hgb urine dipstick TRACE (A) NEGATIVE   Bilirubin Urine NEGATIVE NEGATIVE   Ketones, ur NEGATIVE NEGATIVE mg/dL   Protein, ur NEGATIVE NEGATIVE mg/dL   Urobilinogen, UA 0.2 0.0 - 1.0 mg/dL   Nitrite NEGATIVE NEGATIVE   Leukocytes, UA NEGATIVE NEGATIVE  Protein / creatinine ratio, urine     Status: Abnormal   Collection Time: 04/29/14 12:15 AM  Result Value Ref Range   Creatinine, Urine 183.00 mg/dL   Total Protein, Urine 35 mg/dL   Protein Creatinine Ratio 0.19 (H) 0.00 - 0.15  Urine microscopic-add on     Status: Abnormal   Collection Time: 04/29/14 12:15 AM  Result Value Ref Range   Squamous Epithelial / LPF FEW (A) RARE   WBC, UA 0-2 <3 WBC/hpf   RBC / HPF 0-2 <3 RBC/hpf   Bacteria, UA FEW (A) RARE   Urine-Other MUCOUS PRESENT   CBC     Status: Abnormal   Collection Time: 04/29/14 12:50 AM  Result Value Ref Range   WBC 9.3 4.0 - 10.5 K/uL   RBC 3.51 (L) 3.87 - 5.11 MIL/uL   Hemoglobin 9.3 (L) 12.0 - 15.0 g/dL   HCT 16.1 (L) 09.6 - 04.5 %   MCV 83.2 78.0 - 100.0 fL   MCH 26.5 26.0 - 34.0 pg   MCHC 31.8 30.0 - 36.0 g/dL   RDW 40.9 (H) 81.1 - 91.4 %   Platelets 144 (L) 150 - 400 K/uL  Comprehensive metabolic panel     Status: Abnormal   Collection Time: 04/29/14 12:50 AM  Result Value Ref Range   Sodium 138 135 - 145 mmol/L   Potassium 3.4 (L) 3.5 - 5.1 mmol/L   Chloride 104 96 - 112 mmol/L   CO2 26 19 - 32 mmol/L   Glucose, Bld 90 70 - 99 mg/dL   BUN 6 6 - 23 mg/dL   Creatinine, Ser 7.82 0.50 - 1.10 mg/dL   Calcium 8.6 8.4 - 95.6 mg/dL   Total Protein 6.0 6.0 - 8.3 g/dL   Albumin 3.0 (L) 3.5 - 5.2 g/dL   AST 18 0 - 37 U/L   ALT 13 0 - 35 U/L   Alkaline Phosphatase 81 39 - 117 U/L  Total Bilirubin 0.6 0.3 - 1.2 mg/dL   GFR calc non Af Amer >90 >90 mL/min   GFR calc Af Amer >90 >90 mL/min   Anion gap 8 5 - 15    Imaging:  [place] Ob Follow Up  04/15/2014   OBSTETRICAL ULTRASOUND: This exam was performed within a [facility]. The OB US report was generated in the AS system, and faxed to the ordering physician.   This report is available in the [employer] [employer] PACS. See the AS Obstetric US report via the Image Link.  [place] Fetal Bpp W/o Non Stress  04/13/2014   OBSTETRICAL ULTRASOUND: This exam was performed within a [facility] Department. The OB US report was generated in the AS system, and faxed to the ordering physician.   This report is available in the [employer] [employer] PACS. See the AS Obstetric US report via the Image Link.  MAU Course: - Obtained CBC, CMP, and Protein/Creatinine ratio - Gave 2 tablets 5/325mg  Percocet for HA - Observed - Patient deemed medically stable and appropriate for DC with strict f/u w/ OBGYN by 04/30/14.  Assessment: Gestational vs. Chronic HTN in preterm pregnancy -- patient continues to have poor control. Short-term complaints of severe features (vision changes). Labs currently stable. Has received betamethasone.  Plan: - Discharge home with strict f/u w/ OBGYN by this Friday, 04/30/14.  - Increase Labetalol to 400mg  BID - Vicodin 5/325mg  for HA (x10 tabs) - Instruction on the necessity to be seen if BP >160/110 - Labor precautions and fetal kick counts  Follow-up Information    [facility] [place]. Schedule an appointment as soon as possible for a visit on 04/30/2014.   Specialty:  Obstetrics and Gynecology   Contact information:   [address], [address] [place] [id number] [phone]       Medication List    TAKE these medications        acetaminophen 325 MG tablet  Commonly known as:  TYLENOL  Take 2 tablets (650 mg total) by mouth every 6 (six)  hours as needed.     HYDROcodone-acetaminophen 5-325 MG per tablet  Commonly known as:  NORCO/VICODIN  Take 1 tablet by mouth every 6 (six) hours as needed for moderate pain.     labetalol 100 MG tablet  Commonly known as:  NORMODYNE  Take 4 tablets (400 mg total) by mouth 2 (two) times daily.     PRENATAL VITAMINS PO  Take by mouth.        Kathee DeltonIan D McKeag, MD 04/29/2014 3:08 AM

## 2014-04-29 NOTE — MAU Note (Signed)
Pt states her vision was blurry and she usually takes her blood pressure before taking her medications.Pt states she took her medication and the waited an hour

## 2014-04-29 NOTE — Discharge Instructions (Signed)
Hypertension During Pregnancy °Hypertension, or high blood pressure, is when there is extra pressure inside your blood vessels that carry blood from the heart to the rest of your body (arteries). It can happen at any time in life, including pregnancy. Hypertension during pregnancy can cause problems for you and your baby. Your baby might not weigh as much as he or she should at birth or might be born early (premature). Very bad cases of hypertension during pregnancy can be life-threatening.  °Different types of hypertension can occur during pregnancy. These include: °· Chronic hypertension. This happens when a woman has hypertension before pregnancy and it continues during pregnancy. °· Gestational hypertension. This is when hypertension develops during pregnancy. °· Preeclampsia or toxemia of pregnancy. This is a very serious type of hypertension that develops only during pregnancy. It affects the whole body and can be very dangerous for both mother and baby.   °Gestational hypertension and preeclampsia usually go away after your baby is born. Your blood pressure will likely stabilize within 6 weeks. Women who have hypertension during pregnancy have a greater chance of developing hypertension later in life or with future pregnancies. °RISK FACTORS °There are certain factors that make it more likely for you to develop hypertension during pregnancy. These include: °· Having hypertension before pregnancy. °· Having hypertension during a previous pregnancy. °· Being overweight. °· Being older than 40 years. °· Being pregnant with more than one baby. °· Having diabetes or kidney problems. °SIGNS AND SYMPTOMS °Chronic and gestational hypertension rarely cause symptoms. Preeclampsia has symptoms, which may include: °· Increased protein in your urine. Your health care provider will check for this at every prenatal visit. °· Swelling of your hands and face. °· Rapid weight gain. °· Headaches. °· Visual changes. °· Being  bothered by light. °· Abdominal pain, especially in the upper right area. °· Chest pain. °· Shortness of breath. °· Increased reflexes. °· Seizures. These occur with a more severe form of preeclampsia, called eclampsia. °DIAGNOSIS  °You may be diagnosed with hypertension during a regular prenatal exam. At each prenatal visit, you may have: °· Your blood pressure checked. °· A urine test to check for protein in your urine. °The type of hypertension you are diagnosed with depends on when you developed it. It also depends on your specific blood pressure reading. °· Developing hypertension before 20 weeks of pregnancy is consistent with chronic hypertension. °· Developing hypertension after 20 weeks of pregnancy is consistent with gestational hypertension. °· Hypertension with increased urinary protein is diagnosed as preeclampsia. °· Blood pressure measurements that stay above 160 systolic or 110 diastolic are a sign of severe preeclampsia. °TREATMENT °Treatment for hypertension during pregnancy varies. Treatment depends on the type of hypertension and how serious it is. °· If you take medicine for chronic hypertension, you may need to switch medicines. °¨ Medicines called ACE inhibitors should not be taken during pregnancy. °¨ Low-dose aspirin may be suggested for women who have risk factors for preeclampsia. °· If you have gestational hypertension, you may need to take a blood pressure medicine that is safe during pregnancy. Your health care provider will recommend the correct medicine. °· If you have severe preeclampsia, you may need to be in the hospital. Health care providers will watch you and your baby very closely. You also may need to take medicine called magnesium sulfate to prevent seizures and lower blood pressure. °· Sometimes, an early delivery is needed. This may be the case if the condition worsens. It would be   done to protect you and your baby. The only cure for preeclampsia is delivery. °· Your health  care provider may recommend that you take one low-dose aspirin (81 mg) each day to help prevent high blood pressure during your pregnancy if you are at risk for preeclampsia. You may be at risk for preeclampsia if: °¨ You had preeclampsia or eclampsia during a previous pregnancy. °¨ Your baby did not grow as expected during a previous pregnancy. °¨ You experienced preterm birth with a previous pregnancy. °¨ You experienced a separation of the placenta from the uterus (placental abruption) during a previous pregnancy. °¨ You experienced the loss of your baby during a previous pregnancy. °¨ You are pregnant with more than one baby. °¨ You have other medical conditions, such as diabetes or an autoimmune disease. °HOME CARE INSTRUCTIONS °· Schedule and keep all of your regular prenatal care appointments. This is important. °· Take medicines only as directed by your health care provider. Tell your health care provider about all medicines you take. °· Eat as little salt as possible. °· Get regular exercise. °· Do not drink alcohol. °· Do not use tobacco products. °· Do not drink products with caffeine. °· Lie on your left side when resting. °SEEK IMMEDIATE MEDICAL CARE IF: °· You have severe abdominal pain. °· You have sudden swelling in your hands, ankles, or face. °· You gain 4 pounds (1.8 kg) or more in 1 week. °· You vomit repeatedly. °· You have vaginal bleeding. °· You do not feel your baby moving as much. °· You have a headache. °· You have blurred or double vision. °· You have muscle twitching or spasms. °· You have shortness of breath. °· You have blue fingernails or lips. °· You have blood in your urine. °MAKE SURE YOU: °· Understand these instructions. °· Will watch your condition. °· Will get help right away if you are not doing well or get worse. °Document Released: 12/05/2010 Document Revised: 08/03/2013 Document Reviewed: 10/16/2012 °ExitCare® Patient Information ©2015 [employer]. This information is not  intended to replace advice given to you by your health care provider. Make sure you discuss any questions you have with your health care provider. ° °Preeclampsia and Eclampsia °Preeclampsia is a serious condition that develops only during pregnancy. It is also called toxemia of pregnancy. This condition causes high blood pressure along with other symptoms, such as swelling and headaches. These may develop as the condition gets worse. Preeclampsia may occur 20 weeks or later into your pregnancy.  °Diagnosing and treating preeclampsia early is very important. If not treated early, it can cause serious problems for you and your baby. One problem it can lead to is eclampsia, which is a condition that causes muscle jerking or shaking (convulsions) in the mother. Delivering your baby is the best treatment for preeclampsia or eclampsia.  °RISK FACTORS °The cause of preeclampsia is not known. You may be more likely to develop preeclampsia if you have certain risk factors. These include:  °· Being pregnant for the first time. °· Having preeclampsia in a past pregnancy. °· Having a family history of preeclampsia. °· Having high blood pressure. °· Being pregnant with twins or triplets. °· Being 35 or older. °· Being African American. °· Having kidney disease or diabetes. °· Having medical conditions such as lupus or blood diseases. °· Being very overweight (obese). °SIGNS AND SYMPTOMS  °The earliest signs of preeclampsia are: °· High blood pressure. °· Increased protein in your urine. Your health care   provider will check for this at every prenatal visit. °Other symptoms that can develop include:  °· Severe headaches. °· Sudden weight gain. °· Swelling of your hands, face, legs, and feet. °· Feeling sick to your stomach (nauseous) and throwing up (vomiting). °· Vision problems (blurred or double vision). °· Numbness in your face, arms, legs, and feet. °· Dizziness. °· Slurred speech. °· Sensitivity to bright  lights. °· Abdominal pain. °DIAGNOSIS  °There are no screening tests for preeclampsia. Your health care provider will ask you about symptoms and check for signs of preeclampsia during your prenatal visits. You may also have tests, including: °· Urine testing. °· Blood testing. °· Checking your baby's heart rate. °· Checking the health of your baby and your placenta using images created with sound waves (ultrasound). °TREATMENT  °You can work out the best treatment approach together with your health care provider. It is very important to keep all prenatal appointments. If you have an increased risk of preeclampsia, you may need more frequent prenatal exams. °· Your health care provider may prescribe bed rest. °· You may have to eat as little salt as possible. °· You may need to take medicine to lower your blood pressure if the condition does not respond to more conservative measures. °· You may need to stay in the hospital if your condition is severe. There, treatment will focus on controlling your blood pressure and fluid retention. You may also need to take medicine to prevent seizures. °· If the condition gets worse, your baby may need to be delivered early to protect you and the baby. You may have your labor started with medicine (be induced), or you may have a cesarean delivery. °· Preeclampsia usually goes away after the baby is born. °HOME CARE INSTRUCTIONS  °· Only take over-the-counter or prescription medicines as directed by your health care provider. °· Lie on your left side while resting. This keeps pressure off your baby. °· Elevate your feet while resting. °· Get regular exercise. Ask your health care provider what type of exercise is safe for you. °· Avoid caffeine and alcohol. °· Do not smoke. °· Drink 6-8 glasses of water every day. °· Eat a balanced diet that is low in salt. Do not add salt to your food. °· Avoid stressful situations as much as possible. °· Get plenty of rest and sleep. °· Keep all  prenatal appointments and tests as scheduled. °SEEK MEDICAL CARE IF: °· You are gaining more weight than expected. °· You have any headaches, abdominal pain, or nausea. °· You are bruising more than usual. °· You feel dizzy or light-headed. °SEEK IMMEDIATE MEDICAL CARE IF:  °· You develop sudden or severe swelling anywhere in your body. This usually happens in the legs. °· You gain 5 lb (2.3 kg) or more in a week. °· You have a severe headache, dizziness, problems with your vision, or confusion. °· You have severe abdominal pain. °· You have lasting nausea or vomiting. °· You have a seizure. °· You have trouble moving any part of your body. °· You develop numbness in your body. °· You have trouble speaking. °· You have any abnormal bleeding. °· You develop a stiff neck. °· You pass out. °MAKE SURE YOU:  °· Understand these instructions. °· Will watch your condition. °· Will get help right away if you are not doing well or get worse. °Document Released: 03/16/2000 Document Revised: 03/24/2013 Document Reviewed: 01/09/2013 °ExitCare® Patient Information ©2015 [employer]. This information is not   intended to replace advice given to you by your health care provider. Make sure you discuss any questions you have with your health care provider. ° °

## 2014-04-29 NOTE — Progress Notes (Signed)
   Rebekah Smith is a 29 y.o. [id number] at 3039w5d  admitted for induction of labor due to Pre-eclamptic toxemia of pregnancy..  Subjective: Mild HA, some blurry vision since MgSO4 started  Objective: Filed Vitals:   04/29/14 1847 04/29/14 1852 04/29/14 1857 04/29/14 1901  BP:    145/95  Pulse: 90 107 96 105  Resp:    18  Height:      Weight:          FHT:  FHR: 140 bpm, variability: moderate,  accelerations:  Present,  decelerations:  Absent UC:   none SVE:   Dilation: Closed Effacement (%): Thick Exam by:: Rebekah Smith, cnm   Labs: Lab Results  Component Value Date   WBC 8.2 04/29/2014   HGB 10.2* 04/29/2014   HCT 32.0* 04/29/2014   MCV 83.1 04/29/2014   PLT 163 04/29/2014    Assessment / Plan: IOL for severe preeclampsia, ripening phase Plan cytotec>foley/pitocin Continue MgSO4 Labor: no Fetal Wellbeing:  Category I Pain Control:  Labor support without medications Anticipated MOD:  NSVD  CRESENZO-DISHMAN,Czarina Gingras 04/29/2014, 8:41 PM

## 2014-04-29 NOTE — Consult Note (Signed)
Neonatology Consult to Antenatal Patient: 04/29/2014 8:24 PM    I was requested by Dr Macon LargeAnyanwu to see this patient in order to provide antenatal counseling due to chronic hypertension with superimposed preeclampsia at 33 5/[redacted] weeks gestation.    [name] is a  29 y/o G4P2 who was admitted today for induction secondary to worsening hypertension accompanied by blurred vision and severe headache, now at 33 5/[redacted] weeks GA.   She is currently "not" having active labor.  She is received a course of BMZ (1/21 ans 1/22) and presently on Magnesium Sulfate and Labetalol.   I spoke with Ms Rebekah Smith and her parents in Room 170.   We discussed in detail what to expect during the delivery of the infant in the next few days including morbidity and mortality at this gestational age, usual delivery room resuscitation.  Discussed possible respiratory complications secondary to hypermagnesemia, IV access, sepsis work-up, NG/OG feedings ( MOB desires breast feeding, which was encouraged) and length of stay.  They had a few questions, which I answered.     Thank you for asking us to see this patient and allowing us to participate in her care.  Please call if there are any further questions.   [place]ton MamMary Ann T Juwana Thoreson, MD (Attending Neonatologist)  Total length of face-to-face or floor/unit time for this encounter was 25 minutes. Counseling and/or coordination of care was greater than fifty percent of the time of the above.

## 2014-04-29 NOTE — Progress Notes (Signed)
   Rebekah Smith is a 29 y.o. [id number] at 335w5d  admitted for induction of labor due to Pre-eclamptic toxemia of pregnancy..  Subjective: Requests ambien  Objective: Filed Vitals:   04/29/14 2200 04/29/14 2208 04/29/14 2300 04/29/14 2309  BP:  156/97 154/112 142/104  Pulse:  90 91 91  Resp: 16     Height:      Weight:       Total I/O In: 490 [P.O.:240; I.V.:250] Out: 1000 [Urine:1000]  FHT:  FHR: 135 bpm, variability: moderate,  accelerations:  Present,  decelerations:  Absent UC:   none SVE:   Dilation: Closed Effacement (%): Thick Station: Ballotable Exam by:: L.Stubbs, RN  Labs: Lab Results  Component Value Date   WBC 8.2 04/29/2014   HGB 10.2* 04/29/2014   HCT 32.0* 04/29/2014   MCV 83.1 04/29/2014   PLT 163 04/29/2014    Assessment / Plan: IOL for severe preeclampsia, ripening phase occ severe range BP:  Will treat with IV labetolol prn >160/110 Labor: ripening phase Fetal Wellbeing:  Category I Pain Control:  Labor support without medications Anticipated MOD:  NSVD  Rebekah Smith 04/29/2014, 11:20 PM

## 2014-04-29 NOTE — Progress Notes (Signed)
[facility] C/o headache with 5/10 pain that started 2 hours ago.

## 2014-04-30 ENCOUNTER — Encounter (HOSPITAL_COMMUNITY): Payer: Self-pay

## 2014-04-30 ENCOUNTER — Inpatient Hospital Stay (HOSPITAL_COMMUNITY): Payer: Medicaid Other | Admitting: Anesthesiology

## 2014-04-30 DIAGNOSIS — O1413 Severe pre-eclampsia, third trimester: Secondary | ICD-10-CM

## 2014-04-30 DIAGNOSIS — Z3A33 33 weeks gestation of pregnancy: Secondary | ICD-10-CM

## 2014-04-30 LAB — COMPREHENSIVE METABOLIC PANEL
ALBUMIN: 3.1 g/dL — AB (ref 3.5–5.2)
ALK PHOS: 85 U/L (ref 39–117)
ALT: 13 U/L (ref 0–35)
ANION GAP: 6 (ref 5–15)
AST: 19 U/L (ref 0–37)
BUN: <5 mg/dL — ABNORMAL LOW (ref 6–23)
CO2: 26 mmol/L (ref 19–32)
CREATININE: 0.47 mg/dL — AB (ref 0.50–1.10)
Calcium: 7.5 mg/dL — ABNORMAL LOW (ref 8.4–10.5)
Chloride: 105 mmol/L (ref 96–112)
GFR calc Af Amer: >90 mL/min (ref >90)
Glucose, Bld: 91 mg/dL (ref 70–99)
Potassium: 3.1 mmol/L — ABNORMAL LOW (ref 3.5–5.1)
Sodium: 137 mmol/L (ref 135–145)
Total Bilirubin: 1.1 mg/dL (ref 0.3–1.2)
Total Protein: 6.3 g/dL (ref 6.0–8.3)

## 2014-04-30 LAB — CBC
HCT: 31.6 % — ABNORMAL LOW (ref 36.0–46.0)
HCT: 32 % — ABNORMAL LOW (ref 36.0–46.0)
Hemoglobin: 10 g/dL — ABNORMAL LOW (ref 12.0–15.0)
Hemoglobin: 10.3 g/dL — ABNORMAL LOW (ref 12.0–15.0)
MCH: 26.5 pg (ref 26.0–34.0)
MCH: 27.2 pg (ref 26.0–34.0)
MCHC: 31.6 g/dL (ref 30.0–36.0)
MCHC: 32.2 g/dL (ref 30.0–36.0)
MCV: 83.6 fL (ref 78.0–100.0)
MCV: 84.4 fL (ref 78.0–100.0)
Platelets: 151 10*3/uL (ref 150–400)
Platelets: 158 10*3/uL (ref 150–400)
RBC: 3.78 MIL/uL — ABNORMAL LOW (ref 3.87–5.11)
RBC: 3.79 MIL/uL — ABNORMAL LOW (ref 3.87–5.11)
RDW: 17 % — ABNORMAL HIGH (ref 11.5–15.5)
RDW: 17.7 % — ABNORMAL HIGH (ref 11.5–15.5)
WBC: 7 10*3/uL (ref 4.0–10.5)
WBC: 11 10*3/uL — ABNORMAL HIGH (ref 4.0–10.5)

## 2014-04-30 LAB — SAVE SMEAR

## 2014-04-30 LAB — APTT: aPTT: 30 seconds (ref 24–37)

## 2014-04-30 LAB — HIV ANTIBODY (ROUTINE TESTING W REFLEX): HIV Screen 4th Generation wRfx: NONREACTIVE

## 2014-04-30 LAB — RPR: RPR Ser Ql: NONREACTIVE

## 2014-04-30 LAB — MRSA PCR SCREENING: MRSA by PCR: NEGATIVE

## 2014-04-30 LAB — FIBRINOGEN: FIBRINOGEN: 457 mg/dL (ref 204–475)

## 2014-04-30 LAB — PROTIME-INR
INR: 1.12 (ref 0.00–1.49)
Prothrombin Time: 14.5 seconds (ref 11.6–15.2)

## 2014-04-30 MED ORDER — PRENATAL MULTIVITAMIN CH
1.0000 | ORAL_TABLET | Freq: Every day | ORAL | Status: DC
  Administered 2014-05-01 – 2014-05-02 (×2): 1 via ORAL
  Filled 2014-04-30 (×2): qty 1

## 2014-04-30 MED ORDER — WITCH HAZEL-GLYCERIN EX PADS: 1.0000 "application " | MEDICATED_PAD | CUTANEOUS | Status: DC | PRN

## 2014-04-30 MED ORDER — OXYTOCIN 40 UNITS IN LACTATED RINGERS INFUSION - SIMPLE MED: 62.5000 mL/h | INTRAVENOUS | Status: DC | PRN

## 2014-04-30 MED ORDER — ONDANSETRON HCL 4 MG PO TABS: 4.0000 mg | ORAL_TABLET | ORAL | Status: DC | PRN

## 2014-04-30 MED ORDER — DIBUCAINE 1 % RE OINT
1.0000 | TOPICAL_OINTMENT | RECTAL | Status: DC | PRN
Start: 2014-04-30 — End: 2014-05-02

## 2014-04-30 MED ORDER — MISOPROSTOL 200 MCG PO TABS
1000.0000 ug | ORAL_TABLET | Freq: Once | ORAL | Status: AC
  Administered 2014-04-30: 1000 ug via RECTAL
  Filled 2014-04-30: qty 5

## 2014-04-30 MED ORDER — SENNOSIDES-DOCUSATE SODIUM 8.6-50 MG PO TABS
2.0000 | ORAL_TABLET | ORAL | Status: DC
  Administered 2014-05-02: 2 via ORAL
  Filled 2014-04-30: qty 2

## 2014-04-30 MED ORDER — TERBUTALINE SULFATE 1 MG/ML IJ SOLN: 0.2500 mg | Freq: Once | INTRAMUSCULAR | Status: DC | PRN

## 2014-04-30 MED ORDER — HYDRALAZINE HCL 20 MG/ML IJ SOLN: 10.0000 mg | Freq: Once | INTRAMUSCULAR | Status: DC | PRN

## 2014-04-30 MED ORDER — BENZOCAINE-MENTHOL 20-0.5 % EX AERO
1.0000 | INHALATION_SPRAY | CUTANEOUS | Status: DC | PRN
Start: 2014-04-30 — End: 2014-05-02
  Administered 2014-05-01 (×2): 1 via TOPICAL
  Filled 2014-04-30 (×2): qty 56

## 2014-04-30 MED ORDER — FLEET ENEMA 7-19 GM/118ML RE ENEM: 1.0000 | ENEMA | Freq: Every day | RECTAL | Status: DC | PRN

## 2014-04-30 MED ORDER — BISACODYL 10 MG RE SUPP: 10.0000 mg | Freq: Every day | RECTAL | Status: DC | PRN

## 2014-04-30 MED ORDER — LACTATED RINGERS IV SOLN
500.0000 mL | Freq: Once | INTRAVENOUS | Status: AC
  Administered 2014-04-30: 300 mL via INTRAVENOUS

## 2014-04-30 MED ORDER — FENTANYL 2.5 MCG/ML BUPIVACAINE 1/10 % EPIDURAL INFUSION (WH - ANES)
INTRAMUSCULAR | Status: DC | PRN
  Administered 2014-04-30: 14 mL/h via EPIDURAL

## 2014-04-30 MED ORDER — ONDANSETRON HCL 4 MG/2ML IJ SOLN: 4.0000 mg | INTRAMUSCULAR | Status: DC | PRN

## 2014-04-30 MED ORDER — LANOLIN HYDROUS EX OINT: TOPICAL_OINTMENT | CUTANEOUS | Status: DC | PRN

## 2014-04-30 MED ORDER — DIPHENHYDRAMINE HCL 25 MG PO CAPS: 25.0000 mg | ORAL_CAPSULE | Freq: Four times a day (QID) | ORAL | Status: DC | PRN

## 2014-04-30 MED ORDER — SIMETHICONE 80 MG PO CHEW
80.0000 mg | CHEWABLE_TABLET | ORAL | Status: DC | PRN
  Administered 2014-04-30: 80 mg via ORAL
  Filled 2014-04-30: qty 1

## 2014-04-30 MED ORDER — SODIUM CHLORIDE 0.9 % IJ SOLN
3.0000 mL | Freq: Two times a day (BID) | INTRAMUSCULAR | Status: DC
  Administered 2014-05-01: 3 mL via INTRAVENOUS

## 2014-04-30 MED ORDER — EPHEDRINE 5 MG/ML INJ
10.0000 mg | INTRAVENOUS | Status: DC | PRN
  Filled 2014-04-30: qty 2

## 2014-04-30 MED ORDER — DIPHENHYDRAMINE HCL 50 MG/ML IJ SOLN: 12.5000 mg | INTRAMUSCULAR | Status: DC | PRN

## 2014-04-30 MED ORDER — PHENYLEPHRINE 40 MCG/ML (10ML) SYRINGE FOR IV PUSH (FOR BLOOD PRESSURE SUPPORT)
80.0000 ug | PREFILLED_SYRINGE | INTRAVENOUS | Status: DC | PRN
  Filled 2014-04-30: qty 2
  Filled 2014-04-30: qty 20

## 2014-04-30 MED ORDER — ZOLPIDEM TARTRATE 5 MG PO TABS: 5.0000 mg | ORAL_TABLET | Freq: Every evening | ORAL | Status: DC | PRN

## 2014-04-30 MED ORDER — MISOPROSTOL 200 MCG PO TABS
ORAL_TABLET | ORAL | Status: AC
  Filled 2014-04-30: qty 4

## 2014-04-30 MED ORDER — LIDOCAINE HCL (PF) 1 % IJ SOLN
INTRAMUSCULAR | Status: DC | PRN
  Administered 2014-04-30 (×2): 4 mL

## 2014-04-30 MED ORDER — FENTANYL 2.5 MCG/ML BUPIVACAINE 1/10 % EPIDURAL INFUSION (WH - ANES)
14.0000 mL/h | INTRAMUSCULAR | Status: DC | PRN
  Administered 2014-04-30: 14 mL/h via EPIDURAL
  Filled 2014-04-30 (×2): qty 125

## 2014-04-30 MED ORDER — OXYTOCIN 40 UNITS IN LACTATED RINGERS INFUSION - SIMPLE MED
1.0000 m[IU]/min | INTRAVENOUS | Status: DC
  Administered 2014-04-30: 2 m[IU]/min via INTRAVENOUS

## 2014-04-30 MED ORDER — MISOPROSTOL 25 MCG QUARTER TABLET: 10000.0000 ug | ORAL_TABLET | Freq: Once | ORAL | Status: DC

## 2014-04-30 MED ORDER — SODIUM CHLORIDE 0.9 % IV SOLN: 250.0000 mL | INTRAVENOUS | Status: DC | PRN

## 2014-04-30 MED ORDER — CARBOPROST TROMETHAMINE 250 MCG/ML IM SOLN
INTRAMUSCULAR | Status: AC
  Administered 2014-04-30: 250 ug via INTRAMUSCULAR
  Filled 2014-04-30: qty 1

## 2014-04-30 MED ORDER — PHENYLEPHRINE 40 MCG/ML (10ML) SYRINGE FOR IV PUSH (FOR BLOOD PRESSURE SUPPORT)
80.0000 ug | PREFILLED_SYRINGE | INTRAVENOUS | Status: DC | PRN
  Filled 2014-04-30: qty 2

## 2014-04-30 MED ORDER — LABETALOL HCL 5 MG/ML IV SOLN: 20.0000 mg | INTRAVENOUS | Status: DC | PRN

## 2014-04-30 MED ORDER — IBUPROFEN 600 MG PO TABS
600.0000 mg | ORAL_TABLET | Freq: Four times a day (QID) | ORAL | Status: DC
  Administered 2014-04-30 – 2014-05-02 (×7): 600 mg via ORAL
  Filled 2014-04-30 (×7): qty 1

## 2014-04-30 MED ORDER — SODIUM CHLORIDE 0.9 % IJ SOLN: 3.0000 mL | INTRAMUSCULAR | Status: DC | PRN

## 2014-04-30 MED ORDER — CARBOPROST TROMETHAMINE 250 MCG/ML IM SOLN
250.0000 ug | INTRAMUSCULAR | Status: DC | PRN
  Administered 2014-04-30: 250 ug via INTRAMUSCULAR

## 2014-04-30 MED ORDER — MAGNESIUM SULFATE 40 G IN LACTATED RINGERS - SIMPLE
2.0000 g/h | INTRAVENOUS | Status: AC
  Filled 2014-04-30: qty 500

## 2014-04-30 NOTE — Progress Notes (Signed)
   [name] is a 29 y.o. [id number] at 4373w6d  admitted for induction of labor due to Pre-eclamptic toxemia of pregnancy..  Subjective:  No HA Objective: Filed Vitals:   04/30/14 0200 04/30/14 0300 04/30/14 0500 04/30/14 0600  BP: 121/78 129/81 127/83 135/93  Pulse: 94 95 84 83  Temp: 97.9 F (36.6 C)     TempSrc: Axillary     Resp: 16 16 16 18   Height:      Weight:       Total I/O In: 1975 [P.O.:600; I.V.:1375] Out: 2000 [Urine:2000]  FHT:  FHR: 145 bpm, variability: moderate,  accelerations:  Present,  decelerations:  Absent UC:   irregular, every 2-6 minutes SVE:   1/60/-3 Foley inserted and inflated with 60cc H20  Labs: Lab Results  Component Value Date   WBC 8.2 04/29/2014   HGB 10.2* 04/29/2014   HCT 32.0* 04/29/2014   MCV 83.1 04/29/2014   PLT 163 04/29/2014    Assessment / Plan: IOL for severe preX, ripening well Will start pitocin Labor: Progressing normally Fetal Wellbeing:  Category I Pain Control:  Labor support without medications Anticipated MOD:  NSVD  CRESENZO-DISHMAN,Ilean Spradlin 04/30/2014, 7:00 AM

## 2014-04-30 NOTE — Anesthesia Preprocedure Evaluation (Addendum)
Anesthesia Evaluation  Patient identified by MRN, date of birth, ID band Patient awake    Reviewed: Allergy & Precautions, NPO status , Patient's Chart, lab work & pertinent test results  Airway Mallampati: III  TM Distance: >3 FB Neck ROM: Full    Dental no notable dental hx. (+) Teeth Intact   Pulmonary neg pulmonary ROS,  breath sounds clear to auscultation  Pulmonary exam normal       Cardiovascular hypertension, Pt. on medications and Pt. on home beta blockers Rhythm:Regular Rate:Normal     Neuro/Psych negative neurological ROS  negative psych ROS   GI/Hepatic Neg liver ROS, GERD-  ,  Endo/Other  negative endocrine ROS  Renal/GU negative Renal ROS     Musculoskeletal negative musculoskeletal ROS (+)   Abdominal   Peds  Hematology  (+) anemia ,   Anesthesia Other Findings   Reproductive/Obstetrics (+) Pregnancy Pre eclampsia 33 5/7 weeks being induced for HTN                            Anesthesia Physical Anesthesia Plan  ASA: III  Anesthesia Plan: Epidural   Post-op Pain Management:    Induction:   Airway Management Planned: Natural Airway  Additional Equipment:   Intra-op Plan:   Post-operative Plan:   Informed Consent: I have reviewed the patients History and Physical, chart, labs and discussed the procedure including the risks, benefits and alternatives for the proposed anesthesia with the patient or authorized representative who has indicated his/her understanding and acceptance.     Plan Discussed with: Anesthesiologist  Anesthesia Plan Comments:         Anesthesia Quick Evaluation

## 2014-04-30 NOTE — Progress Notes (Signed)
Labor progress note  Notified by RN that patient having vaginal bleeding.  Patient concerned that she is going to bleed to death, had PPH with transfusion with previous delivery.  BP 119/87 mmHg  Pulse 83  Temp(Src) 97.9 F (36.6 C) (Oral)  Resp 16  Ht 5\' 7"  (1.702 m)  Wt 188 lb (85.276 kg)  BMI 29.44 kg/m2  SpO2 96%  Dilation: 7.5 Effacement (%): 90 Cervical Position: Middle Station: -1 Presentation: Vertex Exam by:: Dr. Loreta AveAcosta AROM ?clear vs blood tinged from blood in vagina.  Significant amount of bleeding on towel and pooling, EBL currently about 200cc.  Cat I strip, no accelerations  HELLP labs now Suspect bleeding 2/2 rapid cervical change, may also be due to abruption  Perry MountACOSTA,Rebekah Smith ROCIO, MD

## 2014-04-30 NOTE — Progress Notes (Signed)
FB out, pt comfortable with epidural denies HA/RUQ/visual changes.  BP 145/99 mmHg  Pulse 79  Temp(Src) 97.9 F (36.6 C) (Axillary)  Resp 12  Ht 5\' 7"  (1.702 m)  Wt 188 lb (85.276 kg)  BMI 29.44 kg/m2  SpO2 96%  Dilation: 4 Effacement (%): 50 Cervical Position: Posterior Station: -3 Presentation: Vertex Exam by:: Marijean HeathM. Robinson, RN I was able to confirm vertex on exam  Start pitocin for continued induction for preEclampsia with severe features - continue magnesium - labetalol protocol for some severe range BP  Gayatri Teasdale ROCIO, MD

## 2014-04-30 NOTE — Consult Note (Signed)
Neonatology Note:   Attendance at Delivery:   I was asked by Dr. Acosta to attend this NSVD at 33 6/7 weeks following IOL for pre-eclampsia. The mother is a G4P2A1 O pos, GBS neg with chronic hypertension with superimposed pre-eclampsia, on Labetalol. She received BMZ on 1/21-22 and got neuroprotective magnesium sulfate. ROM 1 hour prior to delivery, fluid clear. Infant vigorous with good spontaneous cry and tone. Needed only minimal bulb suctioning. Ap 9/9. Lungs clear to ausc in DR. I spoke with his parents, who were able to hold him briefly, then we transported him to NICU for further care, with his father in attendance.  Ofilia Rayon C. Rebekah Betzold, MD 

## 2014-04-30 NOTE — Anesthesia Procedure Notes (Signed)
Epidural Patient location during procedure: OB Start time: 04/30/2014 8:29 AM  Staffing Anesthesiologist: Takai Chiaramonte A. Performed by: anesthesiologist   Preanesthetic Checklist Completed: patient identified, site marked, surgical consent, pre-op evaluation, timeout performed, IV checked, risks and benefits discussed and monitors and equipment checked  Epidural Patient position: sitting Prep: site prepped and draped and DuraPrep Patient monitoring: continuous pulse ox and blood pressure Approach: midline Location: L4-L5 Injection technique: LOR air  Needle:  Needle type: Tuohy  Needle gauge: 17 G Needle length: 9 cm and 9 Needle insertion depth: 6 cm Catheter type: closed end flexible Catheter size: 19 Gauge Catheter at skin depth: 11 cm Test dose: negative and Other  Assessment Events: blood not aspirated, injection not painful, no injection resistance, negative IV test and no paresthesia  Additional Notes Patient identified. Risks and benefits discussed including failed block, incomplete  Pain control, post dural puncture headache, nerve damage, paralysis, blood pressure Changes, nausea, vomiting, reactions to medications-both toxic and allergic and post Partum back pain. All questions were answered. Patient expressed understanding and wished to proceed. Sterile technique was used throughout procedure. Epidural site was Dressed with sterile barrier dressing. No paresthesias, signs of intravascular injection Or signs of intrathecal spread were encountered.  Patient was more comfortable after the epidural was dosed. Please see RN's note for documentation of vital signs and FHR which are stable.

## 2014-05-01 LAB — CBC
HCT: 23.8 % — ABNORMAL LOW (ref 36.0–46.0)
HEMOGLOBIN: 7.6 g/dL — AB (ref 12.0–15.0)
MCH: 26.9 pg (ref 26.0–34.0)
MCHC: 31.9 g/dL (ref 30.0–36.0)
MCV: 84.1 fL (ref 78.0–100.0)
PLATELETS: 128 10*3/uL — AB (ref 150–400)
RBC: 2.83 MIL/uL — ABNORMAL LOW (ref 3.87–5.11)
RDW: 17.8 % — AB (ref 11.5–15.5)
WBC: 7.8 10*3/uL (ref 4.0–10.5)

## 2014-05-01 MED ORDER — MEASLES, MUMPS & RUBELLA VAC ~~LOC~~ INJ
0.5000 mL | INJECTION | Freq: Once | SUBCUTANEOUS | Status: AC
  Administered 2014-05-02: 0.5 mL via SUBCUTANEOUS
  Filled 2014-05-01 (×2): qty 0.5

## 2014-05-01 MED ORDER — POTASSIUM CHLORIDE CRYS ER 20 MEQ PO TBCR
40.0000 meq | EXTENDED_RELEASE_TABLET | Freq: Once | ORAL | Status: AC
  Administered 2014-05-01: 40 meq via ORAL
  Filled 2014-05-01: qty 2

## 2014-05-01 MED ORDER — OXYCODONE-ACETAMINOPHEN 5-325 MG PO TABS
1.0000 | ORAL_TABLET | ORAL | Status: DC | PRN
  Administered 2014-05-01 – 2014-05-02 (×3): 1 via ORAL
  Filled 2014-05-01 (×3): qty 1

## 2014-05-01 MED ORDER — LACTATED RINGERS IV SOLN
INTRAVENOUS | Status: DC
  Administered 2014-05-01 (×2): via INTRAVENOUS

## 2014-05-01 NOTE — Progress Notes (Signed)
Post Partum Day #1 Subjective: up ad lib, voiding and tolerating PO. Pt reports mild HA. No further bleeding.  No difficulty breathing.   Objective: Blood pressure 126/77, pulse 73, temperature 98.6 F (37 C), temperature source Oral, resp. rate 20, height 5\' 7"  (1.702 m), weight 176 lb 6.4 oz (80.015 kg), SpO2 97 %, unknown if currently breastfeeding.  Physical Exam:  General: alert and no distress Lochia: appropriate Uterine Fundus: firm DVT Evaluation: No evidence of DVT seen on physical exam.   Recent Labs  04/30/14 0730 04/30/14 1544  HGB 10.0* 10.3*  HCT 31.6* 32.0*    Assessment/Plan: Severe preeclampsia currently on Magnesium sulfate. Doing well no s/sx of toxicity Discontinue Magnesium after 24 hours and transfer to mother/baby unit Address contraception in am Baby in NICU CBC pending  LOS: 2 days   HARRAWAY-SMITH, Norvell Caswell 05/01/2014, 6:50 AM

## 2014-05-01 NOTE — Lactation Note (Signed)
This note was copied from the chart of Rebekah Smith. Lactation Consultation Note  Initial visit made.  Mom has been pumping breasts every 3 hours and obtaining small amounts of colostrum.  Mom breastfed 2 previous term babies without problems.  Reviewed basics and answered questions.  [facility] use after discharge.  Pump loaner program discussed.  Providing breastmilk for your baby in NICU at the bedside.  Encouraged to call with concerns/assist prn.  Patient Name: Rebekah Smith ZOXWR'UToday's Date: 05/01/2014 Reason for consult: Initial assessment;NICU baby   Maternal Data Has patient been taught Hand Expression?: Yes Does the patient have breastfeeding experience prior to this delivery?: No  Feeding    LATCH Score/Interventions                      Lactation Tools Discussed/Used WIC Program: No Pump Review: Setup, frequency, and cleaning;Milk Storage Initiated by:: RN Date initiated:: 04/30/14   Consult Status Consult Status: Follow-up Date: 05/02/14 Follow-up type: In-patient    Huston FoleyMOULDEN, [place] S 05/01/2014, 2:57 PM

## 2014-05-01 NOTE — Lactation Note (Deleted)
This note was copied from the chart of Rebekah Smith. Lactation Consultation Note  Initial visit made.  Mom is pumping on preemie setting every 3 hours but not obtaining colostrum.  Reviewed supply and demand and milk coming to volume.  Mom states she understands it may take some time and she is not worried.  Instructed on hand expression and mom returned demonstration.  Glistening noted on end of nipple.  Reviewed basics and answered questions.  Mom will contact her insurance company about a pump for home use.  Informed of 2 week rental.  Mom has Providing your Baby with Breastmilk booklet at bedside.  Patient Name: Rebekah Smith Rebekah Smith's Date: 05/01/2014 Reason for consult: Initial assessment;NICU baby   Maternal Data Has patient been taught Hand Expression?: Yes Does the patient have breastfeeding experience prior to this delivery?: No  Feeding    LATCH Score/Interventions                      Lactation Tools Discussed/Used WIC Program: No Pump Review: Setup, frequency, and cleaning;Milk Storage Initiated by:: RN Date initiated:: 04/30/14   Consult Status Consult Status: Follow-up Date: 05/02/14 Follow-up type: In-patient    Huston FoleyMOULDEN, Londen Bok S 05/01/2014, 2:50 PM

## 2014-05-01 NOTE — Anesthesia Postprocedure Evaluation (Signed)
  Anesthesia Post-op Note  Anesthesia Post Note  Patient: Rebekah Smith  Procedure(s) Performed: * No procedures listed *  Anesthesia type: Epidural  Patient location: Mother/Baby  Post pain: Pain level controlled  Post assessment: Post-op Vital signs reviewed  Last Vitals:  Filed Vitals:   05/01/14 0700  BP: 109/61  Pulse: 86  Temp:   Resp:     Post vital signs: Reviewed  Level of consciousness:alert  Complications: No apparent anesthesia complications

## 2014-05-02 MED ORDER — IBUPROFEN 600 MG PO TABS: 600.0000 mg | ORAL_TABLET | Freq: Four times a day (QID) | ORAL | Status: DC

## 2014-05-02 NOTE — Progress Notes (Signed)
Discharge instructions reviewed with patient.  Patient states understanding of home care, medications, activity, signs/symptoms to report to MD and return MD office visit.  Patients significant other and family will assist with her care @ home.  No home equipment needed, patient has all personal belongings.  Patient ambulated for discharge in stable condition with staff without incident.

## 2014-05-02 NOTE — Lactation Note (Signed)
This note was copied from the chart of Rebekah QatarBrittany Chilton. Lactation Consultation Note  Follow up visit made.  Mom is pumping every 3 hours and obtaining transitional milk.  [facility]  Encouraged to call with concerns prn.  Patient Name: Rebekah Cherre RobinsBrittany Chilton ZOXWR'UToday's Date: 05/02/2014     Maternal Data    Feeding Feeding Type: Breast Milk with Formula added Length of feed: 30 min  LATCH Score/Interventions                      Lactation Tools Discussed/Used     Consult Status      Huston FoleyMOULDEN, Alyxandria Wentz S 05/02/2014, 2:57 PM

## 2014-05-02 NOTE — Discharge Summary (Signed)
Obstetric Discharge Summary Reason for Admission: onset of labor and preterm labor and delivery Prenatal Procedures: ultrasound Intrapartum Procedures: spontaneous vaginal delivery Postpartum Procedures: none Complications-Operative and Postpartum: none HEMOGLOBIN  Date Value Ref Range Status  05/01/2014 7.6* 12.0 - 15.0 g/dL Final    Comment:    DELTA CHECK NOTED REPEATED TO VERIFY    HCT  Date Value Ref Range Status  05/01/2014 23.8* 36.0 - 46.0 % Final   HEMATOCRIT  Date Value Ref Range Status  04/24/2014 25.5* 34.0 - 46.6 % Final    Comment:    (NOTE) Inadequately filled tube received.  Whole blood to anticoagulant ratio may adversely affect results.     Physical Exam:  General: alert, cooperative, appears stated age and no distress Lochia: appropriate Uterine Fundus: firm Incision: n/a DVT Evaluation: No evidence of DVT seen on physical exam. Negative Homan's sign. No cords or calf tenderness. No significant calf/ankle edema.  Discharge Diagnoses: Premature labor  Discharge Information: Date: 05/02/2014 Activity: pelvic rest Diet: routine Medications: PNV, Ibuprofen and Percocet Condition: stable and improved Instructions: refer to practice specific booklet Discharge to: home   Newborn Data: Live born female  Birth Weight: 6 lb 2.1 oz (2781 g) APGAR: 9, 9  Home with NICU.  LAWSON, MARIE DARLENE 05/02/2014, 8:40 AM

## 2014-05-02 NOTE — Progress Notes (Signed)
Post Partum Day 1 Subjective: no complaints, up ad lib, voiding and tolerating PO  Denies lightheadedness and dizziness when ambulating.  Objective: Blood pressure 130/74, pulse 81, temperature 98.1 F (36.7 C), temperature source Oral, resp. rate 17, height 5\' 7"  (1.702 m), weight 176 lb 6.4 oz (80.015 kg), SpO2 99 %, unknown if currently breastfeeding.  Physical Exam:  General: alert, cooperative and no distress Lochia: appropriate Uterine Fundus: firm DVT Evaluation: No evidence of DVT seen on physical exam. Negative Homan's sign. No cords or calf tenderness.   Recent Labs  04/30/14 1544 05/01/14 0638  HGB 10.3* 7.6*  HCT 32.0* 23.8*    Assessment/Plan: Plan for discharge tomorrow  Continue pumping.   Hemodynamically stable.   LOS: 3 days   STINSON, JACOB JEHIEL 05/02/2014, 6:51 AM

## 2014-05-03 ENCOUNTER — Ambulatory Visit: Payer: Self-pay

## 2014-05-03 LAB — TYPE AND SCREEN
ABO/RH(D): O POS
Antibody Screen: POSITIVE
DAT, IgG: NEGATIVE
Unit division: 0
Unit division: 0

## 2014-05-03 NOTE — Lactation Note (Signed)
This note was copied from the chart of Rebekah Smith. Lactation Consultation Note    I assisted mom with pumping, now 70 hours post partum, and severely engorged. Mom was visiting her baby in the NICU. I massaged her breast while she pumped and held ice under her breasts. Mom was able to express almost 5 ounces of transitional milk, and felt much better after this. i also gave mom comfort gels and instructed her in their use, and increased her to size 30 flanges, which also helped. Reverse massage explained to mom to try once home, and lying flat in bed, if needed. Mom knows to have lactation called as needed. Mom would like to breast feed [name] tomorrow, and will make an appointment, once she knows what time would be best.   Patient Name: Rebekah Smith ZHYQM'VToday's Date: 05/03/2014 Reason for consult: Follow-up assessment   Maternal Data    Feeding Feeding Type: Breast Milk Nipple Type: Slow - flow Length of feed: 30 min  LATCH Score/Interventions          Comfort (Breast/Nipple): Engorged, cracked, bleeding, large blisters, severe discomfort Problem noted: Engorgment (severe discomfort) Intervention(s): Ice;Hand expression;Cabbage leaves;Other (comment) (massage with pumping)           Lactation Tools Discussed/Used WIC Program: Yes   Consult Status Consult Status: PRN Follow-up type: In-patient (NICU)    Alfred LevinsLee, Shequila Neglia Anne 05/03/2014, 3:39 PM

## 2014-05-03 NOTE — Progress Notes (Signed)
Ur chart review completed.  

## 2014-05-05 ENCOUNTER — Ambulatory Visit: Payer: Self-pay

## 2014-05-05 NOTE — Lactation Note (Signed)
This note was copied from the chart of Rebekah QatarBrittany Smith. Lactation Consultation Note NICU RN requests comfort gels for mom per moms request.  Pecola LeisureBaby is now 755 days old and mom does not report any additional problems.  Encouraged NICU RN to have mom contact LC phone services or NICU LC when available for follow up and comfort gels were tubed to NICU per request.   Patient Name: Rebekah Smith Today's Date: 05/05/2014     Maternal Data    Feeding Feeding Type: Breast Milk Nipple Type: Slow - flow Length of feed: 30 min  LATCH Score/Interventions                      Lactation Tools Discussed/Used     Consult Status      Rebekah Smith, Arvella MerlesJana Smith 05/05/2014, 9:23 PM

## 2014-05-11 ENCOUNTER — Encounter: Payer: Self-pay | Admitting: Obstetrics & Gynecology

## 2014-05-11 ENCOUNTER — Ambulatory Visit (INDEPENDENT_AMBULATORY_CARE_PROVIDER_SITE_OTHER): Payer: Medicaid Other | Admitting: Obstetrics & Gynecology

## 2014-05-11 VITALS — BP 123/73 | HR 105 | Temp 97.6°F | Resp 16 | Ht 66.0 in | Wt 160.0 lb

## 2014-05-11 DIAGNOSIS — N61 Inflammatory disorders of breast: Secondary | ICD-10-CM

## 2014-05-11 DIAGNOSIS — O9112 Abscess of breast associated with the puerperium: Secondary | ICD-10-CM

## 2014-05-11 MED ORDER — CEPHALEXIN 500 MG PO CAPS: 500.0000 mg | ORAL_CAPSULE | Freq: Three times a day (TID) | ORAL | Status: DC

## 2014-05-11 NOTE — Progress Notes (Signed)
   Subjective:    Patient ID: Rebekah Smith, female    DOB: 1986-01-30, 29 y.o.   MRN: [id number]  HPI  29 yo P3 is here 1 days pp with a fever at home of "102". She took 600 mg of IBU followed by another 600mg  a few hours later. She is pumping because the baby is in the NICU. He is doing well.  Review of Systems     Objective:   Physical Exam [id number], looks tired Breathing normally Temp normal here Breasts engorged and dripping milk bilaterally No obvious mastitis yet, but the right breast does have a slight bit of erythema of the inner upper quadrant       Assessment & Plan:  Possible early mastitis-rec warm compresses, frequent pumping, and Keflex 500mg  TID for a week RTC early next week if no better by then

## 2014-05-13 ENCOUNTER — Ambulatory Visit: Payer: Self-pay

## 2014-05-13 NOTE — Lactation Note (Signed)
This note was copied from the chart of Rebekah Smith. Lactation Consultation Note  Patient Name: Rebekah Smith Today's Date: 05/13/2014  NICU baby, 13 days of life, 1260w5d CGA, weighing 6lb1.7oz. Mom at baby's bedside. LC asked how pumping going. Mom states that she is on antibiotics for mastitis in right breast. Mom states that she recognized symptoms early because she had mastitis in left breast and developed an abscess 6 years ago with first child. Enc mom to massage any knotty areas while pumping. Also enc mom to add a pumping session or two each day in order to increase her supply as she states her supply a little lower since the mastitis. Enc mom to call for assistance as needed.    Maternal Data    Feeding Feeding Type: Breast Milk with Formula added Nipple Type: Slow - flow Length of feed: 45 min  LATCH Score/Interventions                      Lactation Tools Discussed/Used     Consult Status      Rebekah Smith, Rebekah Smith 05/13/2014, 2:05 PM

## 2014-06-11 ENCOUNTER — Encounter: Payer: Self-pay | Admitting: Advanced Practice Midwife

## 2014-06-11 ENCOUNTER — Other Ambulatory Visit (HOSPITAL_COMMUNITY)
Admission: RE | Admit: 2014-06-11 | Discharge: 2014-06-11 | Disposition: A | Discharge status: Home / Self Care | Payer: Medicaid Other | Source: Ambulatory Visit | Attending: Advanced Practice Midwife | Admitting: Advanced Practice Midwife

## 2014-06-11 ENCOUNTER — Ambulatory Visit (INDEPENDENT_AMBULATORY_CARE_PROVIDER_SITE_OTHER): Payer: Medicaid Other | Admitting: Advanced Practice Midwife

## 2014-06-11 VITALS — Ht 67.0 in | Wt 155.0 lb

## 2014-06-11 DIAGNOSIS — Z01411 Encounter for gynecological examination (general) (routine) with abnormal findings: Secondary | ICD-10-CM | POA: Diagnosis present

## 2014-06-11 DIAGNOSIS — D62 Acute posthemorrhagic anemia: Secondary | ICD-10-CM

## 2014-06-11 DIAGNOSIS — R001 Bradycardia, unspecified: Secondary | ICD-10-CM

## 2014-06-11 DIAGNOSIS — I499 Cardiac arrhythmia, unspecified: Secondary | ICD-10-CM

## 2014-06-11 DIAGNOSIS — R5383 Other fatigue: Secondary | ICD-10-CM

## 2014-06-11 DIAGNOSIS — Z30011 Encounter for initial prescription of contraceptive pills: Secondary | ICD-10-CM

## 2014-06-11 LAB — COMPREHENSIVE METABOLIC PANEL
ALBUMIN: 4.5 g/dL (ref 3.5–5.2)
ALT: 8 U/L (ref 0–35)
AST: 14 U/L (ref 0–37)
Alkaline Phosphatase: 77 U/L (ref 39–117)
BUN: 19 mg/dL (ref 6–23)
CO2: 24 meq/L (ref 19–32)
Calcium: 9 mg/dL (ref 8.4–10.5)
Chloride: 104 mEq/L (ref 96–112)
Creat: 0.75 mg/dL (ref 0.50–1.10)
Glucose, Bld: 78 mg/dL (ref 70–99)
Potassium: 4.3 mEq/L (ref 3.5–5.3)
SODIUM: 139 meq/L (ref 135–145)
Total Bilirubin: 0.4 mg/dL (ref 0.2–1.2)
Total Protein: 6.6 g/dL (ref 6.0–8.3)

## 2014-06-11 LAB — CBC
HEMATOCRIT: 37.8 % (ref 36.0–46.0)
Hemoglobin: 12.2 g/dL (ref 12.0–15.0)
MCH: 26 pg (ref 26.0–34.0)
MCHC: 32.3 g/dL (ref 30.0–36.0)
MCV: 80.4 fL (ref 78.0–100.0)
MPV: 9.9 fL (ref 8.6–12.4)
Platelets: 261 10*3/uL (ref 150–400)
RBC: 4.7 MIL/uL (ref 3.87–5.11)
RDW: 16 % — ABNORMAL HIGH (ref 11.5–15.5)
WBC: 5 10*3/uL (ref 4.0–10.5)

## 2014-06-11 LAB — TSH: TSH: 0.885 u[IU]/mL (ref 0.350–4.500)

## 2014-06-11 MED ORDER — NORETHINDRONE 0.35 MG PO TABS: 1.0000 | ORAL_TABLET | Freq: Every day | ORAL | Status: DC

## 2014-06-11 NOTE — Patient Instructions (Addendum)
Start over-the-counter iron supplement and increase dietary iron.   Palpitations A palpitation is the feeling that your heartbeat is irregular or is faster than normal. It may feel like your heart is fluttering or skipping a beat. Palpitations are usually not a serious problem. However, in some cases, you may need further medical evaluation. CAUSES  Palpitations can be caused by:  Smoking.  Caffeine or other stimulants, such as diet pills or energy drinks.  Alcohol.  Stress and anxiety.  Strenuous physical activity.  Fatigue.  Certain medicines.  Heart disease, especially if you have a history of irregular heart rhythms (arrhythmias), such as atrial fibrillation, atrial flutter, or supraventricular tachycardia.  An improperly working pacemaker or defibrillator. DIAGNOSIS  To find the cause of your palpitations, your health care provider will take your medical history and perform a physical exam. Your health care provider may also have you take a test called an ambulatory electrocardiogram (ECG). An ECG records your heartbeat patterns over a 24-hour period. You may also have other tests, such as:  Transthoracic echocardiogram (TTE). During echocardiography, sound waves are used to evaluate how blood flows through your heart.  Transesophageal echocardiogram (TEE).  Cardiac monitoring. This allows your health care provider to monitor your heart rate and rhythm in real time.  Holter monitor. This is a portable device that records your heartbeat and can help diagnose heart arrhythmias. It allows your health care provider to track your heart activity for several days, if needed.  Stress tests by exercise or by giving medicine that makes the heart beat faster. TREATMENT  Treatment of palpitations depends on the cause of your symptoms and can vary greatly. Most cases of palpitations do not require any treatment other than time, relaxation, and monitoring your symptoms. Other causes, such  as atrial fibrillation, atrial flutter, or supraventricular tachycardia, usually require further treatment. HOME CARE INSTRUCTIONS   Avoid:  Caffeinated coffee, tea, soft drinks, diet pills, and energy drinks.  Chocolate.  Alcohol.  Stop smoking if you smoke.  Reduce your stress and anxiety. Things that can help you relax include:  A method of controlling things in your body, such as your heartbeats, with your mind (biofeedback).  Yoga.  Meditation.  Physical activity such as swimming, jogging, or walking.  Get plenty of rest and sleep. SEEK MEDICAL CARE IF:   You continue to have a fast or irregular heartbeat beyond 24 hours.  Your palpitations occur more often. SEEK IMMEDIATE MEDICAL CARE IF:  You have chest pain or shortness of breath.  You have a severe headache.  You feel dizzy or you faint. MAKE SURE YOU:  Understand these instructions.  Will watch your condition.  Will get help right away if you are not doing well or get worse. Document Released: 03/16/2000 Document Revised: 03/24/2013 Document Reviewed: 05/18/2011 [facility] Patient Information 2015 [place]. This information is not intended to replace advice given to you by your health care provider. Make sure you discuss any questions you have with your health care provider.

## 2014-06-11 NOTE — Progress Notes (Signed)
Patient ID: Rebekah Smith, female   DOB: 1985/04/12, 29 y.o.   MRN: [id number] Post Partum Exam  Rebekah Smith is a 29 y.o. female G4P2 who presents for a postpartum visit. She is 6 weeks postpartum following a spontaneous vaginal delivery. I have fully reviewed the prenatal and intrapartum course. The delivery was at 70w6dgestational weeks. She was induced for severe Pre-E. Had PEast [place] Outcome: spontaneous vaginal delivery. Anesthesia: epidural. Postpartum course has been positive for irregular heart rate, dizzy spells, [facility] . Baby's course has been complicated by NICU stay for prematurity, but otherwise unremarkable. Baby is feeding by both breast and bottle - breast milk only. Bleeding no bleeding. Bowel function is normal. Bladder function is normal. Patient is not sexually active. Contraception method: planning POPs. Postpartum depression screening: negative.  The following portions of the patient's history were reviewed and updated as appropriate: allergies, current medications, past family history, past medical history, past social history, past surgical history and problem list.  Review of Systems Review of Systems  Constitutional: Positive for malaise/fatigue. Negative for fever and chills.  Gastrointestinal: Negative for abdominal pain.  Genitourinary:       Negative for vaginal bleeding. Has not resumed menses.  Neurological: Positive for dizziness. Negative for headaches.   Objective:    BP 116/78 mmHg  Pulse 78  Resp 16  Ht 5' 5"  (1.651 m)  Wt 211 lb (95.709 kg)  BMI 35.11 kg/m2  Breastfeeding? Yes  General:  alert, cooperative, appears stated age, no distress and no pallor. Fatigued-appearing.   Breasts:  Declined exam  Lungs: clear to auscultation bilaterally  Heart:  regular rate and rhythm, S1, S2 normal, no murmur, click, rub or gallop  Abdomen: soft, non-tender; bowel sounds normal; no masses,  no organomegaly   Vulva:  Normal  Vagina: Physiologic discharge. No lesions or blood.   Cervix:  multiparous appearance and scant bleeding following Pap  Corpus: normal size, contour, position, consistency, mobility, non-tender  Adnexa:  no mass, fullness, tenderness  Rectal Exam: Not performed.       Thyroid normal size, no nodules  Assessment:   Pap smear done at today's visit.  1. Anemia due to blood loss, acute  - CBC - Cytology - PAP  2. Irregular heart beat  - Comp Met (CMET) - TSH - Cytology - PAP  3. Other fatigue  - Comp Met (CMET) - TSH - Cytology - PAP  4. Bradycardia  - Comp Met (CMET) - TSH - Cytology - PAP  5. Oral contraceptive prescribed  - norethindrone (ORTHO MICRONOR) 0.35 MG tablet; Take 1 tablet (0.35 mg total) by mouth daily.  Dispense: 1 Package; Refill: 12 - Cytology - PAP  Plan:    1. Contraception: Micronor 2. Cardiology referral PRN. 3. Follow up in: 6 weeks or as needed.  4. Increase dietary iron.

## 2014-06-14 LAB — CYTOLOGY - PAP

## 2014-06-29 ENCOUNTER — Encounter: Payer: Self-pay | Admitting: Obstetrics & Gynecology

## 2014-06-29 ENCOUNTER — Encounter: Payer: Self-pay | Admitting: *Deleted

## 2014-12-20 ENCOUNTER — Emergency Department (HOSPITAL_BASED_OUTPATIENT_CLINIC_OR_DEPARTMENT_OTHER): Payer: Medicaid Other

## 2014-12-20 ENCOUNTER — Emergency Department (HOSPITAL_BASED_OUTPATIENT_CLINIC_OR_DEPARTMENT_OTHER)
Admission: EM | Admit: 2014-12-20 | Discharge: 2014-12-20 | Disposition: A | Discharge status: Home / Self Care | Payer: Medicaid Other | Attending: Emergency Medicine | Admitting: Emergency Medicine

## 2014-12-20 ENCOUNTER — Encounter (HOSPITAL_BASED_OUTPATIENT_CLINIC_OR_DEPARTMENT_OTHER): Payer: Self-pay | Admitting: Emergency Medicine

## 2014-12-20 DIAGNOSIS — R109 Unspecified abdominal pain: Secondary | ICD-10-CM | POA: Diagnosis present

## 2014-12-20 DIAGNOSIS — I10 Essential (primary) hypertension: Secondary | ICD-10-CM | POA: Insufficient documentation

## 2014-12-20 DIAGNOSIS — N201 Calculus of ureter: Secondary | ICD-10-CM | POA: Diagnosis not present

## 2014-12-20 DIAGNOSIS — Z793 Long term (current) use of hormonal contraceptives: Secondary | ICD-10-CM | POA: Insufficient documentation

## 2014-12-20 DIAGNOSIS — Z3202 Encounter for pregnancy test, result negative: Secondary | ICD-10-CM | POA: Diagnosis not present

## 2014-12-20 DIAGNOSIS — Z862 Personal history of diseases of the blood and blood-forming organs and certain disorders involving the immune mechanism: Secondary | ICD-10-CM | POA: Insufficient documentation

## 2014-12-20 DIAGNOSIS — R319 Hematuria, unspecified: Secondary | ICD-10-CM

## 2014-12-20 LAB — PREGNANCY, URINE: Preg Test, Ur: NEGATIVE

## 2014-12-20 LAB — CBC WITH DIFFERENTIAL/PLATELET
BASOS ABS: 0 10*3/uL (ref 0.0–0.1)
BASOS PCT: 1 %
EOS ABS: 0.1 10*3/uL (ref 0.0–0.7)
Eosinophils Relative: 2 %
HEMATOCRIT: 38.3 % (ref 36.0–46.0)
Hemoglobin: 12.9 g/dL (ref 12.0–15.0)
Lymphocytes Relative: 40 %
Lymphs Abs: 2.5 10*3/uL (ref 0.7–4.0)
MCH: 28.9 pg (ref 26.0–34.0)
MCHC: 33.7 g/dL (ref 30.0–36.0)
MCV: 85.7 fL (ref 78.0–100.0)
Monocytes Absolute: 0.5 10*3/uL (ref 0.1–1.0)
Monocytes Relative: 9 %
NEUTROS ABS: 3 10*3/uL (ref 1.7–7.7)
NEUTROS PCT: 48 %
Platelets: 264 10*3/uL (ref 150–400)
RBC: 4.47 MIL/uL (ref 3.87–5.11)
RDW: 12.4 % (ref 11.5–15.5)
WBC: 6.2 10*3/uL (ref 4.0–10.5)

## 2014-12-20 LAB — URINALYSIS, ROUTINE W REFLEX MICROSCOPIC
Bilirubin Urine: NEGATIVE
Glucose, UA: NEGATIVE mg/dL
Ketones, ur: NEGATIVE mg/dL
LEUKOCYTES UA: NEGATIVE
NITRITE: NEGATIVE
PH: 6.5 (ref 5.0–8.0)
Protein, ur: 100 mg/dL — AB
SPECIFIC GRAVITY, URINE: 1.025 (ref 1.005–1.030)
Urobilinogen, UA: 1 mg/dL (ref 0.0–1.0)

## 2014-12-20 LAB — BASIC METABOLIC PANEL
ANION GAP: 6 (ref 5–15)
BUN: 17 mg/dL (ref 6–20)
CO2: 28 mmol/L (ref 22–32)
CREATININE: 0.65 mg/dL (ref 0.44–1.00)
Calcium: 9.3 mg/dL (ref 8.9–10.3)
Chloride: 105 mmol/L (ref 101–111)
GFR calc non Af Amer: >60 mL/min (ref >60)
Glucose, Bld: 92 mg/dL (ref 65–99)
Potassium: 4 mmol/L (ref 3.5–5.1)
SODIUM: 139 mmol/L (ref 135–145)

## 2014-12-20 LAB — URINE MICROSCOPIC-ADD ON

## 2014-12-20 MED ORDER — TAMSULOSIN HCL 0.4 MG PO CAPS: 0.4000 mg | ORAL_CAPSULE | Freq: Every day | ORAL | Status: DC

## 2014-12-20 MED ORDER — ONDANSETRON 4 MG PO TBDP: 4.0000 mg | ORAL_TABLET | Freq: Three times a day (TID) | ORAL | Status: DC | PRN

## 2014-12-20 MED ORDER — OXYCODONE-ACETAMINOPHEN 5-325 MG PO TABS: 1.0000 | ORAL_TABLET | ORAL | Status: DC | PRN

## 2014-12-20 MED ORDER — KETOROLAC TROMETHAMINE 30 MG/ML IJ SOLN
30.0000 mg | Freq: Once | INTRAMUSCULAR | Status: AC
  Administered 2014-12-20: 30 mg via INTRAVENOUS
  Filled 2014-12-20: qty 1

## 2014-12-20 NOTE — ED Notes (Signed)
Patient states that she has had blood in her urine x 2 -3 days. Today reports new right side flank pain

## 2014-12-20 NOTE — ED Notes (Signed)
PA at bedside.

## 2014-12-20 NOTE — Discharge Instructions (Signed)
Take the prescribed medication as directed.   Use caution when taking these medications as it can be transmitted in breast milk.   May need to pump and dump a few times before resuming breast feeding. Follow-up with  Urology--  Call to make appointment. Return to the ED for new or worsening symptoms.

## 2014-12-20 NOTE — ED Provider Notes (Signed)
CSN: [id number]     Arrival date & time 12/20/14  1838 History   First MD Initiated Contact with Patient 12/20/14 1929     Chief Complaint  Patient presents with  . Flank Pain     (Consider location/radiation/quality/duration/timing/severity/associated sxs/prior Treatment) Patient is a 29 y.o. female presenting with flank pain. The history is provided by the patient and medical records.  Flank Pain   29 year old female with history of anemia and hypertension presenting to the ED for hematuria. Patient states for the past 2-3 days she has noticed bright red blood present in her urine. She initially thought this was menstrual bleeding, however it is not. She states today she developed some right-sided flank pain, worse with movement. She denies any nausea or vomiting. No fever or chills. No dysuria. No vaginal complaints. No hx of kidney stones, states father had them.  No intervention tried prior to arrival.  VSS.  Past Medical History  Diagnosis Date  . Blood transfusion   . Hemorrhage   . Abnormal Pap smear of cervix     colposcopy  . Anemia   . Hypertension   . Gestational hypertension without significant proteinuria in third trimester    Past Surgical History  Procedure Laterality Date  . Tonsillectomy    . Tonsillectomy     Family History  Problem Relation Age of Onset  . Breast cancer Maternal Aunt   . Breast cancer Paternal Grandmother   . Cervical cancer Maternal Aunt   . Anxiety disorder Mother   . Hypertension Mother   . Stroke Maternal Grandmother    Social History  Substance Use Topics  . Smoking status: Never Smoker   . Smokeless tobacco: Never Used  . Alcohol Use: No   OB History    Gravida Para Term Preterm AB TAB SAB Ectopic Multiple Living   0 3     Review of Systems  Genitourinary: Positive for hematuria and flank pain.  All other systems reviewed and are negative.     Allergies  Review of patient's allergies indicates no known  allergies.  Home Medications   Prior to Admission medications   Medication Sig Start Date End Date Taking? Authorizing Rebekah Smith  ibuprofen (ADVIL,MOTRIN) 600 MG tablet Take 1 tablet (600 mg total) by mouth every 6 (six) hours. 05/02/14   Montez Morita, CNM  norethindrone (ORTHO MICRONOR) 0.35 MG tablet Take 1 tablet (0.35 mg total) by mouth daily. 06/11/14   Dorathy Kinsman, CNM  Prenatal Multivit-Min-Fe-FA (PRENATAL VITAMINS PO) Take 1 tablet by mouth daily.     Historical Rebekah Stovall, MD   BP 120/75 mmHg  Pulse 65  Temp(Src) 97.8 F (36.6 C) (Oral)  Resp 18  Ht  (1.702 m)  Wt 145 lb 2 oz (65.828 kg)  BMI 22.72 kg/m2  SpO2 100%  Breastfeeding? No   Physical Exam  Constitutional: She is oriented to person, place, and time. She appears well-developed and well-nourished. No distress.  HENT:  Head: Normocephalic and atraumatic.  Mouth/Throat: Oropharynx is clear and moist.  Eyes: Conjunctivae and EOM are normal. Pupils are equal, round, and reactive to light.  Neck: Normal range of motion. Neck supple.  Cardiovascular: Normal rate, regular rhythm and normal heart sounds.   Pulmonary/Chest: Effort normal and breath sounds normal. No respiratory distress. She has no wheezes.  Abdominal: Soft. Bowel sounds are normal. There is no tenderness. There is CVA tenderness (right). There is no guarding.  Musculoskeletal: Normal  range of motion.  Neurological: She is alert and oriented to person, place, and time.  Skin: Skin is warm. She is not diaphoretic.  Psychiatric: She has a normal mood and affect.  Nursing note and vitals reviewed.   ED Course  Procedures (including critical care time) Labs Review Labs Reviewed  URINALYSIS, ROUTINE W REFLEX MICROSCOPIC (NOT AT [facility]) - Abnormal; Notable for the following:    APPearance CLOUDY (*)    Hgb urine dipstick LARGE (*)    Protein, ur 100 (*)    All other components within normal limits  PREGNANCY, URINE  URINE MICROSCOPIC-ADD ON  CBC  WITH DIFFERENTIAL/PLATELET  BASIC METABOLIC PANEL    Imaging Review Ct Renal Stone Study  12/20/2014   CLINICAL DATA:  Right flank pain for 2 days.  Hematuria.  EXAM: CT ABDOMEN AND PELVIS WITHOUT CONTRAST  TECHNIQUE: Multidetector CT imaging of the abdomen and pelvis was performed following the standard protocol without IV contrast.  COMPARISON:  02/19/2013  FINDINGS: Lower chest: No pulmonary nodules, pleural effusions, or infiltrates. Heart size is normal. No imaged pericardial effusion or significant coronary artery calcifications.  Upper abdomen: There is mild right-sided hydronephrosis. A 7 mm calculus is identified within the proximal right ureter at the level of L3. No intrarenal stones are identified. No focal abnormality identified within the liver, spleen, pancreas, or adrenal glands. Gallbladder is present. The left kidney and ureter have a normal appearance.  Gastrointestinal tract: The stomach and small bowel loops are normal in appearance. The appendix is well seen and has a normal appearance. New colonic loops are normal in appearance.  Pelvis: The uterus is present. No adnexal mass. No free pelvic fluid.  Retroperitoneum: No evidence for aortic aneurysm. Small mesenteric lymph nodes are identified, nonspecific in appearance.  Abdominal wall: Unremarkable.  Osseous structures: Unremarkable.  IMPRESSION: 1. Right proximal ureteral stone measuring 6 mm at the level of L4. 2. No intrarenal stones.  Left kidney and ureter are normal. 3. Normal appendix.   Electronically Signed   By: Norva Pavlov M.D.   On: 12/20/2014 21:05   I have personally reviewed and evaluated these images and lab results as part of my medical decision-making.   EKG Interpretation None      MDM   Final diagnoses:  Right flank pain  Hematuria  Right ureteral stone    29 year old female who with right flank pain and hematuria. Patient is afebrile, nontoxic. Right CVA tenderness noted on exam. Remainder of  abdominal exam is benign. labwork was obtained, UA with large blood but appears noninfectious.   CT renal study was obtained revealing right proximal ureteral stone measuring 6 mm with mild right-sided hydronephrosis. Renal function is preserved. Patient was treated with Toradol with improvement of her pain. Will discharge home with urine strainer, started to strain urine to monitor for passage of stone. Rx Percocet, Zofran, and Flomax. I've advised patient caution regarding these medications as she is intermittently breast-feeding and medications may be transmitted in breast milk, she acknowledged understanding.  FU with urology.  Discussed plan with patient, he/she acknowledged understanding and agreed with plan of care.  Return precautions given for new or worsening symptoms.  Garlon Hatchet, PA-C 12/20/14 8119  Rolan Bucco, MD 12/20/14 [phone]

## 2015-12-26 ENCOUNTER — Encounter (HOSPITAL_COMMUNITY): Payer: Self-pay | Admitting: *Deleted

## 2015-12-26 ENCOUNTER — Inpatient Hospital Stay (HOSPITAL_COMMUNITY)
Admission: AD | Admit: 2015-12-26 | Discharge: 2015-12-26 | Disposition: A | Discharge status: Home / Self Care | Payer: Medicaid Other | Source: Ambulatory Visit | Attending: Family Medicine | Admitting: Family Medicine

## 2015-12-26 ENCOUNTER — Inpatient Hospital Stay (HOSPITAL_COMMUNITY): Payer: Medicaid Other

## 2015-12-26 DIAGNOSIS — R102 Pelvic and perineal pain: Secondary | ICD-10-CM | POA: Diagnosis present

## 2015-12-26 DIAGNOSIS — O26891 Other specified pregnancy related conditions, first trimester: Secondary | ICD-10-CM | POA: Insufficient documentation

## 2015-12-26 DIAGNOSIS — N949 Unspecified condition associated with female genital organs and menstrual cycle: Secondary | ICD-10-CM

## 2015-12-26 DIAGNOSIS — Z3A01 Less than 8 weeks gestation of pregnancy: Secondary | ICD-10-CM | POA: Insufficient documentation

## 2015-12-26 LAB — WET PREP, GENITAL
Clue Cells Wet Prep HPF POC: NONE SEEN
SPERM: NONE SEEN
Trich, Wet Prep: NONE SEEN
YEAST WET PREP: NONE SEEN

## 2015-12-26 LAB — URINALYSIS, ROUTINE W REFLEX MICROSCOPIC
Bilirubin Urine: NEGATIVE
Glucose, UA: NEGATIVE mg/dL
Ketones, ur: NEGATIVE mg/dL
LEUKOCYTES UA: NEGATIVE
NITRITE: NEGATIVE
Protein, ur: NEGATIVE mg/dL
Specific Gravity, Urine: >1.03 — ABNORMAL HIGH (ref 1.005–1.030)
pH: 6 (ref 5.0–8.0)

## 2015-12-26 LAB — URINE MICROSCOPIC-ADD ON

## 2015-12-26 LAB — CBC
HCT: 35.7 % — ABNORMAL LOW (ref 36.0–46.0)
Hemoglobin: 12.7 g/dL (ref 12.0–15.0)
MCH: 29.5 pg (ref 26.0–34.0)
MCHC: 35.6 g/dL (ref 30.0–36.0)
MCV: 83 fL (ref 78.0–100.0)
Platelets: 229 10*3/uL (ref 150–400)
RBC: 4.3 MIL/uL (ref 3.87–5.11)
RDW: 13.2 % (ref 11.5–15.5)
WBC: 8 10*3/uL (ref 4.0–10.5)

## 2015-12-26 LAB — HCG, QUANTITATIVE, PREGNANCY: HCG, BETA CHAIN, QUANT, S: 16569 m[IU]/mL — AB (ref <5)

## 2015-12-26 NOTE — MAU Note (Signed)
Pt reports really bad cramping off/on for 2 days, found out she was pregnant [redacted] weeks ago and was diagnosed with a UTI. Took all of her antibiotics. Denies bleeding.

## 2015-12-26 NOTE — MAU Provider Note (Signed)
History     CSN: [id number]  Arrival date and time: 12/26/15 2120   First Provider Initiated Contact with Patient 12/26/15 2237      Chief Complaint  Patient presents with  . Pelvic Pain   Pelvic Pain  The patient's primary symptoms include pelvic pain. This is a new problem. The current episode started in the past 7 days. The problem occurs intermittently. The problem has been unchanged. Pain severity now: 6/10  The problem affects the right side. She is pregnant. Associated symptoms include abdominal pain and constipation. Pertinent negatives include no chills, diarrhea, dysuria, fever, frequency, nausea, urgency or vomiting. The vaginal discharge was normal. There has been no bleeding. Nothing aggravates the symptoms. She has tried acetaminophen for the symptoms. The treatment provided moderate relief. Her menstrual history has been irregular (LMP 11/12/15 ).    Past Medical History:  Diagnosis Date  . Abnormal Pap smear of cervix    colposcopy  . Anemia   . Blood transfusion   . Gestational hypertension without significant proteinuria in third trimester   . Hemorrhage   . Hypertension     Past Surgical History:  Procedure Laterality Date  . TONSILLECTOMY    . TONSILLECTOMY      Family History  Problem Relation Age of Onset  . Anxiety disorder Mother   . Hypertension Mother   . Breast cancer Maternal Aunt   . Breast cancer Paternal Grandmother   . Cervical cancer Maternal Aunt   . Stroke Maternal Grandmother     Social History  Substance Use Topics  . Smoking status: Never Smoker  . Smokeless tobacco: Never Used  . Alcohol use No    Allergies: No Known Allergies  Prescriptions Prior to Admission  Medication Sig Dispense Refill Last Dose  . ibuprofen (ADVIL,MOTRIN) 600 MG tablet Take 1 tablet (600 mg total) by mouth every 6 (six) hours. 30 tablet 0 Taking  . norethindrone (ORTHO MICRONOR) 0.35 MG tablet Take 1 tablet (0.35 mg total) by mouth daily. 1 Package  12   . ondansetron (ZOFRAN ODT) 4 MG disintegrating tablet Take 1 tablet (4 mg total) by mouth every 8 (eight) hours as needed for nausea. 10 tablet 0   . oxyCODONE-acetaminophen (PERCOCET/ROXICET) 5-325 MG per tablet Take 1 tablet by mouth every 4 (four) hours as needed. 20 tablet 0   . Prenatal Multivit-Min-Fe-FA (PRENATAL VITAMINS PO) Take 1 tablet by mouth daily.    Taking  . tamsulosin (FLOMAX) 0.4 MG CAPS capsule Take 1 capsule (0.4 mg total) by mouth daily after supper. 7 capsule 0     Review of Systems  Constitutional: Negative for chills and fever.  Gastrointestinal: Positive for abdominal pain and constipation. Negative for diarrhea, nausea and vomiting.  Genitourinary: Positive for pelvic pain. Negative for dysuria, frequency and urgency.   Physical Exam   Blood pressure 127/83, pulse 72, temperature 98.2 F (36.8 C), temperature source Oral, resp. rate 18, height 5\' 7"  (1.702 m), weight 158 lb (71.7 kg), last menstrual period 11/12/2015, SpO2 99 %, not currently breastfeeding.  Physical Exam  Nursing note and vitals reviewed. Constitutional: She is oriented to person, place, and time. She appears well-developed and well-nourished. No distress.  HENT:  Head: Normocephalic.  Cardiovascular: Normal rate.   Respiratory: Effort normal.  GI: Soft. There is no tenderness. There is no rebound.  Musculoskeletal: Normal range of motion.  Neurological: She is alert and oriented to person, place, and time.  Skin: Skin is warm.  Psychiatric: She has  a normal mood and affect.   Results for orders placed or performed during the hospital encounter of 12/26/15 (from the past 24 hour(s))  Urinalysis, Routine w reflex microscopic (not at [facility])     Status: Abnormal   Collection Time: 12/26/15  9:55 PM  Result Value Ref Range   Color, Urine YELLOW YELLOW   APPearance CLEAR CLEAR   Specific Gravity, Urine >1.030 (H) 1.005 - 1.030   pH 6.0 5.0 - 8.0   Glucose, UA NEGATIVE NEGATIVE mg/dL    Hgb urine dipstick TRACE (A) NEGATIVE   Bilirubin Urine NEGATIVE NEGATIVE   Ketones, ur NEGATIVE NEGATIVE mg/dL   Protein, ur NEGATIVE NEGATIVE mg/dL   Nitrite NEGATIVE NEGATIVE   Leukocytes, UA NEGATIVE NEGATIVE  Urine microscopic-add on     Status: Abnormal   Collection Time: 12/26/15  9:55 PM  Result Value Ref Range   Squamous Epithelial / LPF 0-5 (A) NONE SEEN   WBC, UA 0-5 0 - 5 WBC/hpf   RBC / HPF 0-5 0 - 5 RBC/hpf   Bacteria, UA FEW (A) NONE SEEN  Wet prep, genital     Status: Abnormal   Collection Time: 12/26/15 10:20 PM  Result Value Ref Range   Yeast Wet Prep HPF POC NONE SEEN NONE SEEN   Trich, Wet Prep NONE SEEN NONE SEEN   Clue Cells Wet Prep HPF POC NONE SEEN NONE SEEN   WBC, Wet Prep HPF POC FEW (A) NONE SEEN   Sperm NONE SEEN   CBC     Status: Abnormal   Collection Time: 12/26/15 10:31 PM  Result Value Ref Range   WBC 8.0 4.0 - 10.5 K/uL   RBC 4.30 3.87 - 5.11 MIL/uL   Hemoglobin 12.7 12.0 - 15.0 g/dL   HCT 16.1 (L) 09.6 - 04.5 %   MCV 83.0 78.0 - 100.0 fL   MCH 29.5 26.0 - 34.0 pg   MCHC 35.6 30.0 - 36.0 g/dL   RDW 40.9 81.1 - 91.4 %   Platelets 229 150 - 400 K/uL   US= SIUP with yolk sac, no fetal pole yet visualized.   MAU Course  Procedures  MDM   Assessment and Plan   1. Pelvic pain in pregnancy, antepartum, first trimester    DC home Comfort measures reviewed  1stTrimester precautions  RX: none  Return to MAU as needed FU with OB as planned  Follow-up Information    [facility] .   Contact information: [address] [place] [address] [phone]           Tawnya Crook 12/26/2015, 10:38 PM

## 2015-12-26 NOTE — Discharge Instructions (Signed)
First Trimester of Pregnancy The first trimester of pregnancy is from week 1 until the end of week 12 (months 1 through 3). A week after a sperm fertilizes an egg, the egg will implant on the wall of the uterus. This embryo will begin to develop into a baby. Genes from you and your partner are forming the baby. The female genes determine whether the baby is a boy or a girl. At 6-8 weeks, the eyes and face are formed, and the heartbeat can be seen on ultrasound. At the end of 12 weeks, all the baby's organs are formed.  Now that you are pregnant, you will want to do everything you can to have a healthy baby. Two of the most important things are to get good prenatal care and to follow your health care provider's instructions. Prenatal care is all the medical care you receive before the baby's birth. This care will help prevent, find, and treat any problems during the pregnancy and childbirth. BODY CHANGES Your body goes through many changes during pregnancy. The changes vary from woman to woman.   You may gain or lose a couple of pounds at first.  You may feel sick to your stomach (nauseous) and throw up (vomit). If the vomiting is uncontrollable, call your health care provider.  You may tire easily.  You may develop headaches that can be relieved by medicines approved by your health care provider.  You may urinate more often. Painful urination may mean you have a bladder infection.  You may develop heartburn as a result of your pregnancy.  You may develop constipation because certain hormones are causing the muscles that push waste through your intestines to slow down.  You may develop hemorrhoids or swollen, bulging veins (varicose veins).  Your breasts may begin to grow larger and become tender. Your nipples may stick out more, and the tissue that surrounds them (areola) may become darker.  Your gums may bleed and may be sensitive to brushing and flossing.  Dark spots or blotches (chloasma,  mask of pregnancy) may develop on your face. This will likely fade after the baby is born.  Your menstrual periods will stop.  You may have a loss of appetite.  You may develop cravings for certain kinds of food.  You may have changes in your emotions from day to day, such as being excited to be pregnant or being concerned that something may go wrong with the pregnancy and baby.  You may have more vivid and strange dreams.  You may have changes in your hair. These can include thickening of your hair, rapid growth, and changes in texture. Some women also have hair loss during or after pregnancy, or hair that feels dry or thin. Your hair will most likely return to normal after your baby is born. WHAT TO EXPECT AT YOUR PRENATAL VISITS During a routine prenatal visit:  You will be weighed to make sure you and the baby are growing normally.  Your blood pressure will be taken.  Your abdomen will be measured to track your baby's growth.  The fetal heartbeat will be listened to starting around week 10 or 12 of your pregnancy.  Test results from any previous visits will be discussed. Your health care provider may ask you:  How you are feeling.  If you are feeling the baby move.  If you have had any abnormal symptoms, such as leaking fluid, bleeding, severe headaches, or abdominal cramping.  If you are using any tobacco products,   including cigarettes, chewing tobacco, and electronic cigarettes.  If you have any questions. Other tests that may be performed during your first trimester include:  Blood tests to find your blood type and to check for the presence of any previous infections. They will also be used to check for low iron levels (anemia) and Rh antibodies. Later in the pregnancy, blood tests for diabetes will be done along with other tests if problems develop.  Urine tests to check for infections, diabetes, or protein in the urine.  An ultrasound to confirm the proper growth  and development of the baby.  An amniocentesis to check for possible genetic problems.  Fetal screens for spina bifida and Down syndrome.  You may need other tests to make sure you and the baby are doing well.  HIV (human immunodeficiency virus) testing. Routine prenatal testing includes screening for HIV, unless you choose not to have this test. HOME CARE INSTRUCTIONS  Medicines  Follow your health care provider's instructions regarding medicine use. Specific medicines may be either safe or unsafe to take during pregnancy.  Take your prenatal vitamins as directed.  If you develop constipation, try taking a stool softener if your health care provider approves. Diet  Eat regular, well-balanced meals. Choose a variety of foods, such as meat or vegetable-based protein, fish, milk and low-fat dairy products, vegetables, fruits, and whole grain breads and cereals. Your health care provider will help you determine the amount of weight gain that is right for you.  Avoid raw meat and uncooked cheese. These carry germs that can cause birth defects in the baby.  Eating four or five small meals rather than three large meals a day may help relieve nausea and vomiting. If you start to feel nauseous, eating a few soda crackers can be helpful. Drinking liquids between meals instead of during meals also seems to help nausea and vomiting.  If you develop constipation, eat more high-fiber foods, such as fresh vegetables or fruit and whole grains. Drink enough fluids to keep your urine clear or pale yellow. Activity and Exercise  Exercise only as directed by your health care provider. Exercising will help you:  Control your weight.  Stay in shape.  Be prepared for labor and delivery.  Experiencing pain or cramping in the lower abdomen or low back is a good sign that you should stop exercising. Check with your health care provider before continuing normal exercises.  Try to avoid standing for long  periods of time. Move your legs often if you must stand in one place for a long time.  Avoid heavy lifting.  Wear low-heeled shoes, and practice good posture.  You may continue to have sex unless your health care provider directs you otherwise. Relief of Pain or Discomfort  Wear a good support bra for breast tenderness.   Take warm sitz baths to soothe any pain or discomfort caused by hemorrhoids. Use hemorrhoid cream if your health care provider approves.   Rest with your legs elevated if you have leg cramps or low back pain.  If you develop varicose veins in your legs, wear support hose. Elevate your feet for 15 minutes, 3-4 times a day. Limit salt in your diet. Prenatal Care  Schedule your prenatal visits by the twelfth week of pregnancy. They are usually scheduled monthly at first, then more often in the last 2 months before delivery.  Write down your questions. Take them to your prenatal visits.  Keep all your prenatal visits as directed by your   health care provider. Safety  Wear your seat belt at all times when driving.  Make a list of emergency phone numbers, including numbers for family, friends, the hospital, and police and fire departments. General Tips  Ask your health care provider for a referral to a local prenatal education class. Begin classes no later than at the beginning of month 6 of your pregnancy.  Ask for help if you have counseling or nutritional needs during pregnancy. Your health care provider can offer advice or refer you to specialists for help with various needs.  Do not use hot tubs, steam rooms, or saunas.  Do not douche or use tampons or scented sanitary pads.  Do not cross your legs for long periods of time.  Avoid cat litter boxes and soil used by cats. These carry germs that can cause birth defects in the baby and possibly loss of the fetus by miscarriage or stillbirth.  Avoid all smoking, herbs, alcohol, and medicines not prescribed by  your health care provider. Chemicals in these affect the formation and growth of the baby.  Do not use any tobacco products, including cigarettes, chewing tobacco, and electronic cigarettes. If you need help quitting, ask your health care provider. You may receive counseling support and other resources to help you quit.  Schedule a dentist appointment. At home, brush your teeth with a soft toothbrush and be gentle when you floss. SEEK MEDICAL CARE IF:   You have dizziness.  You have mild pelvic cramps, pelvic pressure, or nagging pain in the abdominal area.  You have persistent nausea, vomiting, or diarrhea.  You have a bad smelling vaginal discharge.  You have pain with urination.  You notice increased swelling in your face, hands, legs, or ankles. SEEK IMMEDIATE MEDICAL CARE IF:   You have a fever.  You are leaking fluid from your vagina.  You have spotting or bleeding from your vagina.  You have severe abdominal cramping or pain.  You have rapid weight gain or loss.  You vomit blood or material that looks like coffee grounds.  You are exposed to German measles and have never had them.  You are exposed to fifth disease or chickenpox.  You develop a severe headache.  You have shortness of breath.  You have any kind of trauma, such as from a fall or a car accident.   This information is not intended to replace advice given to you by your health care provider. Make sure you discuss any questions you have with your health care provider.   Document Released: 03/13/2001 Document Revised: 04/09/2014 Document Reviewed: 01/27/2013 Elsevier Interactive Patient Education 2016 [employer]  

## 2015-12-27 LAB — HIV ANTIBODY (ROUTINE TESTING W REFLEX): HIV Screen 4th Generation wRfx: NONREACTIVE

## 2015-12-27 LAB — RPR: RPR Ser Ql: NONREACTIVE

## 2015-12-27 LAB — GC/CHLAMYDIA PROBE AMP (~~LOC~~) NOT AT ARMC
Chlamydia: NEGATIVE
NEISSERIA GONORRHEA: NEGATIVE

## 2016-01-10 ENCOUNTER — Other Ambulatory Visit (INDEPENDENT_AMBULATORY_CARE_PROVIDER_SITE_OTHER): Payer: Medicaid Other

## 2016-01-10 VITALS — BP 114/75 | HR 74 | Resp 16 | Ht 67.0 in | Wt 159.0 lb

## 2016-01-10 DIAGNOSIS — O9989 Other specified diseases and conditions complicating pregnancy, childbirth and the puerperium: Secondary | ICD-10-CM

## 2016-01-10 DIAGNOSIS — N898 Other specified noninflammatory disorders of vagina: Secondary | ICD-10-CM

## 2016-01-10 DIAGNOSIS — Z3A08 8 weeks gestation of pregnancy: Secondary | ICD-10-CM

## 2016-01-10 DIAGNOSIS — N760 Acute vaginitis: Secondary | ICD-10-CM

## 2016-01-10 DIAGNOSIS — B9689 Other specified bacterial agents as the cause of diseases classified elsewhere: Secondary | ICD-10-CM

## 2016-01-10 NOTE — Progress Notes (Signed)
Pt is here with c/o's yellow vaginal discharge.  Wet prep was obatained.  She is also 8 w pregnant per bedside US.  She is scheduled for a NOB appt on Monday with Dr Penne LashLeggett.  Prenatal labs drawn today.  Pt stated she recently was treated for a UTI and urine culture was sent for TOC.  Babyscript info was given to pt but not sure if she is a candidate due to previous pre-eclampsia.  CRL today is 16.378mm and FHT is 153 BPM.

## 2016-01-11 ENCOUNTER — Telehealth: Payer: Self-pay

## 2016-01-11 LAB — ANTIBODY TITER (PRENATAL TITER): Ab Titer: 4

## 2016-01-11 LAB — OBSTETRIC PANEL
Antibody Screen: POSITIVE — AB
BASOS ABS: 0 {cells}/uL (ref 0–200)
Basophils Relative: 0 %
EOS PCT: 1 %
Eosinophils Absolute: 77 cells/uL (ref 15–500)
HEMATOCRIT: 40 % (ref 35.0–45.0)
HEMOGLOBIN: 13.3 g/dL (ref 11.7–15.5)
HEP B S AG: NEGATIVE
LYMPHS PCT: 18 %
Lymphs Abs: 1386 cells/uL (ref 850–3900)
MCH: 29.2 pg (ref 27.0–33.0)
MCHC: 33.3 g/dL (ref 32.0–36.0)
MCV: 87.7 fL (ref 80.0–100.0)
MONOS PCT: 8 %
MPV: 10.5 fL (ref 7.5–12.5)
Monocytes Absolute: 616 cells/uL (ref 200–950)
NEUTROS PCT: 73 %
Neutro Abs: 5621 cells/uL (ref 1500–7800)
Platelets: 248 10*3/uL (ref 140–400)
RBC: 4.56 MIL/uL (ref 3.80–5.10)
RDW: 14 % (ref 11.0–15.0)
RH TYPE: POSITIVE
Rubella: 1.52 Index — ABNORMAL HIGH (ref <0.90)
WBC: 7.7 10*3/uL (ref 3.8–10.8)

## 2016-01-11 LAB — WET PREP FOR TRICH, YEAST, CLUE
Trich, Wet Prep: NONE SEEN
YEAST WET PREP: NONE SEEN

## 2016-01-11 LAB — PROTEIN / CREATININE RATIO, URINE
Creatinine, Urine: 207 mg/dL (ref 20–320)
Protein Creatinine Ratio: 126 mg/g creat (ref 21–161)
Total Protein, Urine: 26 mg/dL — ABNORMAL HIGH (ref 5–24)

## 2016-01-11 LAB — COMPREHENSIVE METABOLIC PANEL
ALK PHOS: 42 U/L (ref 33–115)
ALT: 19 U/L (ref 6–29)
AST: 30 U/L (ref 10–30)
Albumin: 4.3 g/dL (ref 3.6–5.1)
BUN: 9 mg/dL (ref 7–25)
CO2: 23 mmol/L (ref 20–31)
Calcium: 8.8 mg/dL (ref 8.6–10.2)
Chloride: 103 mmol/L (ref 98–110)
Creat: 0.61 mg/dL (ref 0.50–1.10)
Glucose, Bld: 85 mg/dL (ref 65–99)
POTASSIUM: 4.4 mmol/L (ref 3.5–5.3)
Sodium: 135 mmol/L (ref 135–146)
Total Bilirubin: 0.6 mg/dL (ref 0.2–1.2)
Total Protein: 6.5 g/dL (ref 6.1–8.1)

## 2016-01-11 LAB — URINE CULTURE: ORGANISM ID, BACTERIA: NO GROWTH

## 2016-01-11 LAB — GC/CHLAMYDIA PROBE AMP
CT Probe RNA: NOT DETECTED
GC PROBE AMP APTIMA: NOT DETECTED

## 2016-01-11 LAB — BLOOD TYPING, RBC ANTIGENS: ANTIGEN TYPING: NEGATIVE

## 2016-01-11 LAB — PRENATAL ANTIBODY IDENTIFICATION

## 2016-01-11 MED ORDER — METRONIDAZOLE 500 MG PO TABS: 500.0000 mg | ORAL_TABLET | Freq: Two times a day (BID) | ORAL | 0 refills | Status: DC

## 2016-01-11 NOTE — Telephone Encounter (Signed)
I spoke with pt and she is aware that she has bacterial vaginosis and that flagyl has been sent to her pharmacy

## 2016-01-11 NOTE — Addendum Note (Signed)
Addended by: Kathie DikeSOLA, DEANNA J on: 01/11/2016 01:11 PM   Modules accepted: Orders

## 2016-01-16 ENCOUNTER — Ambulatory Visit (INDEPENDENT_AMBULATORY_CARE_PROVIDER_SITE_OTHER): Payer: Medicaid Other | Admitting: Obstetrics & Gynecology

## 2016-01-16 ENCOUNTER — Encounter: Payer: Self-pay | Admitting: Obstetrics & Gynecology

## 2016-01-16 ENCOUNTER — Encounter: Payer: Self-pay | Admitting: Advanced Practice Midwife

## 2016-01-16 VITALS — BP 125/89 | HR 71 | Wt 160.0 lb

## 2016-01-16 DIAGNOSIS — O361990 Maternal care for other isoimmunization, unspecified trimester, not applicable or unspecified: Secondary | ICD-10-CM

## 2016-01-16 DIAGNOSIS — O09299 Supervision of pregnancy with other poor reproductive or obstetric history, unspecified trimester: Secondary | ICD-10-CM

## 2016-01-16 DIAGNOSIS — O0990 Supervision of high risk pregnancy, unspecified, unspecified trimester: Secondary | ICD-10-CM | POA: Insufficient documentation

## 2016-01-16 DIAGNOSIS — Z3481 Encounter for supervision of other normal pregnancy, first trimester: Secondary | ICD-10-CM | POA: Diagnosis not present

## 2016-01-16 DIAGNOSIS — O361910 Maternal care for other isoimmunization, first trimester, not applicable or unspecified: Secondary | ICD-10-CM | POA: Diagnosis not present

## 2016-01-16 HISTORY — DX: Maternal care for other isoimmunization, unspecified trimester, not applicable or unspecified: O36.1990

## 2016-01-16 MED ORDER — ASPIRIN EC 81 MG PO TBEC: 81.0000 mg | DELAYED_RELEASE_TABLET | Freq: Every day | ORAL | 2 refills | Status: DC

## 2016-01-16 NOTE — Progress Notes (Signed)
Subjective:    Rebekah Smith is a 29 y.o.G5P2113 at [redacted]w[redacted]d, by LMP c/w 6.2 week scan, being seen today for her first obstetrical visit.  Her obstetrical history is significant for pre-eclampsia in past two pregnancies and postpartum hemorrhage in 2012 delivery requiring transfusion. 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 Had in two last pregnancies, denies chronic hypertension as she is normotensive when not pregnant. Patient does intend to breast feed. Pregnancy history fully reviewed.  Patient reports no complaints.  Vitals:   01/16/16 1529 01/16/16 1531  BP:  125/89  Pulse:  71  Weight: 160 lb (72.6 kg)     HISTORY: OB History  Gravida Para Term Preterm AB Living  5 3 2 1 1 3  SAB TAB Ectopic Multiple Live Births  1     0 3    # Outcome Date GA Lbr Len/2nd Weight Sex Delivery Anes PTL Lv  5 Current           4 Preterm 04/30/14 [redacted]w[redacted]d / 00:14 6 lb 2.1 oz (2.781 kg) M Vag-Spont EPI  LIV     Birth Comments: within normal limits  3 SAB 06/09/13          2 Term 09/01/10 [redacted]w[redacted]d  9 lb 9 oz (4.338 kg) M Vag-Spont EPI N LIV     Birth Comments: PPH  1 Term 12/03/07 [redacted]w[redacted]d  8 lb 9 oz (3.884 kg) M Vag-Spont EPI N LIV     Past Medical History:  Diagnosis Date  . Abnormal Pap smear of cervix    colposcopy  . Anemia   . Blood transfusion   . Hemorrhage   . History of preeclampsia    Had in two last pregnancies, denies chronic hypertension as she is normotensive when not pregnant   Past Surgical History:  Procedure Laterality Date  . TONSILLECTOMY    . TONSILLECTOMY     Family History  Problem Relation Age of Onset  . Anxiety disorder Mother   . Hypertension Mother   . Breast cancer Maternal Aunt   . Breast cancer Paternal Grandmother   . Cervical cancer Maternal Aunt   . Stroke Maternal Grandmother      Exam    Uterus:     Pelvic Exam: Deferred  System: Breast:  normal appearance, no masses or tenderness   Skin: normal  coloration and turgor, no rashes   Neurologic: oriented, normal, negative   Extremities: normal strength, tone, and muscle mass, no deformities   HEENT PERRLA and extra ocular movement intact   Mouth/Teeth mucous membranes moist, pharynx normal without lesions and dental hygiene good   Neck supple and no masses   Cardiovascular: regular rate and rhythm   Respiratory:  appears well, vitals normal, no respiratory distress, acyanotic, normal RR, chest clear, no wheezing, crepitations, rhonchi, normal symmetric air entry   Abdomen: soft, non-tender; bowel sounds normal; no masses,  no organomegaly   Urinary: urethral meatus normal    Results for orders placed or performed in visit on 01/10/16 (from the past 336 hour(s))  GC/Chlamydia Probe Amp   Collection Time: 01/10/16 10:18 AM  Result Value Ref Range   CT Probe RNA NOT DETECTED    GC Probe RNA NOT DETECTED   Urine Culture   Collection Time: 01/10/16 10:18 AM  Result Value Ref Range   Organism ID, Bacteria NO GROWTH   Prenatal (OB Panel)   Collection Time: 01/10/16 10:18 AM  Result Value Ref Range     WBC 7.7 3.8 - 10.8 K/uL   RBC 4.56 3.80 - 5.10 MIL/uL   Hemoglobin 13.3 11.7 - 15.5 g/dL   HCT 40.0 35.0 - 45.0 %   MCV 87.7 80.0 - 100.0 fL   MCH 29.2 27.0 - 33.0 pg   MCHC 33.3 32.0 - 36.0 g/dL   RDW 14.0 11.0 - 15.0 %   Platelets 248 140 - 400 K/uL   MPV 10.5 7.5 - 12.5 fL   Neutro Abs 5,621 1,500 - 7,800 cells/uL   Lymphs Abs 1,386 850 - 3,900 cells/uL   Monocytes Absolute 616 200 - 950 cells/uL   Eosinophils Absolute 77 15 - 500 cells/uL   Basophils Absolute 0 0 - 200 cells/uL   Neutrophils Relative % 73 %   Lymphocytes Relative 18 %   Monocytes Relative 8 %   Eosinophils Relative 1 %   Basophils Relative 0 %   Smear Review Criteria for review not met    Hepatitis B Surface Ag NEGATIVE NEGATIVE   RPR Ser Ql NON REAC NON REAC   Rubella 1.52 (H) <0.90 Index   ABO Grouping O    Rh Type POS    Antibody Screen POS (A)  NEGATIVE  Prenatal Antibody Identification   Collection Time: 01/10/16 10:18 AM  Result Value Ref Range   Antibody Id. #1 ANTI KELL   Antibody titer (prenatal titer)   Collection Time: 01/10/16 10:18 AM  Result Value Ref Range   Antibody Identified ANTI KELL    Ab Titer 4   Blood typing, RBC antigens   Collection Time: 01/10/16 10:18 AM  Result Value Ref Range   Antigen Typing K NEG   WET PREP FOR TRICH, YEAST, CLUE   Collection Time: 01/10/16 10:19 AM  Result Value Ref Range   Yeast Wet Prep HPF POC NONE SEEN NONE SEEN   Trich, Wet Prep NONE SEEN NONE SEEN   Clue Cells Wet Prep HPF POC MANY (A) NONE SEEN   WBC, Wet Prep HPF POC TNTC (A) NONE SEEN  Comp Met (CMET)   Collection Time: 01/10/16 10:32 AM  Result Value Ref Range   Sodium 135 135 - 146 mmol/L   Potassium 4.4 3.5 - 5.3 mmol/L   Chloride 103 98 - 110 mmol/L   CO2 23 20 - 31 mmol/L   Glucose, Bld 85 65 - 99 mg/dL   BUN 9 7 - 25 mg/dL   Creat 0.61 0.50 - 1.10 mg/dL   Total Bilirubin 0.6 0.2 - 1.2 mg/dL   Alkaline Phosphatase 42 33 - 115 U/L   AST 30 10 - 30 U/L   ALT 19 6 - 29 U/L   Total Protein 6.5 6.1 - 8.1 g/dL   Albumin 4.3 3.6 - 5.1 g/dL   Calcium 8.8 8.6 - 10.2 mg/dL  Protein / Creatinine Ratio, Urine   Collection Time: 01/10/16 10:32 AM  Result Value Ref Range   Creatinine, Urine 207 20 - 320 mg/dL   Total Protein, Urine 26 (H) 5 - 24 mg/dL   Protein Creatinine Ratio 126 21 - 161 mg/g creat      Assessment:    Pregnancy: G5P2113 Patient Active Problem List   Diagnosis Date Noted  . Supervision of normal pregnancy 01/16/2016  . History of postpartum hemorrhage 11/16/2013  . History of preeclampsia in prior pregnancy, currently pregnant 11/16/2013      Plan:   Initial labs reviewed, continue prenatal vitamins. Had normal pap in 2016. Genetic Screening discussed First Screen: ordered. AFP   only in second trimester.  Also ordered MFM referral/consult given positive Anti-Kell antibody titer;  result reviewed with patient and expressed need for surveillance. Will follow up recommendations. Ultrasound discussed; fetal survey will be ordered later. Aspirin recommended to start after 12 weeks for prevention of preeclampsia. Problem list reviewed and updated. The nature of [facility] with multiple MDs and other Advanced Practice Providers was explained to patient; also emphasized that residents, students are part of our team. No other complaints or concerns.  Routine obstetric precautions reviewed. Follow up in 4 weeks.   Osborne Oman, MD 01/16/2016

## 2016-01-16 NOTE — Patient Instructions (Addendum)
Return to clinic for any scheduled appointments or obstetric concerns, or go to MAU for evaluation  Anti-Kell antibody

## 2016-01-17 ENCOUNTER — Encounter: Payer: Self-pay | Admitting: *Deleted

## 2016-01-31 ENCOUNTER — Encounter (HOSPITAL_COMMUNITY): Payer: Self-pay | Admitting: Obstetrics & Gynecology

## 2016-02-07 ENCOUNTER — Ambulatory Visit (INDEPENDENT_AMBULATORY_CARE_PROVIDER_SITE_OTHER): Payer: Medicaid Other | Admitting: Obstetrics & Gynecology

## 2016-02-07 VITALS — BP 131/90 | HR 74 | Wt 160.0 lb

## 2016-02-07 DIAGNOSIS — O361910 Maternal care for other isoimmunization, first trimester, not applicable or unspecified: Secondary | ICD-10-CM

## 2016-02-07 DIAGNOSIS — O09291 Supervision of pregnancy with other poor reproductive or obstetric history, first trimester: Secondary | ICD-10-CM

## 2016-02-07 DIAGNOSIS — Z862 Personal history of diseases of the blood and blood-forming organs and certain disorders involving the immune mechanism: Secondary | ICD-10-CM

## 2016-02-07 DIAGNOSIS — Z8759 Personal history of other complications of pregnancy, childbirth and the puerperium: Secondary | ICD-10-CM

## 2016-02-07 DIAGNOSIS — N898 Other specified noninflammatory disorders of vagina: Secondary | ICD-10-CM

## 2016-02-07 DIAGNOSIS — O9989 Other specified diseases and conditions complicating pregnancy, childbirth and the puerperium: Secondary | ICD-10-CM

## 2016-02-07 DIAGNOSIS — O09299 Supervision of pregnancy with other poor reproductive or obstetric history, unspecified trimester: Secondary | ICD-10-CM

## 2016-02-07 DIAGNOSIS — Z23 Encounter for immunization: Secondary | ICD-10-CM

## 2016-02-07 DIAGNOSIS — Z3481 Encounter for supervision of other normal pregnancy, first trimester: Secondary | ICD-10-CM

## 2016-02-07 NOTE — Progress Notes (Signed)
   PRENATAL VISIT NOTE  Subjective:  Rebekah Smith is a 30 y.o. [phone]  at 82w3dbeing seen today for ongoing prenatal care.  She is currently monitored for the following issues for this high-risk pregnancy and has History of postpartum hemorrhage; History of preeclampsia in prior pregnancy, currently pregnant; Supervision of normal pregnancy; and Kell isoimmunization during pregnancy on her problem list.  Patient reports vaginal discharge, unsure if BV still present.  Contractions: Not present. Vag. Bleeding: None.  Movement: Absent. Denies leaking of fluid.   The following portions of the patient's history were reviewed and updated as appropriate: allergies, current medications, past family history, past medical history, past social history, past surgical history and problem list. Problem list updated.  Objective:   Vitals:   02/07/16 1559  BP: 131/90  Pulse: 74  Weight: 160 lb (72.6 kg)    Fetal Status: Fetal Heart Rate (bpm): 169   Movement: Absent     General:  Alert, oriented and cooperative. Patient is in no acute distress.  Skin: Skin is warm and dry. No rash noted.   Cardiovascular: Normal heart rate noted  Respiratory: Normal respiratory effort, no problems with respiration noted  Abdomen: Soft, gravid, appropriate for gestational age. Pain/Pressure: Absent     Pelvic:  Cervical exam deferred        Extremities: Normal range of motion.  Edema: None  Mental Status: Normal mood and affect. Normal behavior. Normal judgment and thought content.   Assessment and Plan:  Pregnancy: [id number] 173w3d1. Encounter for supervision of other normal pregnancy in first trimester - MSAFP at next visit - [place] OB COMP + 14 WK; Future  2. Kell isoimmunization during pregnancy in first trimester, single or unspecified fetus   3. History of preeclampsia in prior pregnancy, currently pregnant - Start baby asa 81 mg this week - CBC - Comp Met (CMET) - Protein, Urine, 24  hour - Creatinine Clearance, Urine, 24 hour  4. History of postpartum hemorrhage   5. Vaginal discharge  - Wet prep, genital - WET PREP FOR TRICH, YEAST, CLUE  Preterm labor symptoms and general obstetric precautions including but not limited to vaginal bleeding, contractions, leaking of fluid and fetal movement were reviewed in detail with the patient. Please refer to After Visit Summary for other counseling recommendations.  Return in about 8 weeks (around 04/03/2016).   MyEmily FilbertMD

## 2016-02-07 NOTE — Addendum Note (Signed)
Addended by: Granville LewisLARK, Jaleena Viviani L on: 02/07/2016 04:32 PM   Modules accepted: Orders

## 2016-02-08 LAB — WET PREP FOR TRICH, YEAST, CLUE: Trich, Wet Prep: NONE SEEN

## 2016-02-10 ENCOUNTER — Telehealth: Payer: Self-pay | Admitting: *Deleted

## 2016-02-10 DIAGNOSIS — B3731 Acute candidiasis of vulva and vagina: Secondary | ICD-10-CM

## 2016-02-10 DIAGNOSIS — B9689 Other specified bacterial agents as the cause of diseases classified elsewhere: Secondary | ICD-10-CM

## 2016-02-10 DIAGNOSIS — B373 Candidiasis of vulva and vagina: Secondary | ICD-10-CM

## 2016-02-10 DIAGNOSIS — N760 Acute vaginitis: Secondary | ICD-10-CM

## 2016-02-10 MED ORDER — TERCONAZOLE 0.4 % VA CREA: 1.0000 | TOPICAL_CREAM | Freq: Every day | VAGINAL | 0 refills | Status: DC

## 2016-02-10 MED ORDER — METRONIDAZOLE 500 MG PO TABS: 500.0000 mg | ORAL_TABLET | Freq: Two times a day (BID) | ORAL | 0 refills | Status: DC

## 2016-02-10 NOTE — Telephone Encounter (Signed)
Pt notified of positive BV and yeast.  RX sent to [facility] for Flagyl and Terazol cream per Dr Marice Potterove.

## 2016-02-14 ENCOUNTER — Encounter (HOSPITAL_COMMUNITY): Payer: Self-pay

## 2016-02-14 ENCOUNTER — Ambulatory Visit (HOSPITAL_COMMUNITY)
Admission: RE | Admit: 2016-02-14 | Discharge: 2016-02-14 | Disposition: A | Discharge status: Home / Self Care | Payer: Medicaid Other | Source: Ambulatory Visit | Attending: Obstetrics & Gynecology | Admitting: Obstetrics & Gynecology

## 2016-02-14 ENCOUNTER — Other Ambulatory Visit: Payer: Self-pay | Admitting: Obstetrics & Gynecology

## 2016-02-14 VITALS — BP 125/73 | HR 66 | Wt 162.4 lb

## 2016-02-14 DIAGNOSIS — O361990 Maternal care for other isoimmunization, unspecified trimester, not applicable or unspecified: Secondary | ICD-10-CM

## 2016-02-14 DIAGNOSIS — O361910 Maternal care for other isoimmunization, first trimester, not applicable or unspecified: Secondary | ICD-10-CM | POA: Diagnosis not present

## 2016-02-14 DIAGNOSIS — O09291 Supervision of pregnancy with other poor reproductive or obstetric history, first trimester: Secondary | ICD-10-CM | POA: Insufficient documentation

## 2016-02-14 DIAGNOSIS — O09299 Supervision of pregnancy with other poor reproductive or obstetric history, unspecified trimester: Secondary | ICD-10-CM

## 2016-02-14 DIAGNOSIS — Z3682 Encounter for antenatal screening for nuchal translucency: Secondary | ICD-10-CM | POA: Diagnosis present

## 2016-02-14 DIAGNOSIS — Z3481 Encounter for supervision of other normal pregnancy, first trimester: Secondary | ICD-10-CM

## 2016-02-14 DIAGNOSIS — Z3A13 13 weeks gestation of pregnancy: Secondary | ICD-10-CM

## 2016-02-16 NOTE — Progress Notes (Signed)
Maternal Fetal Medicine Consultation  I had the pleasure of seeing your patient Rebekah Smith for Maternal-Fetal Medicine consultation on 02/14/2016. As you know, Rebekah Smith is a 30 y.o. [id number] at 8192w3d who presents for consultation regarding anti-Kell antibodies. She had new OB labs noting a positive antibody screen. The antibodies were subsequently identified as anti-Kell with a titer of 1:4.  Rebekah Smith obstetric history is notable for a history of blood transfusion in the setting of postpartum hemorrhage as well as preeclampsia. In her first pregnancy Rebekah Smith underwent induction of labor at 37 weeks in the setting of gestational hypertension. She had a vaginal delivery complicated by a postpartum hemorrhage but did not require a blood transfusion. In her second pregnancy she had a vaginal delivery at term for a 9lbs 9oz baby also complicated by a postpartum hemorrhage and did receive a blood transfusion at that time. Her third pregnancy ended in a first trimester loss. Her most recent pregnancy was complicated by the develoment of severe preeclampsia requiring delivery at 7629w6d. She does not report a postpartum hemorrhage following this delivery. Rebekah Smith reports her blood pressure normalized in the postpartum period and has remainder normal.  She denies any history of IV drug use or issues with jaundice or anemia in her prior children.  This pregnancy was conceived with a different partner than her prior pregnancies. She reports the father of this baby is of Caucasian ethnicity. Baseline preeclampsia labs were within normal limits including a creatinine of 0.61 and a urine protein to creatinine ratio of 0.13. Rebekah Smith recently started low-dose aspirin daily for preeclampsia risk reduction.  Prior to her visit today Rebekah Smith had an obstetric ultrasound for first trimester genetic screening which was within normal limits for gestational age. Please see separate document for fetal ultrasound  report.  The remainder of Rebekah Smith's medical history is unremarkable. She has no past surgical history. She takes prenatal vitamins, low-dose aspirin and flagyl for a recent diagnosis of BV and is allergic to no medications. Rebekah Smith denies alcohol, tobacco or other drug use. Her family history is noncontributory.  We discussed the following issues during her visit today: 1. Anti-Kell antibodies: We outlined the etiology and implications of anti-Kell antibodies in pregnancy and the risks of hemolytic disease of the fetus and newborn. We reviewed that maternal anti-Kell antibodies can cross the placenta and bind to fetal red blood cells causing hemolysis, as well as lead to myelosuppression and subsequent potentially severe fetal anemia. Anti-Kell antibodies cause earlier and more severe fetal anemia than other red cell antibodies and the risk of severe anemia correlates poorly with antibody titer compared with other alloantibodies. Untreated severe anemia can lead to fetal hydrops and death. This only occurs if the fetal red blood cells express the Kell antigen. Given Rebekah Smith's clinical history, I suspect that she was sensitized from a blood transfusion. Unfortunately, the father of the baby is no longer involved with the pregnancy, but I provided Rebekah Smith with a lab order for paternal blood antigen testing today and she will attempt to discuss testing with him. If paternal typing is negative for the Kell antigen, no further lab or ultrasound surveillance is indicated. If paternal typing notes homozygocity for the Kell antigen, if paternal heterozygocity is noted and amniocentesis confirms the fetus expresses the Kell antigen, or if paternal typing is unable to be performed we will plan on weekly fetal MCA Dopplers to screen for fetal anemia. If markedly elevated peak systolic velocities on MCA Dopplers are noted and confirmed, confirmation of fetal anemia  via a PUBS procedure and intrauterine transfusion  would be considered.   We discussed that maternal antibodies persist in the newborn for months, and that babies require surveillance for evidence of hemolysis and anemia. Jaundice and the need for transfusions in the first months of life are not uncommon. We also addressed the fact that hemolytic disease of the fetus and newborn can be more severe with subsequent pregnancies.  Maternal antibodies can also make it more difficult to crossmatch blood. I would recommend ordering a bloodtype and crossmatch when Rebekah Smith is admitted for delivery so that blood can be available if needed, particularly given her history of postpartum hemorrhage.  2. History of pre-eclampsia: Rebekah Smith has been well counseled on the implications for recurrent hypertensive disease in this pregnancy. Baseline preeclampsia labs are within normal limits and Rebekah Smith is taking low-dose as prescribed. We reviewed the importance of clinical vigilance for signs and symptoms of preeclampsia as the pregnancy progresses.  Thank you for the opportunity to be a part of the care of Rebekah Smith. At the conclusion of our visit, Rebekah Smith seemed to have a good understanding of the issues addressed above. Given the uncertainty regarding the availability of paternal antigen testing I made arrangements to have Rebekah Smith return for MCA dopplers at 16 weeks. If paternal antigen testing is negative for the Kell antigen this appointment may be cancelled. Please contact our office if we can be of further assistance.   I spent approximately 40 minutes with this patient with over 50% of time spent in face-to-face counseling.  Darlyn ReadEmily Bunce, MD

## 2016-02-17 ENCOUNTER — Telehealth (HOSPITAL_COMMUNITY): Payer: Self-pay | Admitting: *Deleted

## 2016-02-17 NOTE — Telephone Encounter (Signed)
Pt had called inquiring where FOB could have his blood work drawn.  TC to pt, LM for FOB to return to MFM for paternal antigen typing.

## 2016-02-21 ENCOUNTER — Telehealth (HOSPITAL_COMMUNITY): Payer: Self-pay | Admitting: *Deleted

## 2016-02-21 ENCOUNTER — Other Ambulatory Visit (HOSPITAL_COMMUNITY): Payer: Medicaid Other

## 2016-02-21 NOTE — Telephone Encounter (Signed)
TC to pt regarding FOB negative Kell antigen typing.  LM for pt to return call.

## 2016-02-21 NOTE — Telephone Encounter (Signed)
Pt returned TC to RN.  Pt informed of FOB neg antigen screen.  Pt had questions regarding upcoming appointments.  MCA doppler studies to be canceled.  Pt to keep her anatomy ultrasound on 03/20/16 at 3:30.  Pt voices understanding, no further questions.

## 2016-02-27 ENCOUNTER — Other Ambulatory Visit (HOSPITAL_COMMUNITY): Payer: Self-pay

## 2016-03-06 ENCOUNTER — Other Ambulatory Visit (HOSPITAL_COMMUNITY): Payer: Medicaid Other

## 2016-03-07 ENCOUNTER — Encounter: Payer: Self-pay | Admitting: *Deleted

## 2016-03-07 ENCOUNTER — Ambulatory Visit (INDEPENDENT_AMBULATORY_CARE_PROVIDER_SITE_OTHER): Payer: Medicaid Other | Admitting: Obstetrics & Gynecology

## 2016-03-07 DIAGNOSIS — Z3482 Encounter for supervision of other normal pregnancy, second trimester: Secondary | ICD-10-CM

## 2016-03-07 NOTE — Progress Notes (Signed)
SG 10.25  Orthostatic BP  Lying-119/79 p-75   Sitting-118/81 P-80 Standing-123/83  P-96

## 2016-03-07 NOTE — Progress Notes (Signed)
   PRENATAL VISIT NOTE  Subjective:  Rebekah Smith is a 30 y.o. [id number] at 1876w4d being seen today for ongoing prenatal care.  She is currently monitored for the following issues for this high-risk pregnancy and has History of postpartum hemorrhage; History of preeclampsia in prior pregnancy, currently pregnant; Supervision of normal pregnancy; and Kell isoimmunization during pregnancy on her problem list.  Patient reports no complaints.  Contractions: Not present. Vag. Bleeding: None.  Movement: Absent. Denies leaking of fluid.   The following portions of the patient's history were reviewed and updated as appropriate: allergies, current medications, past family history, past medical history, past social history, past surgical history and problem list. Problem list updated.  Objective:   Vitals:   03/07/16 0858  BP: 116/77  Weight: 162 lb (73.5 kg)    Fetal Status: Fetal Heart Rate (bpm): 147   Movement: Absent     General:  Alert, oriented and cooperative. Patient is in no acute distress.  Skin: Skin is warm and dry. No rash noted.   Cardiovascular: Normal heart rate noted  Respiratory: Normal respiratory effort, no problems with respiration noted  Abdomen: Soft, gravid, appropriate for gestational age. Pain/Pressure: Absent     Pelvic:  Cervical exam deferred        Extremities: Normal range of motion.  Edema: None  Mental Status: Normal mood and affect. Normal behavior. Normal judgment and thought content.   Assessment and Plan:  Pregnancy: [id number] at 7576w4d  1. 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  Pt is 100% sure that he is FOB.  Per MFM, no further testing is needed.  2.  History of severe pre E--on baby aspirin.  Baby scripts monitoring BP weekly.    3.  Refused AFP today; wants to wait on anatomy US.  4.  Lightheaded today/Jittery--orthostatics are nml.  Increase specific gravity--drink more H20.  CBG nml.  Increase protein at breakfast.    Preterm labor symptoms and  general obstetric precautions including but not limited to vaginal bleeding, contractions, leaking of fluid and fetal movement were reviewed in detail with the patient. Please refer to After Visit Summary for other counseling recommendations.  Return in about 6 weeks (around 04/18/2016).   Lesly DukesKelly H Shamyra Farias, MD

## 2016-03-07 NOTE — Progress Notes (Signed)
Not feeling well today, feeling very jittery.  Head feels fuzzy.

## 2016-03-13 ENCOUNTER — Ambulatory Visit (HOSPITAL_COMMUNITY): Payer: Medicaid Other

## 2016-03-20 ENCOUNTER — Ambulatory Visit (HOSPITAL_COMMUNITY)
Admission: RE | Admit: 2016-03-20 | Discharge: 2016-03-20 | Disposition: A | Discharge status: Home / Self Care | Payer: Medicaid Other | Source: Ambulatory Visit | Attending: Obstetrics & Gynecology | Admitting: Obstetrics & Gynecology

## 2016-03-20 ENCOUNTER — Ambulatory Visit (HOSPITAL_COMMUNITY): Payer: Medicaid Other

## 2016-03-20 ENCOUNTER — Encounter (HOSPITAL_COMMUNITY): Payer: Self-pay

## 2016-03-20 ENCOUNTER — Other Ambulatory Visit: Payer: Self-pay | Admitting: Obstetrics & Gynecology

## 2016-03-20 DIAGNOSIS — Z3482 Encounter for supervision of other normal pregnancy, second trimester: Secondary | ICD-10-CM

## 2016-03-20 DIAGNOSIS — Z3481 Encounter for supervision of other normal pregnancy, first trimester: Secondary | ICD-10-CM

## 2016-03-20 DIAGNOSIS — Z3A18 18 weeks gestation of pregnancy: Secondary | ICD-10-CM

## 2016-03-20 DIAGNOSIS — Z363 Encounter for antenatal screening for malformations: Secondary | ICD-10-CM

## 2016-03-20 DIAGNOSIS — O361920 Maternal care for other isoimmunization, second trimester, not applicable or unspecified: Secondary | ICD-10-CM | POA: Diagnosis not present

## 2016-03-20 DIAGNOSIS — O09292 Supervision of pregnancy with other poor reproductive or obstetric history, second trimester: Secondary | ICD-10-CM | POA: Diagnosis not present

## 2016-03-20 DIAGNOSIS — O361990 Maternal care for other isoimmunization, unspecified trimester, not applicable or unspecified: Secondary | ICD-10-CM

## 2016-03-20 DIAGNOSIS — O09299 Supervision of pregnancy with other poor reproductive or obstetric history, unspecified trimester: Secondary | ICD-10-CM

## 2016-03-21 ENCOUNTER — Other Ambulatory Visit (HOSPITAL_COMMUNITY): Payer: Self-pay | Admitting: *Deleted

## 2016-03-21 DIAGNOSIS — O361990 Maternal care for other isoimmunization, unspecified trimester, not applicable or unspecified: Secondary | ICD-10-CM

## 2016-03-27 ENCOUNTER — Ambulatory Visit (HOSPITAL_COMMUNITY): Payer: Medicaid Other

## 2016-03-27 ENCOUNTER — Encounter (HOSPITAL_COMMUNITY): Payer: Self-pay

## 2016-04-02 NOTE — L&D Delivery Note (Signed)
31 y.o. [id number] at [redacted]w[redacted]d delivered a viable female infant in cephalic, LOA position. No nuchal cord. Right anterior shoulder delivered with ease. 60 sec delayed cord clamping. Cord clamped x2 and cut. Placenta delivered spontaneously intact, with 3VC. Brisk bleeding occurred shortly after deliver of placenta, fundal massage, pitocin started, cytotec buccally, cytotec PR, and 0.25mg  IM hemabate, it was noted the lower uterine segment was boggy, but eventually firmed after medications given. Good hemostasis noted with minimal oozing.  Anesthesia: Epidural Laceration: NONE Suture: N/A Good hemostasis noted after medications and massage. EBL: 700cc  Mom and baby recovering in LDR.    Apgars: APGAR (1 MIN): 9   APGAR (5 MINS): 9    Weight: Pending skin to skin    Jen Mow, DO [facility] [employer] 07/14/2016, 11:46 AM

## 2016-04-03 ENCOUNTER — Encounter: Payer: Self-pay | Admitting: Obstetrics & Gynecology

## 2016-04-03 ENCOUNTER — Ambulatory Visit (INDEPENDENT_AMBULATORY_CARE_PROVIDER_SITE_OTHER): Payer: Medicaid Other | Admitting: Obstetrics & Gynecology

## 2016-04-03 VITALS — BP 124/81 | HR 72 | Wt 171.0 lb

## 2016-04-03 DIAGNOSIS — R002 Palpitations: Secondary | ICD-10-CM

## 2016-04-03 DIAGNOSIS — Z3482 Encounter for supervision of other normal pregnancy, second trimester: Secondary | ICD-10-CM

## 2016-04-03 NOTE — Progress Notes (Signed)
   PRENATAL VISIT NOTE  Subjective:  Rebekah Smith is a 31 y.o. [phone] at 1858w3d being seen today for ongoing prenatal care.  She is currently monitored for the following issues for this high-risk pregnancy and has History of postpartum hemorrhage; History of preeclampsia in prior pregnancy, currently pregnant; Supervision of normal pregnancy; and Kell isoimmunization during pregnancy on her problem list.  Patient reports no complaints.  Contractions: Not present. Vag. Bleeding: None.  Movement: Present. Denies leaking of fluid.   The following portions of the patient's history were reviewed and updated as appropriate: allergies, current medications, past family history, past medical history, past social history, past surgical history and problem list. Problem list updated.  Objective:   Vitals:   04/03/16 1606  BP: 124/81  Pulse: 72  Weight: 171 lb (77.6 kg)    Fetal Status: Fetal Heart Rate (bpm): 148   Movement: Present     General:  Alert, oriented and cooperative. Patient is in no acute distress.  Skin: Skin is warm and dry. No rash noted.   Cardiovascular: Normal heart rate noted  Respiratory: Normal respiratory effort, no problems with respiration noted  Abdomen: Soft, gravid, appropriate for gestational age. Pain/Pressure: Absent     Pelvic:  Cervical exam deferred        Extremities: Normal range of motion.  Edema: None  Mental Status: Normal mood and affect. Normal behavior. Normal judgment and thought content.   Assessment and Plan:  Pregnancy: [id number] at 5958w3d  1. Encounter for supervision of other normal pregnancy in second trimester -Pt taking twice weekly BP on Baby Scripts.  We are monitoring remotely via [place].   -F/U for incomplete anatomy -Pt refuses AFP today  2. Heart palpitations - Ambulatory referral to Cardiology  Preterm labor symptoms and general obstetric precautions including but not limited to vaginal bleeding, contractions, leaking of fluid  and fetal movement were reviewed in detail with the patient. Please refer to After Visit Summary for other counseling recommendations.  Return in about 8 weeks (around 05/29/2016).   Lesly DukesKelly H Leggett, MD

## 2016-04-17 ENCOUNTER — Other Ambulatory Visit (HOSPITAL_COMMUNITY): Payer: Self-pay | Admitting: Maternal and Fetal Medicine

## 2016-04-17 ENCOUNTER — Other Ambulatory Visit (HOSPITAL_COMMUNITY): Payer: Self-pay | Admitting: *Deleted

## 2016-04-17 ENCOUNTER — Ambulatory Visit (HOSPITAL_COMMUNITY)
Admission: RE | Admit: 2016-04-17 | Discharge: 2016-04-17 | Disposition: A | Discharge status: Home / Self Care | Payer: Medicaid Other | Source: Ambulatory Visit | Attending: Obstetrics & Gynecology | Admitting: Obstetrics & Gynecology

## 2016-04-17 ENCOUNTER — Encounter (HOSPITAL_COMMUNITY): Payer: Self-pay

## 2016-04-17 DIAGNOSIS — O09292 Supervision of pregnancy with other poor reproductive or obstetric history, second trimester: Secondary | ICD-10-CM | POA: Insufficient documentation

## 2016-04-17 DIAGNOSIS — O361920 Maternal care for other isoimmunization, second trimester, not applicable or unspecified: Secondary | ICD-10-CM | POA: Insufficient documentation

## 2016-04-17 DIAGNOSIS — O361990 Maternal care for other isoimmunization, unspecified trimester, not applicable or unspecified: Secondary | ICD-10-CM

## 2016-04-17 DIAGNOSIS — O09299 Supervision of pregnancy with other poor reproductive or obstetric history, unspecified trimester: Secondary | ICD-10-CM

## 2016-04-17 DIAGNOSIS — Z3A22 22 weeks gestation of pregnancy: Secondary | ICD-10-CM

## 2016-04-17 DIAGNOSIS — Z3482 Encounter for supervision of other normal pregnancy, second trimester: Secondary | ICD-10-CM

## 2016-04-17 DIAGNOSIS — Z362 Encounter for other antenatal screening follow-up: Secondary | ICD-10-CM | POA: Insufficient documentation

## 2016-04-18 ENCOUNTER — Ambulatory Visit: Payer: Medicaid Other | Admitting: Physician Assistant

## 2016-04-25 ENCOUNTER — Ambulatory Visit: Payer: Medicaid Other | Admitting: Physician Assistant

## 2016-05-02 ENCOUNTER — Encounter: Payer: Self-pay | Admitting: Physician Assistant

## 2016-05-02 ENCOUNTER — Ambulatory Visit (INDEPENDENT_AMBULATORY_CARE_PROVIDER_SITE_OTHER): Payer: Medicaid Other | Admitting: Physician Assistant

## 2016-05-02 VITALS — BP 112/64 | HR 90 | Ht 67.0 in | Wt 181.2 lb

## 2016-05-02 DIAGNOSIS — Z79899 Other long term (current) drug therapy: Secondary | ICD-10-CM | POA: Diagnosis not present

## 2016-05-02 DIAGNOSIS — R002 Palpitations: Secondary | ICD-10-CM | POA: Diagnosis not present

## 2016-05-02 DIAGNOSIS — Z3A24 24 weeks gestation of pregnancy: Secondary | ICD-10-CM | POA: Diagnosis not present

## 2016-05-02 MED ORDER — METOPROLOL TARTRATE 25 MG PO TABS: 12.5000 mg | ORAL_TABLET | Freq: Every day | ORAL | 3 refills | Status: DC | PRN

## 2016-05-02 NOTE — Progress Notes (Signed)
Cardiology Office Note    Date:  05/02/2016   ID:  Rebekah Smith, DOB 07/19/1985, MRN [id number]  PCP:  Renne Muscaaniel L Warden, MD  Cardiologist:  Dr. Duke Salviaandolph  Chief Complaint  Patient presents with  . New Evaluation    referred by Dr. Elsie LincolnKelly Leggett for palpitations  pt states palpitations are worse when laying down, says probably 4-5 episodes per hour; c/o SOB on exertion    History of Present Illness:  Rebekah Smith is a 31 y.o. female who is currently 24 weeks and 4 days pregnant presents for evaluation of palpitation. She is [id number]. She had one miscarriage. She has a history of preeclampsia with prior pregnancy. She was treated for BV and Yeast infection in November. She says she has not had any significant issue during this pregnancy other than palpitation. She is had palpitations before with previous pregnancies, however never been this frequent. He describes the palpitations as single skipped beat followed by pounding sensation in the chest. She notices them more when she is at rest in the laying down at night. She also mentions her baseline heart rate is usually in the 55-60 range outside pregnancy, during pregnancy her heart rate usually with increased up to 90 bpm. I told her this is a physiological response related to the pregnancy itself. She also has a mother who had a heart murmur, however on physical exam, I did not appreciate any significant heart murmur in her. I have discussed with Dr. Duke Salviaandolph, I think her symptom is most likely related to PVC. I don't think there is any clinical benefits of obtaining a 24-hour Holter monitor or echocardiogram in this case, nor do I think it will change the treatment plan. I think this can be managed with medication only. We have given her 12.5 mg as needed dose of metoprolol. She says she drinks 2 cups of soda per day, I encouraged her to cut back. We will check a TSH as well to make sure is normal. Her last TSH was normal in 2016.  Otherwise she can follow-up with cardiology service on an as-needed basis.   Past Medical History:  Diagnosis Date  . Abnormal Pap smear of cervix    colposcopy  . Anemia   . Blood transfusion   . Hemorrhage   . History of preeclampsia    Had in two last pregnancies, denies chronic hypertension as she is normotensive when not pregnant    Past Surgical History:  Procedure Laterality Date  . TONSILLECTOMY    . TONSILLECTOMY      Current Medications: Outpatient Medications Prior to Visit  Medication Sig Dispense Refill  . aspirin EC 81 MG tablet Take 1 tablet (81 mg total) by mouth daily. Take after 12 weeks for prevention of preeclampsia later in pregnancy 300 tablet 2  . Prenatal Multivit-Min-Fe-FA (PRENATAL VITAMINS PO) Take 1 tablet by mouth daily.     . famotidine (PEPCID) 20 MG tablet TK 1 T PO BID  11  . tamsulosin (FLOMAX) 0.4 MG CAPS capsule Take 1 capsule (0.4 mg total) by mouth daily after supper. (Patient not taking: Reported on 04/17/2016) 7 capsule 0   No facility-administered medications prior to visit.      Allergies:   Patient has no known allergies.   Social History   Social History  . Marital status: Single    Spouse name: N/A  . Number of children: N/A  . Years of education: N/A   Occupational History  . waitress  [employer]   Social History Main Topics  . Smoking status: Never Smoker  . Smokeless tobacco: Never Used  . Alcohol use No  . Drug use: No  . Sexual activity: Yes    Partners: Male    Birth control/ protection: None   Other Topics Concern  . None   Social History Narrative  . None     Family History:  The patient's family history includes Anxiety disorder in her mother; Breast cancer in her maternal aunt and paternal grandmother; Cervical cancer in her maternal aunt; Heart murmur in her mother; Hypertension in her mother; Stroke in her maternal grandmother.   ROS:   Please see the history of present illness.    ROS All other  systems reviewed and are negative.   PHYSICAL EXAM:   VS:  BP 112/64 (BP Location: Left Arm, Patient Position: Sitting, Cuff Size: Large)   Pulse 90   Ht 5\' 7"  (1.702 m)   Wt 181 lb 3.2 oz (82.2 kg)   LMP 11/12/2015   BMI 28.38 kg/m    GEN: Well nourished, well developed, in no acute distress  HEENT: normal  Neck: no JVD, carotid bruits, or masses Cardiac: RRR; no murmurs, rubs, or gallops,no edema  Respiratory:  clear to auscultation bilaterally, normal work of breathing GI: soft, nontender, nondistended, + BS MS: no deformity or atrophy  Skin: warm and dry, no rash Neuro:  Alert and Oriented x 3, Strength and sensation are intact Psych: euthymic mood, full affect  Wt Readings from Last 3 Encounters:  05/02/16 181 lb 3.2 oz (82.2 kg)  04/17/16 177 lb (80.3 kg)  04/03/16 171 lb (77.6 kg)      Studies/Labs Reviewed:   EKG:  EKG is ordered today.  The ekg ordered today demonstrates Normal sinus rhythm without significant ST-T wave changes, heart rate 90.  Recent Labs: 01/10/2016: ALT 19; BUN 9; Creat 0.61; Hemoglobin 13.3; Platelets 248; Potassium 4.4; Sodium 135   Lipid Panel No results found for: CHOL, TRIG, HDL, CHOLHDL, VLDL, LDLCALC, LDLDIRECT  Additional studies/ records that were reviewed today include:   OB/GYN record.    ASSESSMENT:    1. Palpitations   2. Medication management   3. [redacted] weeks gestation of pregnancy      PLAN:  In order of problems listed above:  1. Palpitation: Her palpitation initiated as a skipped beat followed by pounding sensation, I think she has symptomatic PVC. I did not appreciate significant heart murmur on physical exam. I do not think we need echocardiogram at this time. I have also encouraged her to cut back on caffeinated drinks. We will obtain a TSH. Otherwise I think we can manage this to medication. We will use metoprolol tartrate 12.5 mg as needed to help with palpitation. She has also taken labetalol before for blood  pressure control during pregnancy, we chose metoprolol because her blood pressure is borderline and metoprolol has less chance of lowering the blood pressure too much.  2. [redacted] weeks gestation of pregnancy: She is currently being followed by OB/GYN she has a history of preeclampsia during the previous pregnancy.    Medication Adjustments/Labs and Tests Ordered: Current medicines are reviewed at length with the patient today.  Concerns regarding medicines are outlined above.  Medication changes, Labs and Tests ordered today are listed in the Patient Instructions below. Patient Instructions  Medication Instructions:  START METOPROLOL 12.5MG  (1/2 25MG  TABLET) AS NEEDED   If you need a refill on your cardiac medications  before your next appointment, please call your pharmacy.  Labwork: TSH TODAY AT [facility] ON THE 1ST FLOOR  Testing/Procedures: NONE  Follow-Up: Your physician recommends that you follow-up WITH DR [facility] AS NEEDED  Special Instructions: RECOMMENDED TO DECREASE CAFFEINATED  BEVERAGES    Thank you for choosing [facility] at [facility], LPN Moussa Wiegand, PA-C    Signed, Azalee Course, [place]  05/02/2016 11:20 PM    [facility] [address] [place], [place], [place]  [address] Phone: [phone]; Fax: [phone]

## 2016-05-02 NOTE — Patient Instructions (Signed)
Medication Instructions:  START METOPROLOL 12.5MG  (1/2 25MG  TABLET) AS NEEDED   If you need a refill on your cardiac medications before your next appointment, please call your pharmacy.  Labwork: TSH TODAY AT [facility] ON THE 1ST FLOOR  Testing/Procedures: NONE  Follow-Up: Your physician recommends that you [facility]  Special Instructions: RECOMMENDED TO DECREASE CAFFEINATED  BEVERAGES    Thank you for choosing [facility] at [facility]!!    Marcelino DusterMichelle, LPN HAO MENG, PA-C

## 2016-05-24 ENCOUNTER — Ambulatory Visit (INDEPENDENT_AMBULATORY_CARE_PROVIDER_SITE_OTHER): Payer: Medicaid Other | Admitting: Obstetrics & Gynecology

## 2016-05-24 ENCOUNTER — Encounter: Payer: Medicaid Other | Admitting: Obstetrics & Gynecology

## 2016-05-24 VITALS — BP 128/76 | HR 90 | Temp 97.3°F | Wt 185.0 lb

## 2016-05-24 DIAGNOSIS — O09299 Supervision of pregnancy with other poor reproductive or obstetric history, unspecified trimester: Secondary | ICD-10-CM

## 2016-05-24 DIAGNOSIS — Z3482 Encounter for supervision of other normal pregnancy, second trimester: Secondary | ICD-10-CM

## 2016-05-24 DIAGNOSIS — O09292 Supervision of pregnancy with other poor reproductive or obstetric history, second trimester: Secondary | ICD-10-CM

## 2016-05-24 MED ORDER — AZITHROMYCIN 250 MG PO TABS: ORAL_TABLET | ORAL | 0 refills | Status: DC

## 2016-05-24 NOTE — Progress Notes (Signed)
   PRENATAL VISIT NOTE  Subjective:  Rebekah Smith is a 31 y.o. [phone] at 3043w5d being seen today for ongoing prenatal care.  She is currently monitored for the following issues for this high-risk pregnancy and has History of postpartum hemorrhage; History of preeclampsia in prior pregnancy, currently pregnant; Supervision of normal pregnancy; and Kell isoimmunization during pregnancy on her problem list.  Patient reports complains of several days of productive cough (green/yellow) along with a new rash on her face and a sore tongue..  Contractions: Not present. Vag. Bleeding: None.  Movement: Present. Denies leaking of fluid.   The following portions of the patient's history were reviewed and updated as appropriate: allergies, current medications, past family history, past medical history, past social history, past surgical history and problem list. Problem list updated.  Objective:   Vitals:   05/24/16 1300  BP: 128/76  Pulse: 90  Temp: 97.3 F (36.3 C)  Weight: 83.9 kg (185 lb)    Fetal Status: Fetal Heart Rate (bpm): 141lb   Movement: Present     General:  Alert, oriented and cooperative. Patient is in no acute distress.  Skin: Skin is warm and dry. No rash noted.   Cardiovascular: Normal heart rate noted  Respiratory: Normal respiratory effort, no problems with respiration noted  Abdomen: Soft, gravid, appropriate for gestational age. Pain/Pressure: Absent     Pelvic:  Cervical exam deferred        Extremities: Normal range of motion.  Edema: None  Mental Status: Normal mood and affect. Normal behavior. Normal judgment and thought content.   Assessment and Plan:  Pregnancy: [id number] at 4643w5d  1. History of preeclampsia in prior pregnancy, currently pregnant   2. Encounter for supervision of other normal pregnancy in second trimester 3. I suspect that the rash and tongue changes are viral but that the cough is bacterial. I will treat her with a zpack. She will RTC in 1  week for her labs/2 hour GTT  Preterm labor symptoms and general obstetric precautions including but not limited to vaginal bleeding, contractions, leaking of fluid and fetal movement were reviewed in detail with the patient. Please refer to After Visit Summary for other counseling recommendations.  No Follow-up on file.   Allie BossierMyra C Tema Alire, MD

## 2016-05-24 NOTE — Progress Notes (Signed)
Face is breaking out but denies itching. Cough, congestion and having hard time sleeping due to the cough.  Delsym not helping

## 2016-05-29 ENCOUNTER — Encounter (HOSPITAL_COMMUNITY): Payer: Self-pay

## 2016-05-29 ENCOUNTER — Ambulatory Visit (HOSPITAL_COMMUNITY)
Admission: RE | Admit: 2016-05-29 | Discharge: 2016-05-29 | Disposition: A | Discharge status: Home / Self Care | Payer: Medicaid Other | Source: Ambulatory Visit | Attending: Obstetrics & Gynecology | Admitting: Obstetrics & Gynecology

## 2016-05-29 DIAGNOSIS — Z3A28 28 weeks gestation of pregnancy: Secondary | ICD-10-CM | POA: Insufficient documentation

## 2016-05-29 DIAGNOSIS — O361930 Maternal care for other isoimmunization, third trimester, not applicable or unspecified: Secondary | ICD-10-CM | POA: Diagnosis not present

## 2016-05-29 DIAGNOSIS — O361990 Maternal care for other isoimmunization, unspecified trimester, not applicable or unspecified: Secondary | ICD-10-CM

## 2016-05-29 DIAGNOSIS — O09293 Supervision of pregnancy with other poor reproductive or obstetric history, third trimester: Secondary | ICD-10-CM | POA: Diagnosis not present

## 2016-05-30 ENCOUNTER — Encounter: Payer: Medicaid Other | Admitting: Obstetrics & Gynecology

## 2016-05-30 ENCOUNTER — Other Ambulatory Visit: Payer: Medicaid Other

## 2016-05-30 DIAGNOSIS — Z3493 Encounter for supervision of normal pregnancy, unspecified, third trimester: Secondary | ICD-10-CM

## 2016-05-30 LAB — CBC
HEMATOCRIT: 35.4 % (ref 35.0–45.0)
HEMOGLOBIN: 12.1 g/dL (ref 11.7–15.5)
MCH: 31.2 pg (ref 27.0–33.0)
MCHC: 34.2 g/dL (ref 32.0–36.0)
MCV: 91.2 fL (ref 80.0–100.0)
MPV: 10.8 fL (ref 7.5–12.5)
Platelets: 188 10*3/uL (ref 140–400)
RBC: 3.88 MIL/uL (ref 3.80–5.10)
RDW: 13 % (ref 11.0–15.0)
WBC: 8.2 10*3/uL (ref 3.8–10.8)

## 2016-05-31 LAB — HIV ANTIBODY (ROUTINE TESTING W REFLEX): HIV 1&2 Ab, 4th Generation: NONREACTIVE

## 2016-05-31 LAB — GLUCOSE TOLERANCE, 2 HOURS W/ 1HR
GLUCOSE, 2 HOUR: 61 mg/dL — AB (ref <140)
GLUCOSE, FASTING: 75 mg/dL (ref 65–99)
GLUCOSE: 93 mg/dL

## 2016-05-31 LAB — RPR

## 2016-06-05 ENCOUNTER — Ambulatory Visit (INDEPENDENT_AMBULATORY_CARE_PROVIDER_SITE_OTHER): Payer: Medicaid Other | Admitting: Obstetrics & Gynecology

## 2016-06-05 ENCOUNTER — Encounter: Payer: Medicaid Other | Admitting: Obstetrics & Gynecology

## 2016-06-05 VITALS — BP 149/96 | HR 107 | Wt 189.0 lb

## 2016-06-05 DIAGNOSIS — O133 Gestational [pregnancy-induced] hypertension without significant proteinuria, third trimester: Secondary | ICD-10-CM | POA: Diagnosis not present

## 2016-06-05 DIAGNOSIS — Z3A29 29 weeks gestation of pregnancy: Secondary | ICD-10-CM

## 2016-06-05 MED ORDER — BETAMETHASONE SOD PHOS & ACET 6 (3-3) MG/ML IJ SUSP
12.0000 mg | Freq: Once | INTRAMUSCULAR | Status: AC
  Administered 2016-06-05: 12 mg via INTRAMUSCULAR

## 2016-06-05 MED ORDER — LABETALOL HCL 200 MG PO TABS: 200.0000 mg | ORAL_TABLET | Freq: Two times a day (BID) | ORAL | 3 refills | Status: DC

## 2016-06-05 NOTE — Progress Notes (Signed)
BP on right arm 149/96. BP on left arm 149/102.

## 2016-06-05 NOTE — Progress Notes (Signed)
   PRENATAL VISIT NOTE  Subjective:  Rebekah Smith is a 31 y.o. [phone] at 55110w3d being seen today for ongoing prenatal care.  She is currently monitored for the following issues for this high-risk pregnancy and has History of postpartum hemorrhage; History of preeclampsia in prior pregnancy, currently pregnant; Supervision of normal pregnancy; and Kell isoimmunization during pregnancy on her problem list.  Patient reports no complaints.  Contractions: Not present. Vag. Bleeding: None.  Movement: Present. Denies leaking of fluid.   The following portions of the patient's history were reviewed and updated as appropriate: allergies, current medications, past family history, past medical history, past social history, past surgical history and problem list. Problem list updated.  Objective:   Vitals:   06/05/16 1306  BP: (!) 149/96  Pulse: (!) 107  Weight: 189 lb (85.7 kg)    Fetal Status: Fetal Heart Rate (bpm): 134   Movement: Present     General:  Alert, oriented and cooperative. Patient is in no acute distress.  Skin: Skin is warm and dry. No rash noted.   Cardiovascular: Normal heart rate noted  Respiratory: Normal respiratory effort, no problems with respiration noted  Abdomen: Soft, gravid, appropriate for gestational age. Pain/Pressure: Absent     Pelvic:  Cervical exam deferred        Extremities: Normal range of motion.  Edema: Trace  Mental Status: Normal mood and affect. Normal behavior. Normal judgment and thought content.   Assessment and Plan:  Pregnancy: [id number] at 21110w3d NST reactive 1. [redacted] weeks gestation of pregnancy 2. Elevated BP, h/o pre eclampsia with IOL at 33 weeks - will do labs and 24 hour urine - start labetalol 200 mg BID - Tdap vaccine greater than or equal to 7yo IM - start betamethasone Preterm labor symptoms and general obstetric precautions including but not limited to vaginal bleeding, contractions, leaking of fluid and fetal movement were  reviewed in detail with the patient. Please refer to After Visit Summary for other counseling recommendations.  No Follow-up on file.   Allie BossierMyra C Johnelle Tafolla, MD

## 2016-06-05 NOTE — Progress Notes (Signed)
Pt's BP running high and had PIH in last pregnancy

## 2016-06-06 ENCOUNTER — Encounter (HOSPITAL_COMMUNITY): Payer: Self-pay

## 2016-06-06 ENCOUNTER — Ambulatory Visit: Payer: Medicaid Other | Admitting: *Deleted

## 2016-06-06 ENCOUNTER — Ambulatory Visit (INDEPENDENT_AMBULATORY_CARE_PROVIDER_SITE_OTHER): Payer: Medicaid Other | Admitting: Obstetrics & Gynecology

## 2016-06-06 ENCOUNTER — Inpatient Hospital Stay (HOSPITAL_COMMUNITY)
Admission: AD | Admit: 2016-06-06 | Discharge: 2016-06-06 | Disposition: A | Discharge status: Home / Self Care | Payer: Medicaid Other | Source: Ambulatory Visit | Attending: Obstetrics & Gynecology | Admitting: Obstetrics & Gynecology

## 2016-06-06 VITALS — BP 126/77 | HR 101 | Wt 187.0 lb

## 2016-06-06 DIAGNOSIS — O09299 Supervision of pregnancy with other poor reproductive or obstetric history, unspecified trimester: Secondary | ICD-10-CM

## 2016-06-06 DIAGNOSIS — O26893 Other specified pregnancy related conditions, third trimester: Secondary | ICD-10-CM | POA: Diagnosis not present

## 2016-06-06 DIAGNOSIS — Z3A29 29 weeks gestation of pregnancy: Secondary | ICD-10-CM | POA: Diagnosis not present

## 2016-06-06 DIAGNOSIS — R1011 Right upper quadrant pain: Secondary | ICD-10-CM | POA: Diagnosis present

## 2016-06-06 DIAGNOSIS — R030 Elevated blood-pressure reading, without diagnosis of hypertension: Secondary | ICD-10-CM | POA: Insufficient documentation

## 2016-06-06 DIAGNOSIS — Z7982 Long term (current) use of aspirin: Secondary | ICD-10-CM | POA: Insufficient documentation

## 2016-06-06 DIAGNOSIS — O09293 Supervision of pregnancy with other poor reproductive or obstetric history, third trimester: Secondary | ICD-10-CM

## 2016-06-06 HISTORY — DX: Gestational (pregnancy-induced) hypertension without significant proteinuria, unspecified trimester: O13.9

## 2016-06-06 LAB — CBC
HEMATOCRIT: 34.4 % — AB (ref 36.0–46.0)
Hemoglobin: 11.9 g/dL — ABNORMAL LOW (ref 12.0–15.0)
MCH: 31.6 pg (ref 26.0–34.0)
MCHC: 34.6 g/dL (ref 30.0–36.0)
MCV: 91.2 fL (ref 78.0–100.0)
Platelets: 176 10*3/uL (ref 150–400)
RBC: 3.77 MIL/uL — AB (ref 3.87–5.11)
RDW: 13.2 % (ref 11.5–15.5)
WBC: 13.8 10*3/uL — ABNORMAL HIGH (ref 4.0–10.5)

## 2016-06-06 LAB — PROTEIN, URINE, 24 HOUR
Collection Interval-UPROT: 24 hours
Protein, 24H Urine: 113 mg/d — ABNORMAL HIGH (ref 50–100)
Protein, Urine: 9 mg/dL
Urine Total Volume-UPROT: 1250 mL

## 2016-06-06 LAB — COMPREHENSIVE METABOLIC PANEL
ALK PHOS: 63 U/L (ref 38–126)
ALT: 10 U/L — AB (ref 14–54)
AST: 23 U/L (ref 15–41)
Albumin: 3.2 g/dL — ABNORMAL LOW (ref 3.5–5.0)
Anion gap: 9 (ref 5–15)
BILIRUBIN TOTAL: 0.6 mg/dL (ref 0.3–1.2)
BUN: 8 mg/dL (ref 6–20)
CALCIUM: 8.4 mg/dL — AB (ref 8.9–10.3)
CO2: 21 mmol/L — ABNORMAL LOW (ref 22–32)
CREATININE: 0.59 mg/dL (ref 0.44–1.00)
Chloride: 104 mmol/L (ref 101–111)
GFR calc Af Amer: >60 mL/min (ref >60)
Glucose, Bld: 108 mg/dL — ABNORMAL HIGH (ref 65–99)
Potassium: 3.5 mmol/L (ref 3.5–5.1)
Sodium: 134 mmol/L — ABNORMAL LOW (ref 135–145)
TOTAL PROTEIN: 6.4 g/dL — AB (ref 6.5–8.1)

## 2016-06-06 LAB — PROTEIN / CREATININE RATIO, URINE
Creatinine, Urine: 175 mg/dL
Protein Creatinine Ratio: 0.08 mg/mg{Cre} (ref 0.00–0.15)
TOTAL PROTEIN, URINE: 14 mg/dL

## 2016-06-06 MED ORDER — BETAMETHASONE SOD PHOS & ACET 6 (3-3) MG/ML IJ SUSP
12.0000 mg | Freq: Once | INTRAMUSCULAR | Status: AC
  Administered 2016-06-06: 12 mg via INTRAMUSCULAR
  Filled 2016-06-06: qty 2

## 2016-06-06 NOTE — MAU Provider Note (Signed)
History     CSN: [id number]  Arrival date and time: 06/06/16 1149   First Provider Initiated Contact with Patient 06/06/16 1259      Chief Complaint  Patient presents with  . Hypertension   HPI  HPI: [name] is a 31 yo [phone] at 9059w4d by LMP and a 6 week US. She presents to MAU today with concern for preeclampsia.  She has a history of preeclampsia requiring delivery at 33 weeks during her last pregnancy.  She has been monitoring her blood pressures at home, typically 110s to low 120s/70s-80s.  Around 1 week ago she noted her pressures were starting to increase.  She has had 2 diastolic measurements >90 at home.  She was seen by Dr. Marice Potterove in clinic yesterday where her blood pressure was 149/96.  She was given betamethasone, started on labetalol 200 mg BID and labs + 24h urine were ordered.    This morning she was woken from sleep with stabbing right upper quadrant pain that limits her ability to take a deep breath.  This is associated with a headache that comes and goes, blurred vision, facial swelling & flushing, and tingling fingertips.  Her last dose of labetalol was last night at 2300.  +FM, no ctx, no LOF, no VB.  OB History    Gravida Para Term Preterm AB Living   5 3 2 1 1 3    SAB TAB Ectopic Multiple Live Births   1     0 3      Past Medical History:  Diagnosis Date  . Abnormal Pap smear of cervix    colposcopy  . Anemia   . Blood transfusion   . Hemorrhage   . History of preeclampsia    Had in two last pregnancies, denies chronic hypertension as she is normotensive when not pregnant  . Pregnancy induced hypertension     Past Surgical History:  Procedure Laterality Date  . TONSILLECTOMY      Family History  Problem Relation Age of Onset  . Anxiety disorder Mother   . Hypertension Mother   . Heart murmur Mother   . Breast cancer Maternal Aunt   . Breast cancer Paternal Grandmother   . Cervical cancer Maternal Aunt   . Stroke Maternal Grandmother      Social History  Substance Use Topics  . Smoking status: Never Smoker  . Smokeless tobacco: Never Used  . Alcohol use No    Allergies: No Known Allergies  Prescriptions Prior to Admission  Medication Sig Dispense Refill Last Dose  . acetaminophen (TYLENOL) 325 MG tablet Take 325 mg by mouth every 6 (six) hours as needed.    06/05/2016 at Unknown time  . aspirin EC 81 MG tablet Take 1 tablet (81 mg total) by mouth daily. Take after 12 weeks for prevention of preeclampsia later in pregnancy 300 tablet 2 06/05/2016 at Unknown time  . labetalol (NORMODYNE) 200 MG tablet Take 1 tablet (200 mg total) by mouth 2 (two) times daily. 60 tablet 3 06/05/2016 at 2200  . Prenatal Multivit-Min-Fe-FA (PRENATAL VITAMINS PO) Take 1 tablet by mouth daily.    06/05/2016 at Unknown time  . azithromycin (ZITHROMAX) 250 MG tablet Take 2 pills orally on first day, and then 1 daily. (Patient not taking: Reported on 06/06/2016) 6 tablet 0 Completed Course at Unknown time  . metoprolol tartrate (LOPRESSOR) 25 MG tablet Take 0.5 tablets (12.5 mg total) by mouth daily as needed. 30 tablet 3 never filled  Review of Systems  Constitutional: Negative for fever.  HENT: Positive for facial swelling.   Eyes: Positive for visual disturbance.  Respiratory: Positive for shortness of breath.   Cardiovascular: Negative for chest pain and leg swelling.  Gastrointestinal: Negative for abdominal pain.  Genitourinary: Negative for decreased urine volume, dysuria and vaginal bleeding.  Skin: Negative for rash.  Neurological: Positive for headaches.   Physical Exam   Blood pressure 136/81, pulse 94, temperature 97.6 F (36.4 C), temperature source Oral, resp. rate 20, weight 188 lb 12 oz (85.6 kg), last menstrual period 11/12/2015, SpO2 100 %.  Physical Exam  Constitutional: She is oriented to person, place, and time. She appears well-developed and well-nourished.  HENT:  Head: Normocephalic.  Right Ear: External ear normal.   Left Ear: External ear normal.  Mouth/Throat: Oropharynx is clear and moist.  Eyes: Conjunctivae and EOM are normal.  Periorbital edema  Neck: Normal range of motion. Neck supple.  Cardiovascular: Normal rate and regular rhythm.   No murmur heard. Respiratory: Effort normal and breath sounds normal. She has no wheezes. She has no rales.  GI: Soft.  RUQ tenderness, negative Murphy's sign  Musculoskeletal: She exhibits no edema.  Neurological: She is alert and oriented to person, place, and time.  Skin: Skin is warm and dry.  Flushing of chest, face  Psychiatric: She has a normal mood and affect.    MAU Course  Procedures  MDM: CBC, CMP, P:C ordered - reviewed, wnl Given betamethasone Bps wnl in MAU  Assessment and Plan  30 yo [id number] at 29.4 with pmh of preeclampsia presenting with elevated BPs at home and clinic, now with RUQ pain, headache and visual disturbance.  RUQ pain:  Etiology unclear; doubt cholecystitis/cholelithiasis with essentially normal CMP, no leukocytosis.  No preeclampsia based on labs.  --labs negative for preeclampsia --24h urine collection sent --continue labetalol --BP check tomorrow in clinic  Rebekah Smith 06/06/2016, 1:38 PM   OB FELLOW DISCHARGE ATTESTATION  I have seen and examined this patient and agree with above documentation in the resident's note.    Hent is a 31 year old G5 P3 at 29 weeks and 4 days who presents with concerns from office for preeclampsia. She has a history of severe preeclampsia requiring delivery at 33 weeks. Yesterday she had blood pressures elevated in the office and was started on labetalol. She states she woke up this morning with severe right upper quadrant pain. She reports ongoing pain with movement. She denies any nausea or vomiting. She reports she has not taken her blood pressure medicines today.  Gen.: Well-appearing no acute distress Vascular: Regular rate and intact distal pulses Pulmonary: No respiratory  distress no audible wheezing Abdomen: Soft minimally tender in the right upper quadrant with palpation of fetal parts, no positive Murphy sign, no rebound or guarding gravid Extremities: 1+ edema bilaterally Skin: Mild Flushing noted on face and chest were worse with talking and anxiety  Vitals:   06/06/16 1430 06/06/16 1432 06/06/16 1447 06/06/16 1503  BP: 130/80 130/80 130/71 123/75  Pulse: 104 104 93 99  Resp:    18  Temp:      TempSrc:      SpO2:      Weight:        Assessment and plan: #1: Concern for high blood pressure patient with completely normal blood pressures in a review without taking labetalol in the past 12 hours. Labs all noted to be normal. Will DC home with precautions. BP check in the office  tomorrow. 24 hour urine sent to the lab.   Ernestina Penna, MD 10:03 AM

## 2016-06-06 NOTE — MAU Note (Signed)
Was at dr yesterday elevated BP, is in process of doing 24 hr urine.  Received first dose of Betamethasone yesterday.   Called with rib pain today, was seen at office and sent in. Was told would get 2nd dose here.  Slight HA, , far off blurring,  Swelling in face, fingers tingling, having pain in RUQ, esp when she breaths in hard. Was started on Labetalol yesterday

## 2016-06-06 NOTE — Progress Notes (Signed)
She is here with severe RUQ pain today. She is in the middle of her 24 hour urine collection. She got her first betamethasone yesterday. She will go to the MAU now for further evaluation.

## 2016-06-06 NOTE — Discharge Instructions (Signed)
Hypertension During Pregnancy Hypertension is also called high blood pressure. High blood pressure means that the force of your blood moving in your body is too strong. When you are pregnant, this condition should be watched carefully. It can cause problems for you and your baby. Follow these instructions at home: Eating and drinking  Drink enough fluid to keep your pee (urine) clear or pale yellow.  Eat healthy foods that are low in salt (sodium). ? Do not add salt to your food. ? Check labels on foods and drinks to see much salt is in them. Look on the label where you see "Sodium." Lifestyle  Do not use any products that contain nicotine or tobacco, such as cigarettes and e-cigarettes. If you need help quitting, ask your doctor.  Do not use alcohol.  Avoid caffeine.  Avoid stress. Rest and get plenty of sleep. General instructions  Take over-the-counter and prescription medicines only as told by your doctor.  While lying down, lie on your left side. This keeps pressure off your baby.  While sitting or lying down, raise (elevate) your feet. Try putting some pillows under your lower legs.  Exercise regularly. Ask your doctor what kinds of exercise are best for you.  Keep all prenatal and follow-up visits as told by your doctor. This is important. Contact a doctor if:  You have symptoms that your doctor told you to watch for, such as: ? Fever. ? Throwing up (vomiting). ? Headache. Get help right away if:  You have very bad pain in your belly (abdomen).  You are throwing up, and this does not get better with treatment.  You suddenly get swelling in your hands, ankles, or face.  You gain 4 lb (1.8 kg) or more in 1 week.  You get bleeding from your vagina.  You have blood in your pee.  You do not feel your baby moving as much as normal.  You have a change in vision.  You have muscle twitching or sudden tightening (spasms).  You have trouble breathing.  Your lips  or fingernails turn blue. This information is not intended to replace advice given to you by your health care provider. Make sure you discuss any questions you have with your health care provider. Document Released: 04/21/2010 Document Revised: 11/29/2015 Document Reviewed: 11/29/2015 Elsevier Interactive Patient Education  2017 [employer]  

## 2016-06-06 NOTE — Progress Notes (Signed)
Pt called c/oing pain in ribs.  In the process of doing 24 hr urine.  Tingling in fingers

## 2016-06-06 NOTE — MAU Note (Signed)
Urine in lab 

## 2016-06-07 ENCOUNTER — Other Ambulatory Visit: Payer: Medicaid Other

## 2016-06-07 LAB — CULTURE, OB URINE
Colony Count: NO GROWTH
Organism ID, Bacteria: NO GROWTH

## 2016-06-07 NOTE — Progress Notes (Signed)
Pt here for BP check after being evaluated in MAU yesterday.  BP today is 131/80 P-102.  Recheck of BP after 15 min 125/75  P-97.  Pt does continue to c/o some RUQ pain. But states that it is some better.  She is to keep her appt tomorrow with the midwife and will call before if needed.

## 2016-06-08 ENCOUNTER — Telehealth: Payer: Self-pay | Admitting: Advanced Practice Midwife

## 2016-06-08 ENCOUNTER — Ambulatory Visit (INDEPENDENT_AMBULATORY_CARE_PROVIDER_SITE_OTHER): Payer: Medicaid Other | Admitting: Advanced Practice Midwife

## 2016-06-08 VITALS — BP 135/91 | HR 85 | Wt 188.0 lb

## 2016-06-08 DIAGNOSIS — O4703 False labor before 37 completed weeks of gestation, third trimester: Secondary | ICD-10-CM

## 2016-06-08 DIAGNOSIS — O133 Gestational [pregnancy-induced] hypertension without significant proteinuria, third trimester: Secondary | ICD-10-CM

## 2016-06-08 DIAGNOSIS — O139 Gestational [pregnancy-induced] hypertension without significant proteinuria, unspecified trimester: Secondary | ICD-10-CM | POA: Insufficient documentation

## 2016-06-08 DIAGNOSIS — Z3A32 32 weeks gestation of pregnancy: Secondary | ICD-10-CM

## 2016-06-08 DIAGNOSIS — O09299 Supervision of pregnancy with other poor reproductive or obstetric history, unspecified trimester: Secondary | ICD-10-CM

## 2016-06-08 DIAGNOSIS — O09293 Supervision of pregnancy with other poor reproductive or obstetric history, third trimester: Secondary | ICD-10-CM

## 2016-06-08 DIAGNOSIS — O0990 Supervision of high risk pregnancy, unspecified, unspecified trimester: Secondary | ICD-10-CM

## 2016-06-08 LAB — FETAL FIBRONECTIN: Fetal Fibronectin: NEGATIVE

## 2016-06-08 NOTE — Telephone Encounter (Signed)
Informed pt of neg fFN. F/u as scheduled. PTL precautions.

## 2016-06-08 NOTE — Telephone Encounter (Signed)
Called to notify pt of negative fFN. Left VM to call Dorathy KinsmanVirginia Caylan Schifano, CNM w/ results.

## 2016-06-08 NOTE — Progress Notes (Signed)
   PRENATAL VISIT NOTE  Subjective:  Rebekah Smith is a 31 y.o. [phone] at 6621w6d being seen today for ongoing prenatal care.  She is currently monitored for the following issues for this high-risk pregnancy and has History of postpartum hemorrhage; History of preeclampsia in prior pregnancy, currently pregnant; Supervision of normal pregnancy; and Kell isoimmunization during pregnancy on her problem list.  Patient reports occasional contractions and and increased pelvic pressure.  Denies HA, vision changes or epigastric pain. Taking Labetalol 200 mg BID as directed. Has not taken am dose yet because she needs to take it on a full stomach and usually takes it around 10:00 am.   Contractions: Not present. Vag. Bleeding: None.  Movement: Present. Denies leaking of fluid.   The following portions of the patient's history were reviewed and updated as appropriate: allergies, current medications, past family history, past medical history, past social history, past surgical history and problem list. Problem list updated.  Objective:   Vitals:   06/08/16 0825  BP: (!) 135/91  Pulse: 85  Weight: 188 lb (85.3 kg)    Fetal Status: Fetal Heart Rate (bpm): 144 Fundal Height: 31 cm Movement: Present    NST reactive General:  Alert, oriented and cooperative. Patient is in no acute distress.  Skin: Skin is warm and dry. No rash noted.   Cardiovascular: Normal heart rate noted  Respiratory: Normal respiratory effort, no problems with respiration noted  Abdomen: Soft, gravid, appropriate for gestational age. Pain/Pressure: Absent     Pelvic:  Cervical exam performed      FT/long/ballotable  Extremities: Normal range of motion.  Edema: Trace  Mental Status: Normal mood and affect. Normal behavior. Normal judgment and thought content.   Assessment and Plan:  Pregnancy: [id number] at 5321w6d  1. Encounter for supervision of other high risk pregnancy in third trimester   2. History of preeclampsia in  prior pregnancy, currently pregnant  - US MFM OB FOLLOW UP; Future - US MFM FETAL BPP WO NON STRESS; Future  3. Preterm contractions - WET PREP FOR TRICH, YEAST, CLUE - Fetal fibronectin-STAT--Will call pt this afternoon w/ results. Already had course of BMZ.   Preterm labor symptoms and general obstetric precautions including but not limited to vaginal bleeding, contractions, leaking of fluid and fetal movement were reviewed in detail with the patient. Please refer to After Visit Summary for other counseling recommendations.  F/U in 3-4 days for BPP and 1 week for ROB. Will do weekly BPPs until 32 weeks. Pre-E precautions. Lengthy discussion w/ pt about Antenatal testing for GHTN, Sx of Pre-E and indications for delivery. Pt had PTD at 33 weeks w/ previous baby 2/2 Pre-E. Very worried about it happening again.  Dorathy KinsmanVirginia Juletta Berhe, CNM

## 2016-06-09 LAB — WET PREP FOR TRICH, YEAST, CLUE: Trich, Wet Prep: NONE SEEN

## 2016-06-10 ENCOUNTER — Other Ambulatory Visit: Payer: Self-pay | Admitting: Advanced Practice Midwife

## 2016-06-10 DIAGNOSIS — B373 Candidiasis of vulva and vagina: Secondary | ICD-10-CM

## 2016-06-10 DIAGNOSIS — B9689 Other specified bacterial agents as the cause of diseases classified elsewhere: Secondary | ICD-10-CM

## 2016-06-10 DIAGNOSIS — N760 Acute vaginitis: Principal | ICD-10-CM

## 2016-06-10 DIAGNOSIS — O26893 Other specified pregnancy related conditions, third trimester: Secondary | ICD-10-CM

## 2016-06-10 DIAGNOSIS — N898 Other specified noninflammatory disorders of vagina: Secondary | ICD-10-CM

## 2016-06-10 DIAGNOSIS — B3731 Acute candidiasis of vulva and vagina: Secondary | ICD-10-CM

## 2016-06-10 MED ORDER — TERCONAZOLE 0.4 % VA CREA: 1.0000 | TOPICAL_CREAM | Freq: Every day | VAGINAL | 0 refills | Status: DC

## 2016-06-10 MED ORDER — METRONIDAZOLE 500 MG PO TABS: 500.0000 mg | ORAL_TABLET | Freq: Two times a day (BID) | ORAL | 0 refills | Status: DC

## 2016-06-10 NOTE — Progress Notes (Signed)
Wet pos yeast and BV. Rx Terazol and Flagyl sent to pharmacy.

## 2016-06-11 ENCOUNTER — Telehealth: Payer: Self-pay | Admitting: *Deleted

## 2016-06-11 NOTE — Telephone Encounter (Signed)
LM on voicemail of positive BV and yeast.  RX was sent to her pharmacy by Dorathy KinsmanVirginia Smith CNM.

## 2016-06-12 ENCOUNTER — Ambulatory Visit (HOSPITAL_COMMUNITY)
Admission: RE | Admit: 2016-06-12 | Discharge: 2016-06-12 | Disposition: A | Discharge status: Home / Self Care | Payer: Medicaid Other | Source: Ambulatory Visit | Attending: Advanced Practice Midwife | Admitting: Advanced Practice Midwife

## 2016-06-12 ENCOUNTER — Encounter (HOSPITAL_COMMUNITY): Payer: Self-pay

## 2016-06-12 DIAGNOSIS — O0990 Supervision of high risk pregnancy, unspecified, unspecified trimester: Secondary | ICD-10-CM

## 2016-06-12 DIAGNOSIS — Z3A32 32 weeks gestation of pregnancy: Secondary | ICD-10-CM

## 2016-06-12 DIAGNOSIS — O09299 Supervision of pregnancy with other poor reproductive or obstetric history, unspecified trimester: Secondary | ICD-10-CM

## 2016-06-12 DIAGNOSIS — O09293 Supervision of pregnancy with other poor reproductive or obstetric history, third trimester: Secondary | ICD-10-CM | POA: Insufficient documentation

## 2016-06-12 DIAGNOSIS — O361930 Maternal care for other isoimmunization, third trimester, not applicable or unspecified: Secondary | ICD-10-CM | POA: Diagnosis not present

## 2016-06-12 DIAGNOSIS — O133 Gestational [pregnancy-induced] hypertension without significant proteinuria, third trimester: Secondary | ICD-10-CM

## 2016-06-12 DIAGNOSIS — Z3A30 30 weeks gestation of pregnancy: Secondary | ICD-10-CM | POA: Diagnosis not present

## 2016-06-14 ENCOUNTER — Ambulatory Visit (INDEPENDENT_AMBULATORY_CARE_PROVIDER_SITE_OTHER): Payer: Medicaid Other | Admitting: Obstetrics & Gynecology

## 2016-06-14 VITALS — BP 126/83 | HR 113 | Wt 187.0 lb

## 2016-06-14 DIAGNOSIS — O361930 Maternal care for other isoimmunization, third trimester, not applicable or unspecified: Secondary | ICD-10-CM

## 2016-06-14 DIAGNOSIS — O09293 Supervision of pregnancy with other poor reproductive or obstetric history, third trimester: Secondary | ICD-10-CM

## 2016-06-14 DIAGNOSIS — O09299 Supervision of pregnancy with other poor reproductive or obstetric history, unspecified trimester: Secondary | ICD-10-CM

## 2016-06-14 DIAGNOSIS — O0990 Supervision of high risk pregnancy, unspecified, unspecified trimester: Secondary | ICD-10-CM

## 2016-06-14 DIAGNOSIS — O133 Gestational [pregnancy-induced] hypertension without significant proteinuria, third trimester: Secondary | ICD-10-CM

## 2016-06-14 NOTE — Progress Notes (Signed)
   PRENATAL VISIT NOTE  Subjective:  Rebekah Smith is a 31 y.o. [phone] at 2671w5d being seen today for ongoing prenatal care.  She is currently monitored for the following issues for this high-risk pregnancy and has History of postpartum hemorrhage; History of preeclampsia in prior pregnancy, currently pregnant; Supervision of high risk pregnancy, antepartum; Kell isoimmunization during pregnancy; and Gestational hypertension without significant proteinuria on her problem list.  Patient reports no complaints.  Contractions: Irritability. Vag. Bleeding: None.  Movement: Present. Denies leaking of fluid.   The following portions of the patient's history were reviewed and updated as appropriate: allergies, current medications, past family history, past medical history, past social history, past surgical history and problem list. Problem list updated.  Objective:   Vitals:   06/14/16 1326  BP: 126/83  Pulse: (!) 113  Weight: 187 lb (84.8 kg)    Fetal Status: Fetal Heart Rate (bpm): 134   Movement: Present     General:  Alert, oriented and cooperative. Patient is in no acute distress.  Skin: Skin is warm and dry. No rash noted.   Cardiovascular: Normal heart rate noted  Respiratory: Normal respiratory effort, no problems with respiration noted  Abdomen: Soft, gravid, appropriate for gestational age. Pain/Pressure: Present     Pelvic:  Cervical exam deferred        Extremities: Normal range of motion.  Edema: Trace  Mental Status: Normal mood and affect. Normal behavior. Normal judgment and thought content.   Assessment and Plan:  Pregnancy: [id number] at 3071w5d  1. Gestational hypertension without significant proteinuria in third trimester   2. History of preeclampsia in prior pregnancy, currently pregnant   3. Supervision of high risk pregnancy, antepartum   4. Kell isoimmunization during pregnancy in third trimester, single or unspecified fetus  Preterm labor symptoms and  general obstetric precautions including but not limited to vaginal bleeding, contractions, leaking of fluid and fetal movement were reviewed in detail with the patient. Please refer to After Visit Summary for other counseling recommendations.  No Follow-up on file.   Allie BossierMyra C Imani Fiebelkorn, MD

## 2016-06-17 ENCOUNTER — Encounter (HOSPITAL_COMMUNITY): Payer: Self-pay

## 2016-06-17 ENCOUNTER — Inpatient Hospital Stay (HOSPITAL_COMMUNITY)
Admission: AD | Admit: 2016-06-17 | Discharge: 2016-06-17 | Disposition: A | Discharge status: Home / Self Care | Payer: Medicaid Other | Source: Ambulatory Visit | Attending: Family Medicine | Admitting: Family Medicine

## 2016-06-17 DIAGNOSIS — Z818 Family history of other mental and behavioral disorders: Secondary | ICD-10-CM | POA: Insufficient documentation

## 2016-06-17 DIAGNOSIS — O163 Unspecified maternal hypertension, third trimester: Secondary | ICD-10-CM | POA: Diagnosis not present

## 2016-06-17 DIAGNOSIS — O26853 Spotting complicating pregnancy, third trimester: Secondary | ICD-10-CM

## 2016-06-17 DIAGNOSIS — B9689 Other specified bacterial agents as the cause of diseases classified elsewhere: Secondary | ICD-10-CM | POA: Insufficient documentation

## 2016-06-17 DIAGNOSIS — Z823 Family history of stroke: Secondary | ICD-10-CM | POA: Insufficient documentation

## 2016-06-17 DIAGNOSIS — Z8249 Family history of ischemic heart disease and other diseases of the circulatory system: Secondary | ICD-10-CM | POA: Insufficient documentation

## 2016-06-17 DIAGNOSIS — Z803 Family history of malignant neoplasm of breast: Secondary | ICD-10-CM | POA: Insufficient documentation

## 2016-06-17 DIAGNOSIS — D649 Anemia, unspecified: Secondary | ICD-10-CM | POA: Diagnosis not present

## 2016-06-17 DIAGNOSIS — Z7982 Long term (current) use of aspirin: Secondary | ICD-10-CM | POA: Diagnosis not present

## 2016-06-17 DIAGNOSIS — O99013 Anemia complicating pregnancy, third trimester: Secondary | ICD-10-CM | POA: Diagnosis not present

## 2016-06-17 DIAGNOSIS — O09299 Supervision of pregnancy with other poor reproductive or obstetric history, unspecified trimester: Secondary | ICD-10-CM

## 2016-06-17 DIAGNOSIS — Z3A31 31 weeks gestation of pregnancy: Secondary | ICD-10-CM | POA: Insufficient documentation

## 2016-06-17 DIAGNOSIS — N760 Acute vaginitis: Secondary | ICD-10-CM | POA: Insufficient documentation

## 2016-06-17 DIAGNOSIS — O26893 Other specified pregnancy related conditions, third trimester: Secondary | ICD-10-CM | POA: Insufficient documentation

## 2016-06-17 DIAGNOSIS — Z8049 Family history of malignant neoplasm of other genital organs: Secondary | ICD-10-CM | POA: Insufficient documentation

## 2016-06-17 DIAGNOSIS — O133 Gestational [pregnancy-induced] hypertension without significant proteinuria, third trimester: Secondary | ICD-10-CM

## 2016-06-17 DIAGNOSIS — O0990 Supervision of high risk pregnancy, unspecified, unspecified trimester: Secondary | ICD-10-CM

## 2016-06-17 LAB — URINALYSIS, ROUTINE W REFLEX MICROSCOPIC
Bilirubin Urine: NEGATIVE
GLUCOSE, UA: NEGATIVE mg/dL
HGB URINE DIPSTICK: NEGATIVE
KETONES UR: NEGATIVE mg/dL
NITRITE: NEGATIVE
Protein, ur: NEGATIVE mg/dL
Specific Gravity, Urine: 1.019 (ref 1.005–1.030)
pH: 6 (ref 5.0–8.0)

## 2016-06-17 MED ORDER — OXYCODONE-ACETAMINOPHEN 5-325 MG PO TABS
1.0000 | ORAL_TABLET | Freq: Once | ORAL | Status: AC
  Administered 2016-06-17: 1 via ORAL
  Filled 2016-06-17: qty 1

## 2016-06-17 NOTE — MAU Provider Note (Signed)
Chief Complaint:  Vaginal Discharge and Headache   First Provider Initiated Contact with Patient 06/17/16 1800     HPI: Rebekah Smith is a 31 y.o. [id number] at 59w1dwho presents to maternity admissions reporting Pink vaginal discharge today.  Does not think she has been having contractions. Was seen a week and a half ago for irreg UCs and FFn was Negative.  Had gotten Betamethasone in January for hypertension which was nearly severe, but later improved.  .Just finishing treatment for BV   States feels some pressure at times.  She reports good fetal movement, denies LOF, vaginal itching/burning, urinary symptoms, h/a, dizziness, n/v, diarrhea, constipation or fever/chills.    Vaginal Discharge  The patient's primary symptoms include vaginal bleeding and vaginal discharge. The patient's pertinent negatives include no genital itching, genital lesions, genital odor or pelvic pain. This is a new problem. The current episode started today. The problem occurs intermittently. The patient is experiencing no pain. She is pregnant. Associated symptoms include headaches. Pertinent negatives include no abdominal pain, back pain, chills, constipation, diarrhea, dysuria, fever, nausea, sore throat or vomiting. The vaginal discharge was bloody (Pink/tan). The vaginal bleeding is spotting. She has not been passing clots. She has not been passing tissue. Nothing aggravates the symptoms. She has tried nothing for the symptoms.  Headache   This is a new problem. The current episode started today. The problem occurs constantly. The problem has been unchanged. The pain is located in the bilateral and frontal region. The pain does not radiate. The quality of the pain is described as aching and dull. Pertinent negatives include no abdominal pain, back pain, blurred vision, fever, nausea, photophobia, seizures, sinus pressure, sore throat, visual change or vomiting. Nothing aggravates the symptoms. She has tried nothing for the  symptoms.   RN Note: Pt presents to MAU with "pinkish vaginal discharge" when wiping and a headache. Took tylenol around 1300, did not have relief. Denies leaking. Reports good fetal movement.   Past Medical History: Past Medical History:  Diagnosis Date  . Abnormal Pap smear of cervix    colposcopy  . Anemia   . Blood transfusion   . Hemorrhage   . History of preeclampsia    Had in two last pregnancies, denies chronic hypertension as she is normotensive when not pregnant  . Pregnancy induced hypertension     Past obstetric history: OB History  Gravida Para Term Preterm AB Living  5 3 2 1 1 3   SAB TAB Ectopic Multiple Live Births  1     0 3    # Outcome Date GA Lbr Len/2nd Weight Sex Delivery Anes PTL Lv  5 Current           4 Preterm 04/30/14 [redacted]w[redacted]d / 00:14 6 lb 2.1 oz (2.781 kg) M Vag-Spont EPI  LIV     Birth Comments: within normal limits  3 SAB 06/09/13          2 Term 09/01/10 [redacted]w[redacted]d  9 lb 9 oz (4.338 kg) M Vag-Spont EPI N LIV     Birth Comments: PPH  1 Term 12/03/07 [redacted]w[redacted]d  8 lb 9 oz (3.884 kg) M Vag-Spont EPI N LIV      Past Surgical History: Past Surgical History:  Procedure Laterality Date  . TONSILLECTOMY    . TONSILLECTOMY      Family History: Family History  Problem Relation Age of Onset  . Anxiety disorder Mother   . Hypertension Mother   . Heart murmur Mother   .  Breast cancer Maternal Aunt   . Breast cancer Paternal Grandmother   . Cervical cancer Maternal Aunt   . Stroke Maternal Grandmother     Social History: Social History  Substance Use Topics  . Smoking status: Never Smoker  . Smokeless tobacco: Never Used  . Alcohol use No    Allergies: No Known Allergies  Meds:  Prescriptions Prior to Admission  Medication Sig Dispense Refill Last Dose  . acetaminophen (TYLENOL) 325 MG tablet Take 325 mg by mouth every 6 (six) hours as needed.    Taking  . aspirin EC 81 MG tablet Take 1 tablet (81 mg total) by mouth daily. Take after 12 weeks  for prevention of preeclampsia later in pregnancy 300 tablet 2 Taking  . labetalol (NORMODYNE) 200 MG tablet Take 1 tablet (200 mg total) by mouth 2 (two) times daily. 60 tablet 3 Taking  . Prenatal Multivit-Min-Fe-FA (PRENATAL VITAMINS PO) Take 1 tablet by mouth daily.    Taking    I have reviewed patient's Past Medical Hx, Surgical Hx, Family Hx, Social Hx, medications and allergies.   ROS:  Review of Systems  Constitutional: Negative for chills and fever.  HENT: Negative for sinus pressure and sore throat.   Eyes: Negative for blurred vision and photophobia.  Gastrointestinal: Negative for abdominal pain, constipation, diarrhea, nausea and vomiting.  Genitourinary: Positive for vaginal discharge. Negative for dysuria and pelvic pain.  Musculoskeletal: Negative for back pain.  Neurological: Positive for headaches. Negative for seizures.   Other systems negative  Physical Exam  Patient Vitals for the past 24 hrs:  BP Temp Pulse Resp  06/17/16 1748 126/73 97.6 F (36.4 C) 88 18   Constitutional: Well-developed, well-nourished female in no acute distress.  Cardiovascular: normal rate and rhythm Respiratory: normal effort, clear to auscultation bilaterally GI: Abd soft, non-tender, gravid appropriate for gestational age.   No rebound or guarding. MS: Extremities nontender, no edema, normal ROM Neurologic: Alert and oriented x 4.  GU: Neg CVAT.  PELVIC EXAM: Cervix pink, visually closed, without lesion, small amount of pink thick mucous, vaginal walls and external genitalia normal Dilation: Fingertip Effacement (%): 30 Station: Ballotable Presentation: Vertex Exam by:: Artelia LarocheM. Raeanna Soberanes CNM     FHT:  Baseline 140 , moderate variability, accelerations present, no decelerations Contractions: occasional, ?irritability (one 10 second cramp every 3-8 min)  Patient denies pain or cramping   Labs: No results found for this or any previous visit (from the past 24 hour(s)). O/POS/--  (10/10 1018) Had a NEGATIVE FFn on 06/08/16 Results for orders placed or performed during the hospital encounter of 06/17/16 (from the past 24 hour(s))  Urinalysis, Routine w reflex microscopic     Status: Abnormal   Collection Time: 06/17/16  5:35 PM  Result Value Ref Range   Color, Urine YELLOW YELLOW   APPearance HAZY (A) CLEAR   Specific Gravity, Urine 1.019 1.005 - 1.030   pH 6.0 5.0 - 8.0   Glucose, UA NEGATIVE NEGATIVE mg/dL   Hgb urine dipstick NEGATIVE NEGATIVE   Bilirubin Urine NEGATIVE NEGATIVE   Ketones, ur NEGATIVE NEGATIVE mg/dL   Protein, ur NEGATIVE NEGATIVE mg/dL   Nitrite NEGATIVE NEGATIVE   Leukocytes, UA TRACE (A) NEGATIVE   RBC / HPF 0-5 0 - 5 RBC/hpf   WBC, UA 0-5 0 - 5 WBC/hpf   Bacteria, UA FEW (A) NONE SEEN   Squamous Epithelial / LPF 0-5 (A) NONE SEEN   Mucous PRESENT    Ca Oxalate Crys, UA PRESENT  Imaging:   MAU Course/MDM: NST reviewed  FHR reactive. Consult Dr Emelda Fear with presentation, exam findings and test results.  He recommends pt go home and monitor for PTL and come back to office in 48 hrs for recheck.   I told her if she has any cramps, increased red color to discharge, or contractions, to come back here.   Assessment: Single intrauterine Pregnancy at [redacted]w[redacted]d  Pink vaginal discharge Recent negative fetal fibronectin Resolving bacterial vaginosis  Plan: Discharge home Strict Preterm Labor precautions and fetal kick counts Follow up in Office for prenatal visits and recheck of cervix/vaginal discharge  Encouraged to return here or to other Urgent Care/ED if she develops worsening of symptoms, increase in pain, fever, or other concerning symptoms.   Pt stable at time of discharge.  Wynelle Bourgeois CNM, MSN Certified Nurse-Midwife 06/17/2016 6:00 PM

## 2016-06-17 NOTE — Discharge Instructions (Signed)

## 2016-06-17 NOTE — MAU Note (Signed)
Pt presents to MAU with "pinkish vaginal discharge" when wiping and a headache. Took tylenol around 1300, did not have relief. Denies leaking. Reports good fetal movement.

## 2016-06-21 ENCOUNTER — Ambulatory Visit (INDEPENDENT_AMBULATORY_CARE_PROVIDER_SITE_OTHER): Payer: Medicaid Other | Admitting: Obstetrics & Gynecology

## 2016-06-21 VITALS — BP 126/77 | HR 100 | Wt 189.0 lb

## 2016-06-21 DIAGNOSIS — O133 Gestational [pregnancy-induced] hypertension without significant proteinuria, third trimester: Secondary | ICD-10-CM | POA: Diagnosis not present

## 2016-06-21 DIAGNOSIS — I10 Essential (primary) hypertension: Secondary | ICD-10-CM

## 2016-06-21 NOTE — Progress Notes (Signed)
Rt upper rib cage discomfort    PRENATAL VISIT NOTE  Subjective:  Rebekah Smith is a 31 y.o. [phone] at 652w5d being seen today for ongoing prenatal care.  She is currently monitored for the following issues for this high-risk pregnancy and has History of postpartum hemorrhage; History of preeclampsia in prior pregnancy, currently pregnant; Supervision of high risk pregnancy, antepartum; Kell isoimmunization during pregnancy; and Gestational hypertension without significant proteinuria on her problem list.  Patient reports some headaches and occasional contractions.  Contractions: Irritability. Vag. Bleeding: None, Scant.  Movement: Present. Denies leaking of fluid.   The following portions of the patient's history were reviewed and updated as appropriate: allergies, current medications, past family history, past medical history, past social history, past surgical history and problem list. Problem list updated.  Objective:   Vitals:   06/21/16 1320  BP: 126/77  Pulse: 100  Weight: 189 lb (85.7 kg)    Fetal Status: Fetal Heart Rate (bpm): 142   Movement: Present     General:  Alert, oriented and cooperative. Patient is in no acute distress.  Skin: Skin is warm and dry. No rash noted.   Cardiovascular: Normal heart rate noted  Respiratory: Normal respiratory effort, no problems with respiration noted  Abdomen: Soft, gravid, appropriate for gestational age. Pain/Pressure: Present     Pelvic:  Cervical exam deferred        Extremities: Normal range of motion.  Edema: None  Mental Status: Normal mood and affect. Normal behavior. Normal judgment and thought content.   Assessment and Plan:  Pregnancy: [id number] at 5452w5d  1. Gestational Hypertension w/o proteinuria Pt says BPis higher at night 130s/90.  + headaches sometimes.  Not severe.  BP nml today - CBC - Comprehensive metabolic panel - Protein / creatinine ratio, urine - Twice weekly testing--NST REAVTIVE - Growth US with  MFM  2.  Threatened PTL 1-Cervix 1.5 cm long; vertex felt. 2-RTC one week for cervical check and FFN  Preterm labor symptoms and general obstetric precautions including but not limited to vaginal bleeding, contractions, leaking of fluid and fetal movement were reviewed in detail with the patient. Please refer to After Visit Summary for other counseling recommendations.  Return in about 1 week (around 06/28/2016).   Lesly DukesKelly H Trena Dunavan, MD

## 2016-06-22 ENCOUNTER — Inpatient Hospital Stay (HOSPITAL_COMMUNITY)
Admission: AD | Admit: 2016-06-22 | Discharge: 2016-06-22 | Disposition: A | Discharge status: Home / Self Care | Payer: Medicaid Other | Source: Ambulatory Visit | Attending: Obstetrics & Gynecology | Admitting: Obstetrics & Gynecology

## 2016-06-22 ENCOUNTER — Inpatient Hospital Stay (HOSPITAL_COMMUNITY): Payer: Medicaid Other

## 2016-06-22 ENCOUNTER — Encounter (HOSPITAL_COMMUNITY): Payer: Self-pay | Admitting: *Deleted

## 2016-06-22 ENCOUNTER — Inpatient Hospital Stay (HOSPITAL_BASED_OUTPATIENT_CLINIC_OR_DEPARTMENT_OTHER)
Admit: 2016-06-22 | Discharge: 2016-06-22 | Disposition: A | Discharge status: Home / Self Care | Payer: Medicaid Other | Attending: Family Medicine | Admitting: Family Medicine

## 2016-06-22 DIAGNOSIS — R0781 Pleurodynia: Secondary | ICD-10-CM | POA: Insufficient documentation

## 2016-06-22 DIAGNOSIS — Z7982 Long term (current) use of aspirin: Secondary | ICD-10-CM | POA: Diagnosis not present

## 2016-06-22 DIAGNOSIS — M79609 Pain in unspecified limb: Secondary | ICD-10-CM | POA: Diagnosis not present

## 2016-06-22 DIAGNOSIS — Z3A31 31 weeks gestation of pregnancy: Secondary | ICD-10-CM | POA: Diagnosis not present

## 2016-06-22 DIAGNOSIS — R1011 Right upper quadrant pain: Secondary | ICD-10-CM | POA: Diagnosis not present

## 2016-06-22 DIAGNOSIS — O9989 Other specified diseases and conditions complicating pregnancy, childbirth and the puerperium: Secondary | ICD-10-CM

## 2016-06-22 DIAGNOSIS — M79661 Pain in right lower leg: Secondary | ICD-10-CM | POA: Insufficient documentation

## 2016-06-22 DIAGNOSIS — M79604 Pain in right leg: Secondary | ICD-10-CM

## 2016-06-22 DIAGNOSIS — Z86718 Personal history of other venous thrombosis and embolism: Secondary | ICD-10-CM | POA: Insufficient documentation

## 2016-06-22 DIAGNOSIS — O26893 Other specified pregnancy related conditions, third trimester: Secondary | ICD-10-CM | POA: Diagnosis not present

## 2016-06-22 LAB — CBC
HEMATOCRIT: 35.4 % (ref 35.0–45.0)
Hemoglobin: 12 g/dL (ref 11.7–15.5)
MCH: 30.6 pg (ref 27.0–33.0)
MCHC: 33.9 g/dL (ref 32.0–36.0)
MCV: 90.3 fL (ref 80.0–100.0)
MPV: 11.3 fL (ref 7.5–12.5)
Platelets: 151 10*3/uL (ref 140–400)
RBC: 3.92 MIL/uL (ref 3.80–5.10)
RDW: 13.6 % (ref 11.0–15.0)
WBC: 7.2 10*3/uL (ref 3.8–10.8)

## 2016-06-22 LAB — URINALYSIS, ROUTINE W REFLEX MICROSCOPIC
BILIRUBIN URINE: NEGATIVE
Glucose, UA: NEGATIVE mg/dL
Hgb urine dipstick: NEGATIVE
KETONES UR: NEGATIVE mg/dL
LEUKOCYTES UA: NEGATIVE
Nitrite: NEGATIVE
PH: 6 (ref 5.0–8.0)
PROTEIN: NEGATIVE mg/dL
SPECIFIC GRAVITY, URINE: 1.017 (ref 1.005–1.030)

## 2016-06-22 LAB — COMPREHENSIVE METABOLIC PANEL
ALK PHOS: 65 U/L (ref 33–115)
ALT: 5 U/L — ABNORMAL LOW (ref 6–29)
AST: 12 U/L (ref 10–30)
Albumin: 3.3 g/dL — ABNORMAL LOW (ref 3.6–5.1)
BILIRUBIN TOTAL: 0.6 mg/dL (ref 0.2–1.2)
BUN: 10 mg/dL (ref 7–25)
CALCIUM: 8.2 mg/dL — AB (ref 8.6–10.2)
CO2: 21 mmol/L (ref 20–31)
Chloride: 106 mmol/L (ref 98–110)
Creat: 0.56 mg/dL (ref 0.50–1.10)
GLUCOSE: 128 mg/dL — AB (ref 65–99)
Potassium: 3.4 mmol/L — ABNORMAL LOW (ref 3.5–5.3)
SODIUM: 137 mmol/L (ref 135–146)
Total Protein: 5.5 g/dL — ABNORMAL LOW (ref 6.1–8.1)

## 2016-06-22 LAB — PROTEIN / CREATININE RATIO, URINE
Creatinine, Urine: 127 mg/dL (ref 20–320)
PROTEIN CREATININE RATIO: 252 mg/g{creat} — AB (ref 21–161)
Total Protein, Urine: 32 mg/dL — ABNORMAL HIGH (ref 5–24)

## 2016-06-22 MED ORDER — FAMOTIDINE 20 MG PO TABS: 20.0000 mg | ORAL_TABLET | Freq: Two times a day (BID) | ORAL | 0 refills | Status: DC

## 2016-06-22 MED ORDER — FAMOTIDINE 20 MG PO TABS
20.0000 mg | ORAL_TABLET | Freq: Once | ORAL | Status: AC
  Administered 2016-06-22: 20 mg via ORAL
  Filled 2016-06-22: qty 1

## 2016-06-22 NOTE — Progress Notes (Signed)
*  PRELIMINARY RESULTS* Vascular Ultrasound Right lower extremity venous duplex has been completed.  Preliminary findings: No evidence of DVT or baker's cyst.  Farrel DemarkJill Eunice, RDMS, RVT  06/22/2016, 11:47 AM

## 2016-06-22 NOTE — MAU Note (Signed)
Patient c/o awakening at 3am with rib and calf pain. Seen a "few weeks" ago for this pain, which has now came back more severe. Rib pain is worse with inhalation--sharp in nature and is constant. Calf pain is dull and constant. [facility] [place], and they advised to come in and be seen for questionable gallbladder. Patient last ate 2 granola bars on the way here to be seen. Denies LOF, VB, or contractions at this time. +FM

## 2016-06-22 NOTE — MAU Provider Note (Signed)
History     CSN: [id number]  Arrival date and time: 06/22/16 0840   First Provider Initiated Contact with Patient 06/22/16 [phone]      Chief Complaint  Patient presents with  . rib pain  . calf pain   Rebekah Smith is a 31 y.o [phone] at [redacted]w[redacted]d who presents with new-onset right calf pain and recurrent right-sided rib pain.  Calf pain began last night in the evening, is dull and constant, caused difficulty trying to sleep.  No edema noted.  History of blood clots in paternal and maternal grandmothers, unknown etiology.  Right-sided rib pain is located near liver/gallbladder.  Patient states pain is under ribs, sharp in quality, worse with inspiration.  Originally presented with this pain on 06/06/16.  Pain has never gone away, remained at low level until this morning at 3 am when it became significantly worse.  Diet includes mostly home-cooked foods, some fast food.  Pain is not made worse by meals.  Pain improves when lying on right side, made worse on left side.  Rib pain does not radiate.  No fever, chills, nausea, vomiting, dysuria, hematuria, back pain, diarrhea, constipation, abdominal pain.  Feels fetal movement, pressure in lower abdomen from baby.      OB History    Gravida Para Term Preterm AB Living   5 3 2 1 1 3    SAB TAB Ectopic Multiple Live Births   1     0 3      Past Medical History:  Diagnosis Date  . Abnormal Pap smear of cervix    colposcopy  . Anemia   . Blood transfusion   . Hemorrhage   . History of preeclampsia    Had in two last pregnancies, denies chronic hypertension as she is normotensive when not pregnant  . Pregnancy induced hypertension     Past Surgical History:  Procedure Laterality Date  . TONSILLECTOMY    . TONSILLECTOMY      Family History  Problem Relation Age of Onset  . Anxiety disorder Mother   . Hypertension Mother   . Heart murmur Mother   . Breast cancer Maternal Aunt   . Breast cancer Paternal Grandmother   . Cervical  cancer Maternal Aunt   . Stroke Maternal Grandmother     Social History  Substance Use Topics  . Smoking status: Never Smoker  . Smokeless tobacco: Never Used  . Alcohol use No    Allergies: No Known Allergies  Prescriptions Prior to Admission  Medication Sig Dispense Refill Last Dose  . acetaminophen (TYLENOL) 325 MG tablet Take 325 mg by mouth every 6 (six) hours as needed.    06/21/2016 at Unknown time  . aspirin EC 81 MG tablet Take 1 tablet (81 mg total) by mouth daily. Take after 12 weeks for prevention of preeclampsia later in pregnancy 300 tablet 2 06/21/2016 at Unknown time  . labetalol (NORMODYNE) 200 MG tablet Take 1 tablet (200 mg total) by mouth 2 (two) times daily. 60 tablet 3 06/21/2016 at 2300  . Prenatal Multivit-Min-Fe-FA (PRENATAL VITAMINS PO) Take 1 tablet by mouth daily.    06/21/2016 at Unknown time    Review of Systems  Constitutional: Negative for chills and fever.  Respiratory: Positive for shortness of breath (duration of pregnancy).   Cardiovascular: Negative for chest pain and leg swelling.  Gastrointestinal: Positive for abdominal pain (near gallbladder/liver). Negative for blood in stool, constipation, diarrhea, nausea and vomiting.  Genitourinary: Negative for dysuria, flank pain, hematuria,  pelvic pain, vaginal bleeding and vaginal discharge.  Musculoskeletal: Negative for back pain.  Neurological: Positive for headaches (intermittent, go away on own). Negative for dizziness and light-headedness.   Physical Exam   Blood pressure 122/81, pulse (!) 103, temperature 98 F (36.7 C), temperature source Oral, resp. rate 18, weight 85.8 kg (189 lb 1.9 oz), last menstrual period 11/12/2015, SpO2 100 %.  Physical Exam  Constitutional: She is oriented to person, place, and time. She appears well-developed and well-nourished. No distress.  HENT:  Head: Normocephalic and atraumatic.  Cardiovascular: Normal rate, regular rhythm and normal heart sounds.  Exam  reveals no gallop and no friction rub.   No murmur heard. Respiratory: Effort normal and breath sounds normal. No respiratory distress. She has no wheezes. She has no rales.  GI: Soft. Bowel sounds are normal. There is tenderness in the right upper quadrant. There is no rebound, no guarding, no CVA tenderness and negative Murphy's sign.  Musculoskeletal: She exhibits no edema.       Right lower leg: She exhibits no swelling and no edema.       Left lower leg: She exhibits no swelling and no edema.  Positive R Homan sign  Neurological: She is alert and oriented to person, place, and time.  Skin: Skin is warm and dry. No cyanosis.  Psychiatric: She has a normal mood and affect. Her behavior is normal.   Labs: Results for orders placed or performed during the hospital encounter of 06/22/16 (from the past 24 hour(s))  Urinalysis, Routine w reflex microscopic     Status: None   Collection Time: 06/22/16  8:52 AM  Result Value Ref Range   Color, Urine YELLOW YELLOW   APPearance CLEAR CLEAR   Specific Gravity, Urine 1.017 1.005 - 1.030   pH 6.0 5.0 - 8.0   Glucose, UA NEGATIVE NEGATIVE mg/dL   Hgb urine dipstick NEGATIVE NEGATIVE   Bilirubin Urine NEGATIVE NEGATIVE   Ketones, ur NEGATIVE NEGATIVE mg/dL   Protein, ur NEGATIVE NEGATIVE mg/dL   Nitrite NEGATIVE NEGATIVE   Leukocytes, UA NEGATIVE NEGATIVE   Routine prenatal labs obtained 06/21/16, relevant findings: AST 12, ALT 5, glucose 128, P/C ratio 252 (21-161 nml), Hb 12, WBC 7.2  Imaging (pulled from 3/23 US abdomen limited RUQ report):  CLINICAL DATA:  Rib pain, colicky right upper quadrant, pregnant  EXAM: US ABDOMEN LIMITED - RIGHT UPPER QUADRANT  COMPARISON:  None.  FINDINGS: Gallbladder:  No gallstones or wall thickening visualized. No sonographic Murphy sign noted by sonographer.  Common bile duct:  Diameter: 2 mm, unremarkable  Liver:  No focal lesion identified. Within normal limits in  parenchymal echogenicity.  IMPRESSION: Negative.  Normal gallbladder.   Doppler (pulled from DupoJill Eunice's 3/23 note): Right lower extremity venous duplex has been completed.  Preliminary findings: No evidence of DVT or baker's cyst.   MAU Course  Procedures: U/S abdomen limited RUQ  MDM -U/A: normal -Doppler: negative -RUQ U/S: normal  Assessment and Plan  Rib pain Calf pain  PLAN: 1. Rib pain: Rx for possible GERD 20 mg Famotidine BID as needed. 2. Calf pain: Gave DVT, PE precautions. 3. Discharge to home.  Allergies as of 06/22/2016   No Known Allergies     Medication List    TAKE these medications   acetaminophen 325 MG tablet Commonly known as:  TYLENOL Take 325 mg by mouth every 6 (six) hours as needed.   aspirin EC 81 MG tablet Take 1 tablet (81 mg total) by  mouth daily. Take after 12 weeks for prevention of preeclampsia later in pregnancy   famotidine 20 MG tablet Commonly known as:  PEPCID Take 1 tablet (20 mg total) by mouth 2 (two) times daily.   labetalol 200 MG tablet Commonly known as:  NORMODYNE Take 1 tablet (200 mg total) by mouth 2 (two) times daily.   PRENATAL VITAMINS PO Take 1 tablet by mouth daily.        Meridee Score, Medical Student 06/22/2016, 12:34 PM    MAU PROVIDER ATTESTATION I confirm that I have verified the information documented in the medical student's note and that I have also personally reperformed the physical exam and all medical decision making activities.  Doppler of RLE was negative for DVT, it is unlikely a PE given VSS, no pleurisy, no SOB with ambulation or rest, no DVT, and saturating normally on room air.  RUQ US showed no signs of gallstones.   Repeat BPs were all normal. Reviewed preE labs from yesterday, also normal.   I personally reviewed the patient's NST today, found to be REACTIVE. 135 bpm, mod var, +accels, no decels. CTX: NONE.  OK for discharge home. H2 blocker given to be taken daily.    Cleda Clarks, DO  OB Fellow 06/22/2016

## 2016-06-22 NOTE — Discharge Instructions (Signed)

## 2016-06-26 ENCOUNTER — Ambulatory Visit (HOSPITAL_COMMUNITY)
Admission: RE | Admit: 2016-06-26 | Discharge: 2016-06-26 | Disposition: A | Discharge status: Home / Self Care | Payer: Medicaid Other | Source: Ambulatory Visit | Attending: Advanced Practice Midwife | Admitting: Advanced Practice Midwife

## 2016-06-26 ENCOUNTER — Encounter (HOSPITAL_COMMUNITY): Payer: Self-pay

## 2016-06-26 ENCOUNTER — Other Ambulatory Visit (HOSPITAL_COMMUNITY): Payer: Self-pay | Admitting: *Deleted

## 2016-06-26 DIAGNOSIS — O09293 Supervision of pregnancy with other poor reproductive or obstetric history, third trimester: Secondary | ICD-10-CM | POA: Diagnosis not present

## 2016-06-26 DIAGNOSIS — Z3A32 32 weeks gestation of pregnancy: Secondary | ICD-10-CM | POA: Insufficient documentation

## 2016-06-26 DIAGNOSIS — I10 Essential (primary) hypertension: Secondary | ICD-10-CM

## 2016-06-26 DIAGNOSIS — O09299 Supervision of pregnancy with other poor reproductive or obstetric history, unspecified trimester: Secondary | ICD-10-CM

## 2016-06-26 DIAGNOSIS — O361930 Maternal care for other isoimmunization, third trimester, not applicable or unspecified: Secondary | ICD-10-CM | POA: Diagnosis not present

## 2016-06-26 DIAGNOSIS — O133 Gestational [pregnancy-induced] hypertension without significant proteinuria, third trimester: Secondary | ICD-10-CM

## 2016-06-26 DIAGNOSIS — O0990 Supervision of high risk pregnancy, unspecified, unspecified trimester: Secondary | ICD-10-CM

## 2016-06-26 NOTE — Procedures (Signed)
Rebekah Smith 1985-11-23 6082w3d  Fetus A Non-Stress Test Interpretation for 06/26/16  Indication: Unsatisfactory BPP  Fetal Heart Rate A Mode: External Baseline Rate (A): 120 bpm Variability: Moderate Accelerations: 15 x 15 Decelerations: None  Uterine Activity Mode: Toco Contraction Frequency (min): occ UC noted. Contraction Duration (sec): 40-60 Contraction Quality: Mild Resting Tone Palpated: Relaxed Resting Time: Adequate  Interpretation (Fetal Testing) Nonstress Test Interpretation: Reactive Comments: FHR tracing rev'd by Dr. Claudean SeveranceWhitecar

## 2016-06-28 ENCOUNTER — Ambulatory Visit (INDEPENDENT_AMBULATORY_CARE_PROVIDER_SITE_OTHER): Payer: Medicaid Other | Admitting: Obstetrics & Gynecology

## 2016-06-28 VITALS — BP 133/84 | HR 83 | Wt 190.0 lb

## 2016-06-28 DIAGNOSIS — O133 Gestational [pregnancy-induced] hypertension without significant proteinuria, third trimester: Secondary | ICD-10-CM | POA: Diagnosis not present

## 2016-06-28 DIAGNOSIS — O09299 Supervision of pregnancy with other poor reproductive or obstetric history, unspecified trimester: Secondary | ICD-10-CM

## 2016-06-28 DIAGNOSIS — O0990 Supervision of high risk pregnancy, unspecified, unspecified trimester: Secondary | ICD-10-CM

## 2016-06-28 DIAGNOSIS — O09293 Supervision of pregnancy with other poor reproductive or obstetric history, third trimester: Secondary | ICD-10-CM

## 2016-06-28 NOTE — Progress Notes (Signed)
   PRENATAL VISIT NOTE  Subjective:  Rebekah Smith is a 31 y.o. [phone] at 1651w5d being seen today for ongoing prenatal care.  She is currently monitored for the following issues for this high-risk pregnancy and has History of postpartum hemorrhage; History of preeclampsia in prior pregnancy, currently pregnant; Supervision of high risk pregnancy, antepartum; Kell isoimmunization during pregnancy; and Gestational hypertension without significant proteinuria on her problem list.  Patient reports feeling like the baby is very low, would like a cervical exam.  Contractions: Irritability. Vag. Bleeding: None.  Movement: Present. Denies leaking of fluid.   The following portions of the patient's history were reviewed and updated as appropriate: allergies, current medications, past family history, past medical history, past social history, past surgical history and problem list. Problem list updated.  Objective:   Vitals:   06/28/16 1305  BP: 133/84  Pulse: 83  Weight: 190 lb (86.2 kg)    Fetal Status: Fetal Heart Rate (bpm): 142   Movement: Present     General:  Alert, oriented and cooperative. Patient is in no acute distress.  Skin: Skin is warm and dry. No rash noted.   Cardiovascular: Normal heart rate noted  Respiratory: Normal respiratory effort, no problems with respiration noted  Abdomen: Soft, gravid, appropriate for gestational age. Pain/Pressure: Present     Pelvic:  Cervical exam performed        Extremities: Normal range of motion.  Edema: Trace  Mental Status: Normal mood and affect. Normal behavior. Normal judgment and thought content.   Assessment and Plan:  Pregnancy: [id number] at 6151w5d  1. Supervision of high risk pregnancy, antepartum   2. History of preeclampsia in prior pregnancy, currently pregnant - Continue baby asa daily, rec rest as much as possible  3. Gestational hypertension without significant proteinuria in third trimester   Preterm labor symptoms  and general obstetric precautions including but not limited to vaginal bleeding, contractions, leaking of fluid and fetal movement were reviewed in detail with the patient. Please refer to After Visit Summary for other counseling recommendations.  No Follow-up on file.   Allie BossierMyra C Yaret Hush, MD

## 2016-07-03 ENCOUNTER — Ambulatory Visit (INDEPENDENT_AMBULATORY_CARE_PROVIDER_SITE_OTHER): Payer: Medicaid Other | Admitting: Obstetrics & Gynecology

## 2016-07-03 ENCOUNTER — Other Ambulatory Visit: Payer: Medicaid Other

## 2016-07-03 ENCOUNTER — Ambulatory Visit (INDEPENDENT_AMBULATORY_CARE_PROVIDER_SITE_OTHER): Payer: Medicaid Other

## 2016-07-03 VITALS — BP 122/79 | HR 84 | Wt 190.0 lb

## 2016-07-03 VITALS — BP 122/79 | HR 84

## 2016-07-03 DIAGNOSIS — O133 Gestational [pregnancy-induced] hypertension without significant proteinuria, third trimester: Secondary | ICD-10-CM

## 2016-07-03 DIAGNOSIS — O361930 Maternal care for other isoimmunization, third trimester, not applicable or unspecified: Secondary | ICD-10-CM

## 2016-07-03 DIAGNOSIS — O09293 Supervision of pregnancy with other poor reproductive or obstetric history, third trimester: Secondary | ICD-10-CM

## 2016-07-03 DIAGNOSIS — O0990 Supervision of high risk pregnancy, unspecified, unspecified trimester: Secondary | ICD-10-CM

## 2016-07-03 DIAGNOSIS — O09299 Supervision of pregnancy with other poor reproductive or obstetric history, unspecified trimester: Secondary | ICD-10-CM

## 2016-07-03 NOTE — Progress Notes (Signed)
   PRENATAL VISIT NOTE  Subjective:  Rebekah Smith is a 31 y.o. [phone] at [redacted]w[redacted]d being seen today for ongoing prenatal care.  She is currently monitored for the following issues for this high-risk pregnancy and has History of postpartum hemorrhage; History of preeclampsia in prior pregnancy, currently pregnant; Supervision of high risk pregnancy, antepartum; Kell isoimmunization during pregnancy; and Gestational hypertension without significant proteinuria on her problem list.  Patient reports cramping.  Contractions: Irregular. Vag. Bleeding: None.  Movement: Present. Denies leaking of fluid.   The following portions of the patient's history were reviewed and updated as appropriate: allergies, current medications, past family history, past medical history, past social history, past surgical history and problem list. Problem list updated.  Objective:   Vitals:   07/03/16 1532  BP: 122/79  Pulse: 84  Weight: 190 lb (86.2 kg)    Fetal Status: Fetal Heart Rate (bpm): NST- R   Movement: Present     General:  Alert, oriented and cooperative. Patient is in no acute distress.  Skin: Skin is warm and dry. No rash noted.   Cardiovascular: Normal heart rate noted  Respiratory: Normal respiratory effort, no problems with respiration noted  Abdomen: Soft, gravid, appropriate for gestational age. Pain/Pressure: Present     Pelvic:  Cervical exam performed        Extremities: Normal range of motion.  Edema: Trace  Mental Status: Normal mood and affect. Normal behavior. Normal judgment and thought content.   Assessment and Plan:  Pregnancy: [id number] at [redacted]w[redacted]d  1. History of preeclampsia in prior pregnancy, currently pregnant   2. Gestational hypertension without significant proteinuria in third trimester   3. Supervision of high risk pregnancy, antepartum   Preterm labor symptoms and general obstetric precautions including but not limited to vaginal bleeding, contractions, leaking of  fluid and fetal movement were reviewed in detail with the patient. Please refer to After Visit Summary for other counseling recommendations.  No Follow-up on file.   Allie Bossier, MD

## 2016-07-06 ENCOUNTER — Other Ambulatory Visit (HOSPITAL_COMMUNITY)
Admission: RE | Admit: 2016-07-06 | Discharge: 2016-07-06 | Disposition: A | Discharge status: Home / Self Care | Payer: Medicaid Other | Source: Ambulatory Visit | Attending: Advanced Practice Midwife | Admitting: Advanced Practice Midwife

## 2016-07-06 ENCOUNTER — Ambulatory Visit (INDEPENDENT_AMBULATORY_CARE_PROVIDER_SITE_OTHER): Payer: Medicaid Other | Admitting: *Deleted

## 2016-07-06 ENCOUNTER — Ambulatory Visit (INDEPENDENT_AMBULATORY_CARE_PROVIDER_SITE_OTHER): Payer: Medicaid Other | Admitting: Advanced Practice Midwife

## 2016-07-06 VITALS — BP 108/75 | HR 85 | Wt 193.0 lb

## 2016-07-06 DIAGNOSIS — N949 Unspecified condition associated with female genital organs and menstrual cycle: Secondary | ICD-10-CM | POA: Insufficient documentation

## 2016-07-06 DIAGNOSIS — O09299 Supervision of pregnancy with other poor reproductive or obstetric history, unspecified trimester: Secondary | ICD-10-CM

## 2016-07-06 DIAGNOSIS — O26893 Other specified pregnancy related conditions, third trimester: Secondary | ICD-10-CM

## 2016-07-06 DIAGNOSIS — O133 Gestational [pregnancy-induced] hypertension without significant proteinuria, third trimester: Secondary | ICD-10-CM

## 2016-07-06 DIAGNOSIS — Z3A34 34 weeks gestation of pregnancy: Secondary | ICD-10-CM | POA: Diagnosis not present

## 2016-07-06 LAB — OB RESULTS CONSOLE GBS: STREP GROUP B AG: POSITIVE

## 2016-07-06 LAB — FETAL FIBRONECTIN: Fetal Fibronectin: NEGATIVE

## 2016-07-09 ENCOUNTER — Inpatient Hospital Stay (HOSPITAL_COMMUNITY)
Admission: AD | Admit: 2016-07-09 | Discharge: 2016-07-09 | Disposition: A | Discharge status: Home / Self Care | Payer: Medicaid Other | Source: Ambulatory Visit | Attending: Obstetrics & Gynecology | Admitting: Obstetrics & Gynecology

## 2016-07-09 ENCOUNTER — Encounter (HOSPITAL_COMMUNITY): Payer: Self-pay

## 2016-07-09 ENCOUNTER — Ambulatory Visit (INDEPENDENT_AMBULATORY_CARE_PROVIDER_SITE_OTHER): Payer: Medicaid Other | Admitting: Obstetrics & Gynecology

## 2016-07-09 VITALS — Wt 194.0 lb

## 2016-07-09 DIAGNOSIS — Z818 Family history of other mental and behavioral disorders: Secondary | ICD-10-CM | POA: Diagnosis not present

## 2016-07-09 DIAGNOSIS — O09299 Supervision of pregnancy with other poor reproductive or obstetric history, unspecified trimester: Secondary | ICD-10-CM

## 2016-07-09 DIAGNOSIS — Z7982 Long term (current) use of aspirin: Secondary | ICD-10-CM | POA: Diagnosis not present

## 2016-07-09 DIAGNOSIS — Z3A34 34 weeks gestation of pregnancy: Secondary | ICD-10-CM | POA: Insufficient documentation

## 2016-07-09 DIAGNOSIS — O133 Gestational [pregnancy-induced] hypertension without significant proteinuria, third trimester: Secondary | ICD-10-CM | POA: Diagnosis not present

## 2016-07-09 DIAGNOSIS — Z79899 Other long term (current) drug therapy: Secondary | ICD-10-CM | POA: Diagnosis not present

## 2016-07-09 DIAGNOSIS — O0990 Supervision of high risk pregnancy, unspecified, unspecified trimester: Secondary | ICD-10-CM

## 2016-07-09 DIAGNOSIS — O0993 Supervision of high risk pregnancy, unspecified, third trimester: Secondary | ICD-10-CM | POA: Diagnosis not present

## 2016-07-09 DIAGNOSIS — Z8249 Family history of ischemic heart disease and other diseases of the circulatory system: Secondary | ICD-10-CM | POA: Insufficient documentation

## 2016-07-09 DIAGNOSIS — R51 Headache: Secondary | ICD-10-CM | POA: Diagnosis present

## 2016-07-09 DIAGNOSIS — Z8759 Personal history of other complications of pregnancy, childbirth and the puerperium: Secondary | ICD-10-CM | POA: Diagnosis not present

## 2016-07-09 LAB — COMPREHENSIVE METABOLIC PANEL
ALBUMIN: 3.4 g/dL — AB (ref 3.5–5.0)
ALK PHOS: 79 U/L (ref 38–126)
ALT: 11 U/L — ABNORMAL LOW (ref 14–54)
ANION GAP: 7 (ref 5–15)
AST: 18 U/L (ref 15–41)
BILIRUBIN TOTAL: 0.8 mg/dL (ref 0.3–1.2)
BUN: 6 mg/dL (ref 6–20)
CALCIUM: 8.4 mg/dL — AB (ref 8.9–10.3)
CO2: 23 mmol/L (ref 22–32)
Chloride: 105 mmol/L (ref 101–111)
Creatinine, Ser: 0.53 mg/dL (ref 0.44–1.00)
GFR calc Af Amer: >60 mL/min (ref >60)
GFR calc non Af Amer: >60 mL/min (ref >60)
GLUCOSE: 91 mg/dL (ref 65–99)
POTASSIUM: 3.6 mmol/L (ref 3.5–5.1)
SODIUM: 135 mmol/L (ref 135–145)
TOTAL PROTEIN: 6.3 g/dL — AB (ref 6.5–8.1)

## 2016-07-09 LAB — CBC
HEMATOCRIT: 35.7 % — AB (ref 36.0–46.0)
HEMOGLOBIN: 12.5 g/dL (ref 12.0–15.0)
MCH: 31.3 pg (ref 26.0–34.0)
MCHC: 35 g/dL (ref 30.0–36.0)
MCV: 89.5 fL (ref 78.0–100.0)
Platelets: 141 10*3/uL — ABNORMAL LOW (ref 150–400)
RBC: 3.99 MIL/uL (ref 3.87–5.11)
RDW: 13.8 % (ref 11.5–15.5)
WBC: 7.3 10*3/uL (ref 4.0–10.5)

## 2016-07-09 LAB — PROTEIN / CREATININE RATIO, URINE
Creatinine, Urine: 97 mg/dL
PROTEIN CREATININE RATIO: 0.15 mg/mg{creat} (ref 0.00–0.15)
Total Protein, Urine: 15 mg/dL

## 2016-07-09 LAB — CERVICOVAGINAL ANCILLARY ONLY
CHLAMYDIA, DNA PROBE: NEGATIVE
NEISSERIA GONORRHEA: NEGATIVE

## 2016-07-09 MED ORDER — BUTALBITAL-APAP-CAFFEINE 50-325-40 MG PO TABS
1.0000 | ORAL_TABLET | Freq: Once | ORAL | Status: AC
  Administered 2016-07-09: 1 via ORAL
  Filled 2016-07-09: qty 1

## 2016-07-09 MED ORDER — BUTALBITAL-APAP-CAFFEINE 50-325-40 MG PO CAPS: 1.0000 | ORAL_CAPSULE | Freq: Four times a day (QID) | ORAL | 3 refills | Status: DC | PRN

## 2016-07-09 NOTE — Discharge Instructions (Signed)
Hypertension During Pregnancy °Hypertension, commonly called high blood pressure, is when the force of blood pumping through your arteries is too strong. Arteries are blood vessels that carry blood from the heart throughout the body. Hypertension during pregnancy can cause problems for you and your baby. Your baby may be born early (prematurely) or may not weigh as much as he or she should at birth. Very bad cases of hypertension during pregnancy can be life-threatening. °Different types of hypertension can occur during pregnancy. These include: °· Chronic hypertension. This happens when: °¨ You have hypertension before pregnancy and it continues during pregnancy. °¨ You develop hypertension before you are [redacted] weeks pregnant, and it continues during pregnancy. °· Gestational hypertension. This is hypertension that develops after the 20th week of pregnancy. °· Preeclampsia, also called toxemia of pregnancy. This is a very serious type of hypertension that develops only during pregnancy. It affects the whole body, and it can be very dangerous for you and your baby. °Gestational hypertension and preeclampsia usually go away within 6 weeks after your baby is born. Women who have hypertension during pregnancy have a greater chance of developing hypertension later in life or during future pregnancies. °What are the causes? °The exact cause of hypertension is not known. °What increases the risk? °There are certain factors that make it more likely for you to develop hypertension during pregnancy. These include: °· Having hypertension during a previous pregnancy or prior to pregnancy. °· Being overweight. °· Being older than age 40. °· Being pregnant for the first time or being pregnant with more than one baby. °· Becoming pregnant using fertilization methods such as IVF (in vitro fertilization). °· Having diabetes, kidney problems, or systemic lupus erythematosus. °· Having a family history of hypertension. °What are the  signs or symptoms? °Chronic hypertension and gestational hypertension rarely cause symptoms. Preeclampsia causes symptoms, which may include: °· Increased protein in your urine. Your health care provider will check for this at every visit before you give birth (prenatal visit). °· Severe headaches. °· Sudden weight gain. °· Swelling of the hands, face, legs, and feet. °· Nausea and vomiting. °· Vision problems, such as blurred or double vision. °· Numbness in the face, arms, legs, and feet. °· Dizziness. °· Slurred speech. °· Sensitivity to bright lights. °· Abdominal pain. °· Convulsions. °How is this diagnosed? °You may be diagnosed with hypertension during a routine prenatal exam. At each prenatal visit, you may: °· Have a urine test to check for high amounts of protein in your urine. °· Have your blood pressure checked. A blood pressure reading is recorded as two numbers, such as "120 over 80" (or 120/80). The first ("top") number is called the systolic pressure. It is a measure of the pressure in your arteries when your heart beats. The second ("bottom") number is called the diastolic pressure. It is a measure of the pressure in your arteries as your heart relaxes between beats. Blood pressure is measured in a unit called mm Hg. A normal blood pressure reading is: °¨ Systolic: below 120. °¨ Diastolic: below 80. °The type of hypertension that you are diagnosed with depends on your test results and when your symptoms developed. °· Chronic hypertension is usually diagnosed before 20 weeks of pregnancy. °· Gestational hypertension is usually diagnosed after 20 weeks of pregnancy. °· Hypertension with high amounts of protein in the urine is diagnosed as preeclampsia. °· Blood pressure measurements that stay above 160 systolic, or above 110 diastolic, are signs of severe preeclampsia. °  How is this treated? °Treatment for hypertension during pregnancy varies depending on the type of hypertension you have and how  serious it is. °· If you take medicines called ACE inhibitors to treat chronic hypertension, you may need to switch medicines. ACE inhibitors should not be taken during pregnancy. °· If you have gestational hypertension, you may need to take blood pressure medicine. °· If you are at risk for preeclampsia, your health care provider may recommend that you take a low-dose aspirin every day to prevent high blood pressure during your pregnancy. °· If you have severe preeclampsia, you may need to be hospitalized so you and your baby can be monitored closely. You may also need to take medicine (magnesium sulfate) to prevent seizures and to lower blood pressure. This medicine may be given as an injection or through an IV tube. °· In some cases, if your condition gets worse, you may need to deliver your baby early. °Follow these instructions at home: °Eating and drinking °· Drink enough fluid to keep your urine clear or pale yellow. °· Eat a healthy diet that is low in salt (sodium). Do not add salt to your food. Check food labels to see how much sodium a food or beverage contains. °Lifestyle °· Do not use any products that contain nicotine or tobacco, such as cigarettes and e-cigarettes. If you need help quitting, ask your health care provider. °· Do not use alcohol. °· Avoid caffeine. °· Avoid stress as much as possible. Rest and get plenty of sleep. °General instructions °· Take over-the-counter and prescription medicines only as told by your health care provider. °· While lying down, lie on your left side. This keeps pressure off your baby. °· While sitting or lying down, raise (elevate) your feet. Try putting some pillows under your lower legs. °· Exercise regularly. Ask your health care provider what kinds of exercise are best for you. °· Keep all prenatal and follow-up visits as told by your health care provider. This is important. °Contact a health care provider if: °· You have symptoms that your health care provider  told you may require more treatment or monitoring, such as: °¨ Fever. °¨ Vomiting. °¨ Headache. °Get help right away if: °· You have severe abdominal pain or vomiting that does not get better with treatment. °· You suddenly develop swelling in your hands, ankles, or face. °· You gain 4 lbs (1.8 kg) or more in 1 week. °· You develop vaginal bleeding, or you have blood in your urine. °· You do not feel your baby moving as much as usual. °· You have blurred or double vision. °· You have muscle twitching or sudden tightening (spasms). °· You have shortness of breath. °· Your lips or fingernails turn blue. °This information is not intended to replace advice given to you by your health care provider. Make sure you discuss any questions you have with your health care provider. °Document Released: 12/05/2010 Document Revised: 10/07/2015 Document Reviewed: 09/02/2015 °Elsevier Interactive Patient Education © 2017 [employer] ° °

## 2016-07-09 NOTE — MAU Provider Note (Signed)
Chief Complaint:  Headache; Blurred Vision; and Hypertension   First Provider Initiated Contact with Patient 07/09/16 1731     HPI: Rebekah Smith is a 31 y.o. [id number] at 62w2dwho presents to maternity admissions reporting headache and blurred vision.  This is new.  Has had hypertension.  Feels face is swollen.  Was seen in Office today and sent here for evaluation.  Has had preterm delivery for preeclampsia in past She reports good fetal movement, denies LOF, vaginal bleeding, vaginal itching/burning, urinary symptoms, h/a, dizziness, n/v, diarrhea, constipation or fever/chills.  She denies headache, visual changes or RUQ abdominal pain.  Headache   This is a new problem. The current episode started today. The problem occurs constantly. The problem has been unchanged. The pain is located in the bilateral and frontal region. The pain does not radiate. The quality of the pain is described as aching and dull. Associated symptoms include blurred vision, photophobia and a visual change. Pertinent negatives include no abdominal pain, dizziness, fever, loss of balance, nausea, tingling, vomiting or weakness. The symptoms are aggravated by bright light. She has tried acetaminophen for the symptoms. The treatment provided no relief. Her past medical history is significant for hypertension.  Hypertension  This is a recurrent problem. The problem is unchanged. Associated symptoms include blurred vision, headaches and peripheral edema (trace). Pertinent negatives include no malaise/fatigue. Past treatments include beta blockers. The current treatment provides mild improvement. There are no compliance problems.     RN Note: G5P3 @ 34.[redacted] wksga. Presents to triage for ha, blurred vision, and HTN sent here from office. Denies lof or bleeding. +FM. EFM applied.   Past Medical History: Past Medical History:  Diagnosis Date  . Abnormal Pap smear of cervix    colposcopy  . Anemia   . Blood transfusion   .  Hemorrhage   . History of preeclampsia    Had in two last pregnancies, denies chronic hypertension as she is normotensive when not pregnant  . Pregnancy induced hypertension     Past obstetric history: OB History  Gravida Para Term Preterm AB Living  SAB TAB Ectopic Multiple Live Births  1     0 3    # Outcome Date GA Lbr Len/2nd Weight Sex Delivery Anes PTL Lv  5 Current           4 Preterm 04/30/14 [redacted]w[redacted]d / 00:14 6 lb 2.1 oz (2.781 kg) M Vag-Spont EPI  LIV     Birth Comments: within normal limits  3 SAB 06/09/13          2 Term 09/01/10 [redacted]w[redacted]d  9 lb 9 oz (4.338 kg) M Vag-Spont EPI N LIV     Birth Comments: PPH  1 Term 12/03/07 [redacted]w[redacted]d  8 lb 9 oz (3.884 kg) M Vag-Spont EPI N LIV      Past Surgical History: Past Surgical History:  Procedure Laterality Date  . TONSILLECTOMY    . TONSILLECTOMY      Family History: Family History  Problem Relation Age of Onset  . Anxiety disorder Mother   . Hypertension Mother   . Heart murmur Mother   . Breast cancer Maternal Aunt   . Breast cancer Paternal Grandmother   . Cervical cancer Maternal Aunt   . Stroke Maternal Grandmother     Social History: Social History  Substance Use Topics  . Smoking status: Never Smoker  . Smokeless tobacco: Never Used  . Alcohol use No  Allergies: No Known Allergies  Meds:  Prescriptions Prior to Admission  Medication Sig Dispense Refill Last Dose  . acetaminophen (TYLENOL) 325 MG tablet Take 325 mg by mouth every 6 (six) hours as needed.    Taking  . aspirin EC 81 MG tablet Take 1 tablet (81 mg total) by mouth daily. Take after 12 weeks for prevention of preeclampsia later in pregnancy 300 tablet 2 Taking  . famotidine (PEPCID) 20 MG tablet Take 1 tablet (20 mg total) by mouth 2 (two) times daily. 30 tablet 0 Taking  . labetalol (NORMODYNE) 200 MG tablet Take 1 tablet (200 mg total) by mouth 2 (two) times daily. 60 tablet 3 Taking  . Prenatal Multivit-Min-Fe-FA (PRENATAL  VITAMINS PO) Take 1 tablet by mouth daily.    Taking    I have reviewed patient's Past Medical Hx, Surgical Hx, Family Hx, Social Hx, medications and allergies.   ROS:  Review of Systems  Constitutional: Negative for fever and malaise/fatigue.  Eyes: Positive for blurred vision and photophobia.  Gastrointestinal: Negative for abdominal pain, nausea and vomiting.  Neurological: Positive for headaches. Negative for dizziness, tingling, weakness and loss of balance.   Other systems negative  Physical Exam   Vitals:   07/09/16 1741 07/09/16 1746 07/09/16 1747 07/09/16 1751  BP:  140/83    Pulse: 77 82 79 83  Resp:      Temp:      TempSrc:      SpO2: 99% 100%  99%     Constitutional: Well-developed, well-nourished female in no acute distress.  Cardiovascular: normal rate and rhythm Respiratory: normal effort, clear to auscultation bilaterally GI: Abd soft, non-tender, gravid appropriate for gestational age.   No rebound or guarding. MS: Extremities nontender, trace edema of feet, hands and face. normal ROM Neurologic: Alert and oriented x 4. DTRs 1+/ no clonus GU: Neg CVAT.  PELVIC EXAM:  deferred  FHT:  Baseline 145 , moderate variability, accelerations present, no decelerations Contractions:  Rare   Labs: Results for orders placed or performed during the hospital encounter of 07/09/16 (from the past 24 hour(s))  Protein / creatinine ratio, urine     Status: None   Collection Time: 07/09/16  5:11 PM  Result Value Ref Range   Creatinine, Urine 97.00 mg/dL   Total Protein, Urine 15 mg/dL   Protein Creatinine Ratio 0.15 0.00 - 0.15 mg/mg[Cre]  CBC     Status: Abnormal   Collection Time: 07/09/16  5:21 PM  Result Value Ref Range   WBC 7.3 4.0 - 10.5 K/uL   RBC 3.99 3.87 - 5.11 MIL/uL   Hemoglobin 12.5 12.0 - 15.0 g/dL   HCT 16.1 (L) 09.6 - 04.5 %   MCV 89.5 78.0 - 100.0 fL   MCH 31.3 26.0 - 34.0 pg   MCHC 35.0 30.0 - 36.0 g/dL   RDW 40.9 81.1 - 91.4 %   Platelets  141 (L) 150 - 400 K/uL  Comprehensive metabolic panel     Status: Abnormal   Collection Time: 07/09/16  5:21 PM  Result Value Ref Range   Sodium 135 135 - 145 mmol/L   Potassium 3.6 3.5 - 5.1 mmol/L   Chloride 105 101 - 111 mmol/L   CO2 23 22 - 32 mmol/L   Glucose, Bld 91 65 - 99 mg/dL   BUN 6 6 - 20 mg/dL   Creatinine, Ser 7.82 0.44 - 1.00 mg/dL   Calcium 8.4 (L) 8.9 - 10.3 mg/dL   Total Protein 6.3 (  L) 6.5 - 8.1 g/dL   Albumin 3.4 (L) 3.5 - 5.0 g/dL   AST 18 15 - 41 U/L   ALT 11 (L) 14 - 54 U/L   Alkaline Phosphatase 79 38 - 126 U/L   Total Bilirubin 0.8 0.3 - 1.2 mg/dL   GFR calc non Af Amer >60 >60 mL/min   GFR calc Af Amer >60 >60 mL/min   Anion gap 7 5 - 15    Imaging:    MAU Course/MDM: I have ordered labs and reviewed results.  NST reviewed Consult Dr Debroah Loop with presentation, exam findings and test results. He recommends discharge home with close twice weekly followup in office. Scheduled for office visit on Friday. Treatments in MAU included NST and Fioricet which effected good relief of headache.    Assessment: 1. Gestational hypertension without significant proteinuria in third trimester   2. Supervision of high risk pregnancy, antepartum   3. Hx of preeclampsia, prior pregnancy, currently pregnant     Plan: Discharge home Rx Fioricet for headache at home Preterm Labor precautions and fetal kick counts Follow up in Office for prenatal visits and recheck of BP and NST  Encouraged to return here or to other Urgent Care/ED if she develops worsening of symptoms, increase in pain, fever, or other concerning symptoms.   Pt stable at time of discharge.  Wynelle Bourgeois CNM, MSN Certified Nurse-Midwife 07/09/2016 5:31 PM

## 2016-07-09 NOTE — Progress Notes (Signed)
   PRENATAL VISIT NOTE  Subjective:  Rebekah Smith is a 31 y.o. [phone] at [redacted]w[redacted]d being seen today for ongoing prenatal care.  She is currently monitored for the following issues for this high-risk pregnancy and has History of postpartum hemorrhage; History of preeclampsia in prior pregnancy, currently pregnant; Supervision of high risk pregnancy, antepartum; Kell isoimmunization during pregnancy; and Gestational hypertension without significant proteinuria on her problem list.  Patient reports She is here for a walk in visit due to new onset head ache and visual changes (dizziness). .   .  .   . Denies leaking of fluid.   The following portions of the patient's history were reviewed and updated as appropriate: allergies, current medications, past family history, past medical history, past social history, past surgical history and problem list. Problem list updated.  Objective:  There were no vitals filed for this visit.  Fetal Status:           General:  Alert, oriented and cooperative. Patient is in no acute distress.  Skin: Skin is warm and dry. No rash noted.   Cardiovascular: Normal heart rate noted  Respiratory: Normal respiratory effort, no problems with respiration noted  Abdomen: Soft, gravid, appropriate for gestational age.       Pelvic:  Cervical exam deferred        Extremities: Normal range of motion.     Mental Status: Normal mood and affect. Normal behavior. Normal judgment and thought content.   Assessment and Plan:  Pregnancy: [id number] at [redacted]w[redacted]d  1. Gestational hypertension without significant proteinuria in third trimester -To MAU for further evaluation   2. Supervision of high risk pregnancy, antepartum   Preterm labor symptoms and general obstetric precautions including but not limited to vaginal bleeding, contractions, leaking of fluid and fetal movement were reviewed in detail with the patient. Please refer to After Visit Summary for other counseling  recommendations.  No Follow-up on file.   Allie Bossier, MD

## 2016-07-09 NOTE — Progress Notes (Addendum)
G5P3 @ 34.[redacted] wksga. Presents to triage for ha, blurred vision, and HTN sent here from office. Denies lof or bleeding. +FM. EFM applied.   See flowsheet for VS  Lab at bs  Hx of preterm del for preE with last pregn.   1743: Provider at bs assessing pt.   1830: states ha pain is improved since taking fiorcet. VSS. See flow sheet for details. Provider made aware  1915: discharge instructions given with pt understanding.   1921: Pt left unit via ambulatory.

## 2016-07-10 ENCOUNTER — Encounter: Payer: Self-pay | Admitting: Advanced Practice Midwife

## 2016-07-10 ENCOUNTER — Telehealth: Payer: Self-pay | Admitting: *Deleted

## 2016-07-10 ENCOUNTER — Other Ambulatory Visit: Payer: Medicaid Other

## 2016-07-10 ENCOUNTER — Other Ambulatory Visit: Payer: Self-pay | Admitting: Advanced Practice Midwife

## 2016-07-10 DIAGNOSIS — R8271 Bacteriuria: Secondary | ICD-10-CM

## 2016-07-10 LAB — CULTURE, OB URINE

## 2016-07-10 MED ORDER — AMOXICILLIN 875 MG PO TABS: 875.0000 mg | ORAL_TABLET | Freq: Two times a day (BID) | ORAL | 0 refills | Status: DC

## 2016-07-10 NOTE — Progress Notes (Signed)
   PRENATAL VISIT NOTE  Subjective:  Rebekah Smith is a 31 y.o. [phone] at [redacted]w[redacted]d being seen today for ongoing prenatal care.  She is currently monitored for the following issues for this high-risk pregnancy and has History of postpartum hemorrhage; History of preeclampsia in prior pregnancy, currently pregnant; Supervision of high risk pregnancy, antepartum; Kell isoimmunization during pregnancy; and Gestational hypertension without significant proteinuria on her problem list.  Patient denies HA, vision changes or epigastric pain.  Contractions: Irregular. Vag. Bleeding: None.  Movement: Present. Denies leaking of fluid.   The following portions of the patient's history were reviewed and updated as appropriate: allergies, current medications, past family history, past medical history, past social history, past surgical history and problem list. Problem list updated.  Objective:  BP 108/75   Fetal Status: Fetal Heart Rate (bpm): NST Fundal Height: 36 cm Movement: Present  Presentation: Vertex  General:  Alert, oriented and cooperative. Patient is in no acute distress.  Skin: Skin is warm and dry. No rash noted.   Cardiovascular: Normal heart rate noted  Respiratory: Normal respiratory effort, no problems with respiration noted  Abdomen: Soft, gravid, appropriate for gestational age. Pain/Pressure: Present     Pelvic:  Cervical exam performed Dilation: 1 Effacement (%): 0 Station: -3  Extremities: Normal range of motion.     Mental Status: Normal mood and affect. Normal behavior. Normal judgment and thought content.   Assessment and Plan:  Pregnancy: [id number] at [redacted]w[redacted]d  1. Pelvic pressure in pregnancy, antepartum, third trimester  - GC/Chlamydia Probe Amp - Culture, OB Urine - Fetal fibronectin - Cervicovaginal ancillary only  2. Gestational hypertension without significant proteinuria in third trimester  - Cervicovaginal ancillary only  Preterm labor symptoms and general  obstetric precautions including but not limited to vaginal bleeding, contractions, leaking of fluid and fetal movement were reviewed in detail with the patient. Please refer to After Visit Summary for other counseling recommendations.  Return for  twice weekly testing.   Dorathy Kinsman, CNM

## 2016-07-10 NOTE — Telephone Encounter (Signed)
Notified patient of positive GBS in urine and V Smith, CNM has sent in a RX for her to [facility].  Pt has appt Friday with midwife and wants to settle on a date for IOL for hypertension.  Looking at 07/28/16

## 2016-07-10 NOTE — Progress Notes (Signed)
GBS UTI. Rx Amox.  

## 2016-07-13 ENCOUNTER — Ambulatory Visit (INDEPENDENT_AMBULATORY_CARE_PROVIDER_SITE_OTHER): Payer: Medicaid Other | Admitting: Advanced Practice Midwife

## 2016-07-13 ENCOUNTER — Encounter (HOSPITAL_COMMUNITY): Payer: Self-pay | Admitting: *Deleted

## 2016-07-13 ENCOUNTER — Inpatient Hospital Stay (HOSPITAL_COMMUNITY): Payer: Medicaid Other

## 2016-07-13 ENCOUNTER — Inpatient Hospital Stay (HOSPITAL_COMMUNITY)
Admission: AD | Admit: 2016-07-13 | Discharge: 2016-07-16 | DRG: 774 | Disposition: A | Discharge status: Home / Self Care | Payer: Medicaid Other | Source: Ambulatory Visit | Attending: Obstetrics & Gynecology | Admitting: Obstetrics & Gynecology

## 2016-07-13 VITALS — BP 128/87 | HR 90 | Wt 195.0 lb

## 2016-07-13 DIAGNOSIS — Z3A35 35 weeks gestation of pregnancy: Secondary | ICD-10-CM | POA: Diagnosis not present

## 2016-07-13 DIAGNOSIS — O1414 Severe pre-eclampsia complicating childbirth: Secondary | ICD-10-CM | POA: Diagnosis present

## 2016-07-13 DIAGNOSIS — O9081 Anemia of the puerperium: Secondary | ICD-10-CM | POA: Diagnosis not present

## 2016-07-13 DIAGNOSIS — O99824 Streptococcus B carrier state complicating childbirth: Secondary | ICD-10-CM | POA: Diagnosis present

## 2016-07-13 DIAGNOSIS — R8271 Bacteriuria: Secondary | ICD-10-CM

## 2016-07-13 DIAGNOSIS — R0602 Shortness of breath: Secondary | ICD-10-CM

## 2016-07-13 DIAGNOSIS — D62 Acute posthemorrhagic anemia: Secondary | ICD-10-CM | POA: Diagnosis not present

## 2016-07-13 DIAGNOSIS — O133 Gestational [pregnancy-induced] hypertension without significant proteinuria, third trimester: Secondary | ICD-10-CM

## 2016-07-13 DIAGNOSIS — O1413 Severe pre-eclampsia, third trimester: Secondary | ICD-10-CM

## 2016-07-13 DIAGNOSIS — R030 Elevated blood-pressure reading, without diagnosis of hypertension: Secondary | ICD-10-CM | POA: Diagnosis present

## 2016-07-13 DIAGNOSIS — Z880 Allergy status to penicillin: Secondary | ICD-10-CM | POA: Diagnosis not present

## 2016-07-13 DIAGNOSIS — O0990 Supervision of high risk pregnancy, unspecified, unspecified trimester: Secondary | ICD-10-CM

## 2016-07-13 DIAGNOSIS — O09299 Supervision of pregnancy with other poor reproductive or obstetric history, unspecified trimester: Secondary | ICD-10-CM

## 2016-07-13 DIAGNOSIS — Z823 Family history of stroke: Secondary | ICD-10-CM | POA: Diagnosis not present

## 2016-07-13 DIAGNOSIS — Z8249 Family history of ischemic heart disease and other diseases of the circulatory system: Secondary | ICD-10-CM | POA: Diagnosis not present

## 2016-07-13 DIAGNOSIS — O1415 Severe pre-eclampsia, complicating the puerperium: Secondary | ICD-10-CM | POA: Diagnosis present

## 2016-07-13 DIAGNOSIS — O0993 Supervision of high risk pregnancy, unspecified, third trimester: Secondary | ICD-10-CM

## 2016-07-13 DIAGNOSIS — O1410 Severe pre-eclampsia, unspecified trimester: Secondary | ICD-10-CM | POA: Diagnosis present

## 2016-07-13 LAB — COMPREHENSIVE METABOLIC PANEL
ALBUMIN: 3.2 g/dL — AB (ref 3.6–5.1)
ALT: 7 U/L (ref 6–29)
AST: 14 U/L (ref 10–30)
Alkaline Phosphatase: 80 U/L (ref 33–115)
BILIRUBIN TOTAL: 0.5 mg/dL (ref 0.2–1.2)
BUN: 6 mg/dL — ABNORMAL LOW (ref 7–25)
CALCIUM: 8.1 mg/dL — AB (ref 8.6–10.2)
CHLORIDE: 105 mmol/L (ref 98–110)
CO2: 22 mmol/L (ref 20–31)
CREATININE: 0.62 mg/dL (ref 0.50–1.10)
Glucose, Bld: 79 mg/dL (ref 65–99)
Potassium: 3.6 mmol/L (ref 3.5–5.3)
Sodium: 137 mmol/L (ref 135–146)
TOTAL PROTEIN: 5.6 g/dL — AB (ref 6.1–8.1)

## 2016-07-13 LAB — CBC
HEMATOCRIT: 34.4 % — AB (ref 35.0–45.0)
HEMOGLOBIN: 12.1 g/dL (ref 11.7–15.5)
MCH: 30.9 pg (ref 27.0–33.0)
MCHC: 35.2 g/dL (ref 32.0–36.0)
MCV: 88 fL (ref 80.0–100.0)
MPV: 10.6 fL (ref 7.5–12.5)
Platelets: 130 10*3/uL — ABNORMAL LOW (ref 140–400)
RBC: 3.91 MIL/uL (ref 3.80–5.10)
RDW: 13.2 % (ref 11.0–15.0)
WBC: 6.5 10*3/uL (ref 3.8–10.8)

## 2016-07-13 LAB — PROTEIN / CREATININE RATIO, URINE
CREATININE, URINE: 86 mg/dL (ref 20–320)
Creatinine, Urine: 146 mg/dL
PROTEIN CREATININE RATIO: 140 mg/g{creat} (ref 21–161)
Protein Creatinine Ratio: 0.14 mg/mg{Cre} (ref 0.00–0.15)
TOTAL PROTEIN, URINE: 12 mg/dL (ref 5–24)
Total Protein, Urine: 20 mg/dL

## 2016-07-13 MED ORDER — OXYTOCIN 40 UNITS IN LACTATED RINGERS INFUSION - SIMPLE MED
1.0000 m[IU]/min | INTRAVENOUS | Status: DC
  Administered 2016-07-13: 2 m[IU]/min via INTRAVENOUS

## 2016-07-13 MED ORDER — LIDOCAINE HCL (PF) 1 % IJ SOLN
30.0000 mL | INTRAMUSCULAR | Status: DC | PRN
  Filled 2016-07-13: qty 30

## 2016-07-13 MED ORDER — PENICILLIN G POT IN DEXTROSE 60000 UNIT/ML IV SOLN
3.0000 10*6.[IU] | INTRAVENOUS | Status: DC
  Administered 2016-07-14 (×2): 3 10*6.[IU] via INTRAVENOUS
  Filled 2016-07-13 (×6): qty 50

## 2016-07-13 MED ORDER — MAGNESIUM SULFATE BOLUS VIA INFUSION
4.0000 g | Freq: Once | INTRAVENOUS | Status: AC
  Administered 2016-07-13: 4 g via INTRAVENOUS
  Filled 2016-07-13: qty 500

## 2016-07-13 MED ORDER — OXYCODONE-ACETAMINOPHEN 5-325 MG PO TABS: 1.0000 | ORAL_TABLET | ORAL | Status: DC | PRN

## 2016-07-13 MED ORDER — ONDANSETRON HCL 4 MG/2ML IJ SOLN
4.0000 mg | Freq: Four times a day (QID) | INTRAMUSCULAR | Status: DC | PRN
  Filled 2016-07-13: qty 2

## 2016-07-13 MED ORDER — PENICILLIN G POTASSIUM 5000000 UNITS IJ SOLR
5.0000 10*6.[IU] | Freq: Once | INTRAVENOUS | Status: AC
  Administered 2016-07-13: 5 10*6.[IU] via INTRAVENOUS
  Filled 2016-07-13: qty 5

## 2016-07-13 MED ORDER — TERBUTALINE SULFATE 1 MG/ML IJ SOLN: 0.2500 mg | Freq: Once | INTRAMUSCULAR | Status: DC | PRN

## 2016-07-13 MED ORDER — LACTATED RINGERS IV SOLN: INTRAVENOUS | Status: DC

## 2016-07-13 MED ORDER — SOD CITRATE-CITRIC ACID 500-334 MG/5ML PO SOLN: 30.0000 mL | ORAL | Status: DC | PRN

## 2016-07-13 MED ORDER — OXYTOCIN BOLUS FROM INFUSION
500.0000 mL | Freq: Once | INTRAVENOUS | Status: AC
  Administered 2016-07-14: 500 mL via INTRAVENOUS

## 2016-07-13 MED ORDER — BETAMETHASONE SOD PHOS & ACET 6 (3-3) MG/ML IJ SUSP
12.0000 mg | Freq: Once | INTRAMUSCULAR | Status: AC
  Administered 2016-07-13: 12 mg via INTRAMUSCULAR
  Filled 2016-07-13: qty 2

## 2016-07-13 MED ORDER — MAGNESIUM SULFATE 40 G IN LACTATED RINGERS - SIMPLE
2.0000 g/h | INTRAVENOUS | Status: DC
Start: 2016-07-13 — End: 2016-07-14
  Administered 2016-07-14: 2 g/h via INTRAVENOUS
  Filled 2016-07-13 (×2): qty 500

## 2016-07-13 MED ORDER — LACTATED RINGERS IV SOLN
INTRAVENOUS | Status: DC
Start: 2016-07-13 — End: 2016-07-14
  Administered 2016-07-13 (×2): via INTRAVENOUS

## 2016-07-13 MED ORDER — OXYCODONE-ACETAMINOPHEN 5-325 MG PO TABS: 2.0000 | ORAL_TABLET | ORAL | Status: DC | PRN

## 2016-07-13 MED ORDER — LACTATED RINGERS IV SOLN
500.0000 mL | INTRAVENOUS | Status: DC | PRN
  Administered 2016-07-14: 500 mL via INTRAVENOUS

## 2016-07-13 MED ORDER — ACETAMINOPHEN 325 MG PO TABS
650.0000 mg | ORAL_TABLET | ORAL | Status: DC | PRN
  Administered 2016-07-13: 650 mg via ORAL
  Filled 2016-07-13: qty 2

## 2016-07-13 MED ORDER — OXYTOCIN 40 UNITS IN LACTATED RINGERS INFUSION - SIMPLE MED
2.5000 [IU]/h | INTRAVENOUS | Status: DC
  Administered 2016-07-14: 2.5 [IU]/h via INTRAVENOUS
  Filled 2016-07-13: qty 1000

## 2016-07-13 NOTE — MAU Note (Signed)
On Labetalol for almost 2 months, BP was 140/99 today in office.  Has a headache, took Fioricet, not really helping. Blurring, denies epigastric pain, some swelling in hands, lips and face

## 2016-07-13 NOTE — MAU Provider Note (Signed)
Chief Complaint:  Hypertension   First Provider Initiated Contact with Patient 07/13/16 1851     HPI: Rebekah Smith is a 31 y.o. [id number] at 15w6dwho presents to maternity admissions reporting elevated blood pressure, shortness of breath ("like I ran a marathon"), heacache and seeing black spots. Face is swollen.   Has been followed for this at [facility]. Is on Labetalol.   She reports good fetal movement, denies LOF, vaginal bleeding, vaginal itching/burning, urinary symptoms, h/a, dizziness, n/v, diarrhea, constipation or fever/chills.  She denies headache, visual changes or RUQ abdominal pain.  Hypertension  This is a recurrent problem. The current episode started more than 1 month ago. The problem has been gradually worsening since onset. Associated symptoms include blurred vision, headaches, malaise/fatigue, peripheral edema (trace) and shortness of breath. Pertinent negatives include no anxiety, chest pain, orthopnea or palpitations. There are no associated agents to hypertension. Treatments tried: labetalol. The current treatment provides no improvement. There are no compliance problems.     RN Note: On Labetalol for almost 2 months, BP was 140/99 today in office.  Has a headache, took Fioricet, not really helping. Blurring, denies epigastric pain, some swelling in hands, lips and face  Past Medical History: Past Medical History:  Diagnosis Date  . Abnormal Pap smear of cervix    colposcopy  . Anemia   . Blood transfusion   . Hemorrhage   . History of preeclampsia    Had in two last pregnancies, denies chronic hypertension as she is normotensive when not pregnant  . Pregnancy induced hypertension     Past obstetric history: OB History  Gravida Para Term Preterm AB Living  SAB TAB Ectopic Multiple Live Births  1     0 3    # Outcome Date GA Lbr Len/2nd Weight Sex Delivery Anes PTL Lv  5 Current           4 Preterm 04/30/14 [redacted]w[redacted]d / 00:14 6 lb 2.1 oz (2.781  kg) M Vag-Spont EPI  LIV     Birth Comments: within normal limits  3 SAB 06/09/13          2 Term 09/01/10 [redacted]w[redacted]d  9 lb 9 oz (4.338 kg) M Vag-Spont EPI N LIV     Birth Comments: PPH  1 Term 12/03/07 [redacted]w[redacted]d  8 lb 9 oz (3.884 kg) M Vag-Spont EPI N LIV      Past Surgical History: Past Surgical History:  Procedure Laterality Date  . TONSILLECTOMY    . TONSILLECTOMY      Family History: Family History  Problem Relation Age of Onset  . Anxiety disorder Mother   . Hypertension Mother   . Heart murmur Mother   . Breast cancer Maternal Aunt   . Breast cancer Paternal Grandmother   . Cervical cancer Maternal Aunt   . Stroke Maternal Grandmother     Social History: Social History  Substance Use Topics  . Smoking status: Never Smoker  . Smokeless tobacco: Never Used  . Alcohol use No    Allergies: No Known Allergies  Meds:  Prescriptions Prior to Admission  Medication Sig Dispense Refill Last Dose  . acetaminophen (TYLENOL) 325 MG tablet Take 325 mg by mouth every 6 (six) hours as needed.    Taking  . amoxicillin (AMOXIL) 875 MG tablet Take 1 tablet (875 mg total) by mouth 2 (two) times daily. (Patient not taking: Reported on 07/13/2016) 14 tablet 0 Not Taking  . aspirin EC  81 MG tablet Take 1 tablet (81 mg total) by mouth daily. Take after 12 weeks for prevention of preeclampsia later in pregnancy 300 tablet 2 Taking  . Butalbital-APAP-Caffeine 50-325-40 MG capsule Take 1-2 capsules by mouth every 6 (six) hours as needed for headache. (Patient not taking: Reported on 07/13/2016) 30 capsule 3 Not Taking  . labetalol (NORMODYNE) 200 MG tablet Take 1 tablet (200 mg total) by mouth 2 (two) times daily. 60 tablet 3 Taking  . Prenatal Multivit-Min-Fe-FA (PRENATAL VITAMINS PO) Take 1 tablet by mouth daily.    Taking    I have reviewed patient's Past Medical Hx, Surgical Hx, Family Hx, Social Hx, medications and allergies.   ROS:  Review of Systems  Constitutional: Positive for  malaise/fatigue.  Eyes: Positive for blurred vision.  Respiratory: Positive for shortness of breath.   Cardiovascular: Negative for chest pain, palpitations and orthopnea.  Neurological: Positive for headaches.   Other systems negative  Physical Exam  Patient Vitals for the past 24 hrs:  BP Temp Temp src Pulse Resp SpO2  07/13/16 1828 140/86 97.7 F (36.5 C) Oral (!) 106 20 100 %   Vitals:   07/13/16 1915 07/13/16 1930 07/13/16 2035 07/13/16 2040  BP: (!) 141/91 (!) 146/102 (!) 141/108 (!) 158/94  Pulse: (!) 120 97 (!) 103 95  Resp:      Temp:      TempSrc:      SpO2:        Constitutional: Well-developed, well-nourished female in no acute distress.  Cardiovascular: tachycardic, normal rhythm Respiratory: normal effort, clear to auscultation bilaterally GI: Abd soft, non-tender, gravid appropriate for gestational age.   No rebound or guarding. MS: Extremities nontender, Trace edema, normal ROM Neurologic: Alert and oriented x 4. DTRs 1+/no clonus GU: Neg CVAT.  PELVIC EXAM: deferred.  2cm in office    FHT:  Baseline 140 , moderate variability, accelerations present, no decelerations Contractions:  Irregular     Labs: O/POS/-- (10/10 1018) Results for orders placed or performed during the hospital encounter of 07/13/16 (from the past 24 hour(s))  Protein / creatinine ratio, urine     Status: None   Collection Time: 07/13/16  6:35 PM  Result Value Ref Range   Creatinine, Urine 146.00 mg/dL   Total Protein, Urine 20 mg/dL   Protein Creatinine Ratio 0.14 0.00 - 0.15 mg/mg[Cre]    Ref. Range 07/13/2016 11:08  Sodium Latest Ref Range: 135 - 146 mmol/L 137  Potassium Latest Ref Range: 3.5 - 5.3 mmol/L 3.6  Chloride Latest Ref Range: 98 - 110 mmol/L 105  CO2 Latest Ref Range: 20 - 31 mmol/L 22  Glucose Latest Ref Range: 65 - 99 mg/dL 79  BUN Latest Ref Range: 7 - 25 mg/dL 6 (L)  Creatinine Latest Ref Range: 0.50 - 1.10 mg/dL 1.61  Calcium Latest Ref Range: 8.6 - 10.2  mg/dL 8.1 (L)  Alkaline Phosphatase Latest Ref Range: 33 - 115 U/L 80  Albumin Latest Ref Range: 3.6 - 5.1 g/dL 3.2 (L)  AST Latest Ref Range: 10 - 30 U/L 14  ALT Latest Ref Range: 6 - 29 U/L 7  Total Protein Latest Ref Range: 6.1 - 8.1 g/dL 5.6 (L)  Total Bilirubin Latest Ref Range: 0.2 - 1.2 mg/dL 0.5    Ref. Range 07/09/2016 17:21 07/13/2016 11:08  WBC Latest Ref Range: 3.8 - 10.8 K/uL 7.3 6.5  RBC Latest Ref Range: 3.80 - 5.10 MIL/uL 3.99 3.91  Hemoglobin Latest Ref Range: 11.7 - 15.5 g/dL  12.5 12.1  HCT Latest Ref Range: 35.0 - 45.0 % 35.7 (L) 34.4 (L)  MCV Latest Ref Range: 80.0 - 100.0 fL 89.5 88.0  MCH Latest Ref Range: 27.0 - 33.0 pg 31.3 30.9  MCHC Latest Ref Range: 32.0 - 36.0 g/dL 16.1 09.6  RDW Latest Ref Range: 11.0 - 15.0 % 13.8 13.2  Platelets Latest Ref Range: 140 - 400 K/uL 141 (L) 130 (L)   Imaging:    MAU Course/MDM: I have ordered labs and reviewed results.  NST reviewed Consult Dr Emelda Fear with presentation, exam findings and test results.  He recommends MgSO4 and induction of labor due to borderline severe range pressures (on Labetalol), headache and visual changes.   Assessment: 1. Group B streptococcal bacteriuria   2. Gestational hypertension without significant proteinuria in third trimester   3. Supervision of high risk pregnancy, antepartum   4. Hx of preeclampsia, prior pregnancy, currently pregnant   5. Shortness of breath   6.     SIUP at [redacted]w[redacted]d 7.     Severe preeclampsia  Plan: Admit to Birthing Suites Routine orders Magnesium Sulfate Betamethasone Induction of labor  Wynelle Bourgeois CNM, MSN Certified Nurse-Midwife 07/13/2016 7:29 PM

## 2016-07-13 NOTE — H&P (Signed)
LABOR ADMISSION HISTORY AND PHYSICAL  Rebekah Smith is a 31 y.o. female [phone] with IUP at 5w6dby LMP c/w 6w USwho presented to MAU for elevated blood pressure, shortness of breath, headache and seeing black spots. Her symptoms are better now. [facility] Is on Labetalol 200 mg twice a day.  She reports regular contraction every 3 minutes. Fetal movement present and at baseline. Denies fluid leak or gush or vaginal bleeding. She plans on bottle and breast feeding. She request BTL for birth control.  [facility] She likes to have epidural for pain. Hx PPH req tx after 2nd baby.   Dating: By LMP and 6w [place]--->  Estimated Date of Delivery: 08/18/16  Sono:    @[redacted]w[redacted]d , CWD, normal anatomy, cephalic presentation, long lie, 2389  gm   83  %EFW  Prenatal History/Complications:  Clinic [facility] Prenatal Labs  Dating LMP c/w 6 week scan Blood type: O/POS/-- (10/10 1018)   Genetic Screen 1 Screen: normal  AFP:  Refused Antibody:POS (10/10 1018)  Anatomic [place] Normal but limited views of heart Rubella: 1.52 (10/10 1018)  GTT Third trimester: normal RPR: NON REAC (10/10 1018)   Flu vaccine 02/07/16 HBsAg: NEGATIVE (10/10 1018)   TDaP vaccine                                               HIV: Non Reactive (09/25 2231)   Baby Food Hopefully Breast!                                         GBS: Pos urine (For PCN allergy, check sensitivities)  Contraception Mirena versus BTL (signed Medicaid forms 07/09/16) Pap: 06/11/2014 normal  Circumcision Yes--outpatient   [place]     Past Medical History: Past Medical History:  Diagnosis Date  . Abnormal Pap smear of cervix    colposcopy  . Anemia   . Blood transfusion   . Hemorrhage   . History of preeclampsia    Had in two last pregnancies, denies chronic hypertension as she is normotensive when not  pregnant  . Pregnancy induced hypertension     Past Surgical History: Past Surgical History:  Procedure Laterality Date  . TONSILLECTOMY    . TONSILLECTOMY      Obstetrical History: OB History    Gravida Para Term Preterm AB Living   5 3 2 1 1 3    SAB TAB Ectopic Multiple Live Births   1     0 3      Social History: Social History   Social History  . Marital status: Single    Spouse name: N/A  . Number of children: N/A  . Years of education: N/A   Occupational History  . waitress [employer]  Social History Main Topics  . Smoking status: Never Smoker  . Smokeless tobacco: Never Used  . Alcohol use No  . Drug use: No  . Sexual activity: Yes    Partners: Male    Birth control/ protection: None   Other Topics Concern  . None   Social History Narrative  . None  Family History: Family History  Problem Relation Age of Onset  . Anxiety disorder Mother   . Hypertension Mother   . Heart murmur Mother   . Breast cancer Maternal Aunt   . Breast cancer Paternal Grandmother   . Cervical cancer Maternal Aunt   . Stroke Maternal Grandmother     Allergies: No Known Allergies  Prescriptions Prior to Admission  Medication Sig Dispense Refill Last Dose  . acetaminophen (TYLENOL) 325 MG tablet Take 325 mg by mouth every 6 (six) hours as needed.    Taking  . amoxicillin (AMOXIL) 875 MG tablet Take 1 tablet (875 mg total) by mouth 2 (two) times daily. (Patient not taking: Reported on 07/13/2016) 14 tablet 0 Not Taking  . aspirin EC 81 MG tablet Take 1 tablet (81 mg total) by mouth daily. Take after 12 weeks for prevention of preeclampsia later in pregnancy 300 tablet 2 Taking  . Butalbital-APAP-Caffeine 50-325-40 MG capsule Take 1-2 capsules by mouth every 6 (six) hours as needed for headache. (Patient not taking: Reported on 07/13/2016) 30 capsule 3 Not Taking  . labetalol (NORMODYNE) 200 MG tablet Take 1 tablet (200 mg total) by mouth 2 (two) times daily. 60 tablet  3 Taking  . Prenatal Multivit-Min-Fe-FA (PRENATAL VITAMINS PO) Take 1 tablet by mouth daily.    Taking     Review of Systems  Blood pressure 137/82, pulse 88, temperature 97.9 F (36.6 C), temperature source Axillary, resp. rate 18, last menstrual period 11/12/2015, SpO2 96 %. GEN: appearance: alert, cooperative and appears stated age RESP: clear to auscultation bilaterally, no increased WOB CVS:: regular rate and rhythm, no murmurs, no sign of DVT, +2 DP GI: soft, non-tender; bowel sounds normal MSK: WWP, Homans sign is negative,  NEURO: patellar DTRs 1+ bilaterally PSYCH: normal mood and affect  Pelvic Exam: Cervical exam: Dilation: 2 Effacement (%): Thick Station: -3 Exam by:: L.Mears,RN Presentation: cephalic Uterine activityDate/time of onset: 07/13/2016 in the morning, Frequency: Every 3 minutes, Duration: 20 seconds and Intensity: mild  Fetal monitoringBaseline: 140 bpm, Variability: Good {> 6 bpm), Accelerations: Reactive and Decelerations: Absent  Prenatal labs: ABO, Rh: --/--/O POS (04/13 2100) Antibody: POS (04/13 2100) Rubella: !Error! RPR: NON REAC (02/28 0824)  HBsAg: NEGATIVE (10/10 1018)  HIV: NONREACTIVE (02/28 0824)  GBS: Positive (04/06 0000)  1 hr Glucola normal Genetic screening  Primary screen normal. Declined AFP Anatomy US: normal  Prenatal Transfer Tool  Maternal Diabetes: No Genetic Screening: Declined but declined AFP Maternal Ultrasounds/Referrals: Normal Fetal Ultrasounds or other Referrals:  Referred to Materal Fetal Medicine  Maternal Substance Abuse:  No Significant Maternal Medications:  Meds include: Other: labetalol and aspirin Significant Maternal Lab Results: Lab values include: Other: GBS unk  Results for orders placed or performed during the hospital encounter of 07/13/16 (from the past 24 hour(s))  Protein / creatinine ratio, urine   Collection Time: 07/13/16  6:35 PM  Result Value Ref Range   Creatinine, Urine 146.00 mg/dL    Total Protein, Urine 20 mg/dL   Protein Creatinine Ratio 0.14 0.00 - 0.15 mg/mg[Cre]  Type and screen Grant   Collection Time: 07/13/16  9:00 PM  Result Value Ref Range   ABO/RH(D) O POS    Antibody Screen POS    Sample Expiration 07/16/2016    DAT, IgG NEG    Antibody Identification PENDING   Results for orders placed or performed in visit on 07/13/16 (from the past 24 hour(s))  CBC   Collection Time: 07/13/16  11:08 AM  Result Value Ref Range   WBC 6.5 3.8 - 10.8 K/uL   RBC 3.91 3.80 - 5.10 MIL/uL   Hemoglobin 12.1 11.7 - 15.5 g/dL   HCT 34.4 (L) 35.0 - 45.0 %   MCV 88.0 80.0 - 100.0 fL   MCH 30.9 27.0 - 33.0 pg   MCHC 35.2 32.0 - 36.0 g/dL   RDW 13.2 11.0 - 15.0 %   Platelets 130 (L) 140 - 400 K/uL   MPV 10.6 7.5 - 12.5 fL  Comp Met (CMET)   Collection Time: 07/13/16 11:08 AM  Result Value Ref Range   Sodium 137 135 - 146 mmol/L   Potassium 3.6 3.5 - 5.3 mmol/L   Chloride 105 98 - 110 mmol/L   CO2 22 20 - 31 mmol/L   Glucose, Bld 79 65 - 99 mg/dL   BUN 6 (L) 7 - 25 mg/dL   Creat 0.62 0.50 - 1.10 mg/dL   Total Bilirubin 0.5 0.2 - 1.2 mg/dL   Alkaline Phosphatase 80 33 - 115 U/L   AST 14 10 - 30 U/L   ALT 7 6 - 29 U/L   Total Protein 5.6 (L) 6.1 - 8.1 g/dL   Albumin 3.2 (L) 3.6 - 5.1 g/dL   Calcium 8.1 (L) 8.6 - 10.2 mg/dL  Protein / creatinine ratio, urine   Collection Time: 07/13/16 11:08 AM  Result Value Ref Range   Creatinine, Urine 86 20 - 320 mg/dL   Total Protein, Urine 12 5 - 24 mg/dL   Protein Creatinine Ratio 140 21 - 161 mg/g creat    Patient Active Problem List   Diagnosis Date Noted  . Preeclampsia, severe 07/13/2016  . Group B streptococcal bacteriuria 07/10/2016  . Gestational hypertension without significant proteinuria 06/08/2016  . Supervision of high risk pregnancy, antepartum 01/16/2016  . 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 History of postpartum hemorrhage 11/16/2013  . History of preeclampsia in  prior pregnancy, currently pregnant 11/16/2013    Assessment: VELINA DROLLINGER is a 31 y.o. [phone] at 56w6dhere for IOL for severe Pre-E # Severe pre-E: Mg, Lab protocol, repeat BMZ (had x2 about 5 weeks ago) #Labor:  pitocin #Pain: Epidural upon request #FWB:  CAT-1 #ID: [id number] pos. PCN #MOF: BB #MOC: BTL- preterm qualification (signed paper on 07/09/2016) #Circ:  outpatient  TMercy Riding4/13/2018, 11:05 PM   CNM attestation:  I have seen and examined this patient; I agree with above documentation in the resident's note.   Rebekah BEASTONis a 31y.o. [id number] for IOL due to severe pre-e as evidenced by H/A and visual disturbances, and elevated BPs  PE: BP 117/83   Pulse 90   Temp 97.9 F (36.6 C) (Axillary)   Resp 16   Ht 5' 7"  (1.702 m)   Wt 88.5 kg (195 lb)   LMP 11/12/2015   SpO2 96%   BMI 30.54 kg/m  Gen: calm comfortable, NAD Resp: normal effort, no distress Abd: gravid  ROS, labs, PMH reviewed  [place] Will start Pit for IOL method PCN for unk GBS ppx Prepare for [place] Village[place]  SHAW, KIMBERLY CNM 07/14/2016, 3:29 AM

## 2016-07-13 NOTE — MAU Note (Signed)
Urine in lab 

## 2016-07-13 NOTE — Progress Notes (Addendum)
G5P2 @ 34.[redacted] wksga. Presents to triage for ha and blurred vision. Denies LOF or bleeding. +FM  EFM applied  1930: Provider at bs assessing pt with another RN. SVE 2/thick/high  1939: Pt to Xray  1943: pt back from xray  2043: birthing suite notified. Report status of pt given. Pt to 163.   2100: labs drawn and IV inserted with LR infusing.   2120: pt to birthing room 163

## 2016-07-13 NOTE — Patient Instructions (Signed)
Shortness of Breath, Adult °Shortness of breath is when a person has trouble breathing enough air, or when a person feels like she or he is having trouble breathing in enough air. Shortness of breath could be a sign of medical problem. °Follow these instructions at home: °Pay attention to any changes in your symptoms. Take these actions to help with your condition: °· Do not smoke. Smoking is a common cause of shortness of breath. If you smoke and you need help quitting, ask your health care provider. °· Avoid things that can irritate your airways, such as: °¨ Mold. °¨ Dust. °¨ Air pollution. °¨ Chemical fumes. °¨ Things that can cause allergy symptoms (allergens), if you have allergies. °· Keep your living space clean and free of mold and dust. °· Rest as needed. Slowly return to your usual activities. °· Take over-the-counter and prescription medicines, including oxygen and inhaled medicines, only as told by your health care provider. °· Keep all follow-up visits as told by your health care provider. This is important. °Contact a health care provider if: °· Your condition does not improve as soon as expected. °· You have a hard time doing your normal activities, even after you rest. °· You have new symptoms. °Get help right away if: °· Your shortness of breath gets worse. °· You have shortness of breath when you are resting. °· You feel light-headed or you faint. °· You have a cough that is not controlled with medicines. °· You cough up blood. °· You have pain with breathing. °· You have pain in your chest, arms, shoulders, or abdomen. °· You have a fever. °· You cannot walk up stairs or exercise the way that you normally do. °This information is not intended to replace advice given to you by your health care provider. Make sure you discuss any questions you have with your health care provider. °Document Released: 12/12/2000 Document Revised: 10/08/2015 Document Reviewed: 08/25/2015 °Elsevier Interactive Patient  Education © 2017 [employer] ° °

## 2016-07-14 ENCOUNTER — Encounter (HOSPITAL_COMMUNITY): Payer: Self-pay | Admitting: *Deleted

## 2016-07-14 ENCOUNTER — Inpatient Hospital Stay (HOSPITAL_COMMUNITY): Payer: Medicaid Other | Admitting: Anesthesiology

## 2016-07-14 DIAGNOSIS — O99824 Streptococcus B carrier state complicating childbirth: Secondary | ICD-10-CM

## 2016-07-14 DIAGNOSIS — O1414 Severe pre-eclampsia complicating childbirth: Secondary | ICD-10-CM

## 2016-07-14 DIAGNOSIS — Z3A35 35 weeks gestation of pregnancy: Secondary | ICD-10-CM

## 2016-07-14 LAB — CBC
HCT: 35.1 % — ABNORMAL LOW (ref 36.0–46.0)
HEMATOCRIT: 35.5 % — AB (ref 36.0–46.0)
HEMOGLOBIN: 12.5 g/dL (ref 12.0–15.0)
Hemoglobin: 12 g/dL (ref 12.0–15.0)
MCH: 31 pg (ref 26.0–34.0)
MCH: 30.6 pg (ref 26.0–34.0)
MCHC: 35.2 g/dL (ref 30.0–36.0)
MCHC: 34.2 g/dL (ref 30.0–36.0)
MCV: 88.1 fL (ref 78.0–100.0)
MCV: 89.5 fL (ref 78.0–100.0)
PLATELETS: 132 10*3/uL — AB (ref 150–400)
Platelets: 176 10*3/uL (ref 150–400)
RBC: 4.03 MIL/uL (ref 3.87–5.11)
RBC: 3.92 MIL/uL (ref 3.87–5.11)
RDW: 13.7 % (ref 11.5–15.5)
RDW: 13.7 % (ref 11.5–15.5)
WBC: 8.4 10*3/uL (ref 4.0–10.5)
WBC: 15.6 10*3/uL — ABNORMAL HIGH (ref 4.0–10.5)

## 2016-07-14 LAB — RPR: RPR Ser Ql: NONREACTIVE

## 2016-07-14 LAB — PREPARE RBC (CROSSMATCH)

## 2016-07-14 MED ORDER — LACTATED RINGERS IV SOLN
500.0000 mL | Freq: Once | INTRAVENOUS | Status: AC
  Administered 2016-07-14: 500 mL via INTRAVENOUS

## 2016-07-14 MED ORDER — DIPHENOXYLATE-ATROPINE 2.5-0.025 MG PO TABS
2.0000 | ORAL_TABLET | Freq: Four times a day (QID) | ORAL | Status: DC | PRN
  Administered 2016-07-14: 2 via ORAL
  Filled 2016-07-14: qty 2

## 2016-07-14 MED ORDER — PHENYLEPHRINE 40 MCG/ML (10ML) SYRINGE FOR IV PUSH (FOR BLOOD PRESSURE SUPPORT)
80.0000 ug | PREFILLED_SYRINGE | INTRAVENOUS | Status: DC | PRN
  Filled 2016-07-14: qty 5

## 2016-07-14 MED ORDER — OXYTOCIN 40 UNITS IN LACTATED RINGERS INFUSION - SIMPLE MED: 2.5000 [IU]/h | INTRAVENOUS | Status: DC | PRN

## 2016-07-14 MED ORDER — OXYCODONE-ACETAMINOPHEN 5-325 MG PO TABS
1.0000 | ORAL_TABLET | ORAL | Status: DC | PRN
  Administered 2016-07-14 – 2016-07-15 (×3): 2 via ORAL
  Filled 2016-07-14 (×3): qty 2

## 2016-07-14 MED ORDER — ONDANSETRON HCL 4 MG PO TABS: 4.0000 mg | ORAL_TABLET | ORAL | Status: DC | PRN

## 2016-07-14 MED ORDER — SODIUM CHLORIDE 0.9% FLUSH: 3.0000 mL | Freq: Two times a day (BID) | INTRAVENOUS | Status: DC

## 2016-07-14 MED ORDER — SODIUM CHLORIDE 0.9% FLUSH
3.0000 mL | INTRAVENOUS | Status: DC | PRN
  Administered 2016-07-15: 3 mL via INTRAVENOUS
  Filled 2016-07-14: qty 3

## 2016-07-14 MED ORDER — MAGNESIUM SULFATE 40 G IN LACTATED RINGERS - SIMPLE: 2.0000 g/h | INTRAVENOUS | Status: DC

## 2016-07-14 MED ORDER — PHENYLEPHRINE 40 MCG/ML (10ML) SYRINGE FOR IV PUSH (FOR BLOOD PRESSURE SUPPORT)
80.0000 ug | PREFILLED_SYRINGE | INTRAVENOUS | Status: DC | PRN
  Filled 2016-07-14: qty 5
  Filled 2016-07-14: qty 10

## 2016-07-14 MED ORDER — EPHEDRINE 5 MG/ML INJ
10.0000 mg | INTRAVENOUS | Status: DC | PRN
  Filled 2016-07-14: qty 2

## 2016-07-14 MED ORDER — DIBUCAINE 1 % RE OINT: 1.0000 "application " | TOPICAL_OINTMENT | RECTAL | Status: DC | PRN

## 2016-07-14 MED ORDER — DIPHENHYDRAMINE HCL 25 MG PO CAPS: 25.0000 mg | ORAL_CAPSULE | Freq: Four times a day (QID) | ORAL | Status: DC | PRN

## 2016-07-14 MED ORDER — BENZOCAINE-MENTHOL 20-0.5 % EX AERO
1.0000 "application " | INHALATION_SPRAY | CUTANEOUS | Status: DC | PRN
  Administered 2016-07-14: 1 via TOPICAL
  Filled 2016-07-14: qty 56

## 2016-07-14 MED ORDER — MISOPROSTOL 200 MCG PO TABS
400.0000 ug | ORAL_TABLET | Freq: Once | ORAL | Status: AC
  Administered 2016-07-14: 400 ug via BUCCAL

## 2016-07-14 MED ORDER — SODIUM CHLORIDE 0.9 % IV SOLN: 250.0000 mL | INTRAVENOUS | Status: DC | PRN

## 2016-07-14 MED ORDER — MAGNESIUM SULFATE 40 G IN LACTATED RINGERS - SIMPLE
2.0000 g/h | INTRAVENOUS | Status: AC
  Filled 2016-07-14: qty 500

## 2016-07-14 MED ORDER — MISOPROSTOL 200 MCG PO TABS
ORAL_TABLET | ORAL | Status: AC
  Administered 2016-07-14: 600 ug via RECTAL
  Filled 2016-07-14: qty 5

## 2016-07-14 MED ORDER — ONDANSETRON HCL 4 MG/2ML IJ SOLN: 4.0000 mg | INTRAMUSCULAR | Status: DC | PRN

## 2016-07-14 MED ORDER — CARBOPROST TROMETHAMINE 250 MCG/ML IM SOLN
250.0000 ug | Freq: Once | INTRAMUSCULAR | Status: AC
  Administered 2016-07-14: 250 ug via INTRAMUSCULAR

## 2016-07-14 MED ORDER — LIDOCAINE HCL (PF) 1 % IJ SOLN
INTRAMUSCULAR | Status: DC | PRN
  Administered 2016-07-14 (×2): 5 mL

## 2016-07-14 MED ORDER — ZOLPIDEM TARTRATE 5 MG PO TABS: 5.0000 mg | ORAL_TABLET | Freq: Every evening | ORAL | Status: DC | PRN

## 2016-07-14 MED ORDER — WITCH HAZEL-GLYCERIN EX PADS: 1.0000 "application " | MEDICATED_PAD | CUTANEOUS | Status: DC | PRN

## 2016-07-14 MED ORDER — SENNOSIDES-DOCUSATE SODIUM 8.6-50 MG PO TABS
2.0000 | ORAL_TABLET | ORAL | Status: DC
  Administered 2016-07-15 (×2): 2 via ORAL
  Filled 2016-07-14 (×2): qty 2

## 2016-07-14 MED ORDER — FENTANYL 2.5 MCG/ML BUPIVACAINE 1/10 % EPIDURAL INFUSION (WH - ANES)
14.0000 mL/h | INTRAMUSCULAR | Status: DC | PRN
  Administered 2016-07-14: 14 mL/h via EPIDURAL
  Filled 2016-07-14: qty 100

## 2016-07-14 MED ORDER — MISOPROSTOL 200 MCG PO TABS
ORAL_TABLET | ORAL | Status: AC
  Filled 2016-07-14: qty 1

## 2016-07-14 MED ORDER — COCONUT OIL OIL: 1.0000 "application " | TOPICAL_OIL | Status: DC | PRN

## 2016-07-14 MED ORDER — BUTALBITAL-APAP-CAFFEINE 50-325-40 MG PO TABS
1.0000 | ORAL_TABLET | Freq: Four times a day (QID) | ORAL | Status: DC | PRN
  Administered 2016-07-14: 1 via ORAL
  Filled 2016-07-14: qty 1

## 2016-07-14 MED ORDER — SODIUM CHLORIDE 0.9 % IV SOLN: Freq: Once | INTRAVENOUS | Status: DC

## 2016-07-14 MED ORDER — IBUPROFEN 600 MG PO TABS
600.0000 mg | ORAL_TABLET | Freq: Four times a day (QID) | ORAL | Status: DC
  Administered 2016-07-14 – 2016-07-16 (×8): 600 mg via ORAL
  Filled 2016-07-14 (×8): qty 1

## 2016-07-14 MED ORDER — SIMETHICONE 80 MG PO CHEW: 80.0000 mg | CHEWABLE_TABLET | ORAL | Status: DC | PRN

## 2016-07-14 MED ORDER — ACETAMINOPHEN 325 MG PO TABS
650.0000 mg | ORAL_TABLET | ORAL | Status: DC | PRN
  Administered 2016-07-16: 650 mg via ORAL
  Filled 2016-07-14: qty 2

## 2016-07-14 MED ORDER — TETANUS-DIPHTH-ACELL PERTUSSIS 5-2.5-18.5 LF-MCG/0.5 IM SUSP: 0.5000 mL | Freq: Once | INTRAMUSCULAR | Status: DC

## 2016-07-14 MED ORDER — PRENATAL MULTIVITAMIN CH
1.0000 | ORAL_TABLET | Freq: Every day | ORAL | Status: DC
  Administered 2016-07-15 – 2016-07-16 (×2): 1 via ORAL
  Filled 2016-07-14 (×2): qty 1

## 2016-07-14 MED ORDER — DIPHENHYDRAMINE HCL 50 MG/ML IJ SOLN: 12.5000 mg | INTRAMUSCULAR | Status: DC | PRN

## 2016-07-14 NOTE — Lactation Note (Addendum)
This note was copied from a baby's chart. Lactation Consultation Note  Patient Name: Rebekah Smith [id number] Date: 07/14/2016 Reason for consult: Initial assessment;NICU baby;Late preterm infant Baby in NICU, 35 wks. Gest. NICU booklet given to Mom along with Lactation brochure. Mom has had baby in NICU in past. Discussed the importance of pumping every 3 hours for 15 minutes followed by 5 minutes of hand expression to encourage milk production and to have EBM for baby. Storage guidelines discussed. Yellow labels given.  RN plans to set up DEBP for Mom. Mom to call for questions/concerns. Mom had vaginal delivery 700 EBL.   Maternal Data Has patient been taught Hand Expression?: No (Mom reports she knows how to hand express, exp BF.) Does the patient have breastfeeding experience prior to this delivery?: Yes  Feeding    LATCH Score/Interventions                      Lactation Tools Discussed/Used     Consult Status Consult Status: Follow-up Date: 07/15/16 Follow-up type: In-patient    Rebekah Smith 07/14/2016, 2:44 PM

## 2016-07-14 NOTE — Anesthesia Preprocedure Evaluation (Signed)
Anesthesia Evaluation  Patient identified by MRN, date of birth, ID band Patient awake    Reviewed: Allergy & Precautions, H&P , NPO status , Patient's Chart, lab work & pertinent test results  History of Anesthesia Complications Negative for: history of anesthetic complications  Airway Mallampati: II  TM Distance: >3 FB Neck ROM: full    Dental no notable dental hx. (+) Teeth Intact   Pulmonary neg pulmonary ROS,    Pulmonary exam normal breath sounds clear to auscultation       Cardiovascular hypertension, negative cardio ROS Normal cardiovascular exam Rhythm:regular Rate:Normal     Neuro/Psych negative neurological ROS  negative psych ROS   GI/Hepatic negative GI ROS, Neg liver ROS,   Endo/Other  negative endocrine ROS  Renal/GU negative Renal ROS  negative genitourinary   Musculoskeletal   Abdominal   Peds  Hematology negative hematology ROS (+)   Anesthesia Other Findings PIH on magnesium  Reproductive/Obstetrics (+) Pregnancy                             Anesthesia Physical Anesthesia Plan  ASA: II  Anesthesia Plan: Epidural   Post-op Pain Management:    Induction:   Airway Management Planned:   Additional Equipment:   Intra-op Plan:   Post-operative Plan:   Informed Consent: I have reviewed the patients History and Physical, chart, labs and discussed the procedure including the risks, benefits and alternatives for the proposed anesthesia with the patient or authorized representative who has indicated his/her understanding and acceptance.     Plan Discussed with:   Anesthesia Plan Comments:         Anesthesia Quick Evaluation

## 2016-07-14 NOTE — Anesthesia Procedure Notes (Signed)
Epidural Patient location during procedure: OB  Staffing Anesthesiologist: Cagney Steenson Performed: anesthesiologist   Preanesthetic Checklist Completed: patient identified, site marked, surgical consent, pre-op evaluation, timeout performed, IV checked, risks and benefits discussed and monitors and equipment checked  Epidural Patient position: sitting Prep: DuraPrep Patient monitoring: heart rate, continuous pulse ox and blood pressure Approach: right paramedian Location: L3-L4 Injection technique: LOR saline  Needle:  Needle type: Tuohy  Needle gauge: 17 G Needle length: 9 cm and 9 Needle insertion depth: 6 cm Catheter type: closed end flexible Catheter size: 20 Guage Catheter at skin depth: 10 cm Test dose: negative  Assessment Events: blood not aspirated, injection not painful, no injection resistance, negative IV test and no paresthesia  Additional Notes Patient identified. Risks/Benefits/Options discussed with patient including but not limited to bleeding, infection, nerve damage, paralysis, failed block, incomplete pain control, headache, blood pressure changes, nausea, vomiting, reactions to medication both or allergic, itching and postpartum back pain. Confirmed with bedside nurse the patient's most recent platelet count. Confirmed with patient that they are not currently taking any anticoagulation, have any bleeding history or any family history of bleeding disorders. Patient expressed understanding and wished to proceed. All questions were answered. Sterile technique was used throughout the entire procedure. Please see nursing notes for vital signs. Test dose was given through epidural needle and negative prior to continuing to dose epidural or start infusion. Warning signs of high block given to the patient including shortness of breath, tingling/numbness in hands, complete motor block, or any concerning symptoms with instructions to call for help. Patient was given  instructions on fall risk and not to get out of bed. All questions and concerns addressed with instructions to call with any issues.     

## 2016-07-14 NOTE — Anesthesia Postprocedure Evaluation (Signed)
Anesthesia Post Note  Patient: [place] L Chilton  Procedure(s) Performed: * No procedures listed *  Patient location during evaluation: Mother Baby Anesthesia Type: Epidural Level of consciousness: awake and alert and oriented Pain management: satisfactory to patient Vital Signs Assessment: post-procedure vital signs reviewed and stable Respiratory status: spontaneous breathing and nonlabored ventilation Cardiovascular status: stable Postop Assessment: no headache, no backache, no signs of nausea or vomiting, adequate PO intake and patient able to bend at knees (patient up walking) Anesthetic complications: no        Last Vitals:  Vitals:   07/14/16 1615 07/14/16 1655  BP:  136/80  Pulse:  84  Resp: 16 16  Temp:  36.7 C    Last Pain:  Vitals:   07/14/16 1430  TempSrc:   PainSc: 0-No pain   Pain Goal: Patients Stated Pain Goal: 1 (07/14/16 0002)               Madison Hickman

## 2016-07-14 NOTE — Anesthesia Postprocedure Evaluation (Signed)
Anesthesia Post Note  Patient: Rebekah Smith  Procedure(s) Performed: * No procedures listed *  Patient location during evaluation: L&D Anesthesia Type: Epidural Level of consciousness: awake, awake and alert, oriented and patient cooperative Pain management: pain level controlled Respiratory status: spontaneous breathing Cardiovascular status: blood pressure returned to baseline Postop Assessment: no headache, no backache, no signs of nausea or vomiting and patient able to bend at knees Anesthetic complications: no        Last Vitals:  Vitals:   07/14/16 1202 07/14/16 1218  BP: 132/89 140/86  Pulse: 88 83  Resp: 20 18  Temp:      Last Pain:  Vitals:   07/14/16 1129  TempSrc: Oral  PainSc:    Pain Goal: Patients Stated Pain Goal: 1 (07/14/16 0002)               Gwynne Edinger

## 2016-07-14 NOTE — Anesthesia Pain Management Evaluation Note (Signed)
  CRNA Pain Management Visit Note  Patient: Rebekah Smith, 30 y.o., female  "Hello I am a member of the anesthesia team at [facility]. We have an anesthesia team available at all times to provide care throughout the hospital, including epidural management and anesthesia for C-section. I don't know your plan for the delivery whether it a natural birth, water birth, IV sedation, nitrous supplementation, doula or epidural, but we want to meet your pain goals."   1.Was your pain managed to your expectations on prior hospitalizations?   Yes   2.What is your expectation for pain management during this hospitalization?     Epidural and IV pain meds  3.How can we help you reach that goal? Be available  Record the patient's initial score and the patient's pain goal.   Pain: 3  Pain Goal: 5 [facility] wants you to be able to say your pain was always managed very well.  [facility] 07/14/2016

## 2016-07-14 NOTE — Addendum Note (Signed)
Addendum  created 07/14/16 1801 by Shanon Payor, CRNA   Charge Capture section accepted, Sign clinical note

## 2016-07-15 LAB — CBC
HCT: 29.5 % — ABNORMAL LOW (ref 36.0–46.0)
HEMATOCRIT: 31.2 % — AB (ref 36.0–46.0)
HEMOGLOBIN: 10.8 g/dL — AB (ref 12.0–15.0)
Hemoglobin: 10.3 g/dL — ABNORMAL LOW (ref 12.0–15.0)
MCH: 31.5 pg (ref 26.0–34.0)
MCH: 31.5 pg (ref 26.0–34.0)
MCHC: 34.9 g/dL (ref 30.0–36.0)
MCHC: 34.6 g/dL (ref 30.0–36.0)
MCV: 90.2 fL (ref 78.0–100.0)
MCV: 91 fL (ref 78.0–100.0)
Platelets: 133 10*3/uL — ABNORMAL LOW (ref 150–400)
Platelets: 152 10*3/uL (ref 150–400)
RBC: 3.27 MIL/uL — AB (ref 3.87–5.11)
RBC: 3.43 MIL/uL — AB (ref 3.87–5.11)
RDW: 13.8 % (ref 11.5–15.5)
RDW: 13.9 % (ref 11.5–15.5)
WBC: 8.2 10*3/uL (ref 4.0–10.5)
WBC: 9 10*3/uL (ref 4.0–10.5)

## 2016-07-15 MED ORDER — AMLODIPINE BESYLATE 5 MG PO TABS
5.0000 mg | ORAL_TABLET | Freq: Every day | ORAL | Status: DC
  Administered 2016-07-15 – 2016-07-16 (×2): 5 mg via ORAL
  Filled 2016-07-15 (×2): qty 1

## 2016-07-15 MED ORDER — LACTATED RINGERS IV SOLN: INTRAVENOUS | Status: DC

## 2016-07-15 NOTE — Progress Notes (Signed)
Post Partum Day 1 Subjective: no complaints, up ad lib, voiding and tolerating PO  Objective: Blood pressure 111/69, pulse 70, temperature 97.6 F (36.4 C), temperature source Oral, resp. rate 16, height  (1.702 m), weight 195 lb (88.5 kg), last menstrual period 11/12/2015, SpO2 98 %, unknown if currently breastfeeding.  Physical Exam:  General: alert, cooperative and no distress Lochia: appropriate Uterine Fundus: firm Incision: n/a DVT Evaluation: No evidence of DVT seen on physical exam.   Recent Labs  07/14/16 1231 07/15/16 0502  HGB 12.0 10.3*  HCT 35.1* 29.5*    Assessment/Plan: Plan for discharge tomorrow  Pre eclampsia MGSO4 off at 1120 will start norvasc  q a.m.   LOS: 2 days   Rebekah Smith 07/15/2016, 9:22 AM

## 2016-07-15 NOTE — Lactation Note (Signed)
This note was copied from a baby's chart. Lactation Consultation Note  Patient Name: Rebekah Smith Today's Date: 07/15/2016  Patient is pumping every 3 hours and obtaining a few mls of colostrum.  Nipples are swollen so 30 mm flanges given to try with next pumping.  Encouraged to call with concerns/assist.   Maternal Data    Feeding Feeding Type: Formula Length of feed: 30 min  LATCH Score/Interventions                      Lactation Tools Discussed/Used     Consult Status      [name] 07/15/2016, 12:34 PM

## 2016-07-15 NOTE — Progress Notes (Signed)
Post Partum Day 1 Subjective: Pt felt like she was going to pass out.  This got her scared.  Better now with Rn and myself at bedside.  Objective: Blood pressure 131/85, pulse 63, temperature 98.5 F (36.9 C), temperature source Oral, resp. rate 16, height  (1.702 m), weight 195 lb (88.5 kg), last menstrual period 11/12/2015, SpO2 99 %, unknown if currently breastfeeding.  Physical Exam:  General: alert, cooperative and no distress Lochia: appropriate Uterine Fundus: firm Incision: n/a DVT Evaluation: No evidence of DVT seen on physical exam. Negative Homan's sign. No cords or calf tenderness. No significant calf/ankle edema.   Recent Labs  07/14/16 1231 07/15/16 0502  HGB 12.0 10.3*  HCT 35.1* 29.5*    Assessment/Plan: 1-Pt hasn't eaten since breakfast.  Pt encouraged to eat dinner 2-Othostatics 3-CBC 4-Pt seems table righ tnow.  Will continue to monitor.  Increase fluids.   LOS: 2 days   Elsie Lincoln 07/15/2016, 7:49 PM

## 2016-07-16 MED ORDER — AMLODIPINE BESYLATE 5 MG PO TABS: 5.0000 mg | ORAL_TABLET | Freq: Every day | ORAL | 1 refills | Status: DC

## 2016-07-16 NOTE — Discharge Summary (Signed)
OB Discharge Summary  Patient Name: Rebekah Smith DOB: 1986-03-03 MRN: [id number]  Date of admission: 07/13/2016 Delivering MD: Michaele Offer   Date of discharge: 07/16/2016  Admitting diagnosis: 35w labored breathing, headache, hbp Intrauterine pregnancy: [redacted]w[redacted]d     Secondary diagnosis:Active Problems:   Preeclampsia, severe  Additional problems: Postpartum hemorrhage, acute blood loss anemia     Discharge diagnosis: Preterm Pregnancy Delivered, Preeclampsia (severe), Anemia and PPH                                                                     Post partum procedures:Magnesium Sulfate x 24 hours  Augmentation: AROM and Pitocin  Complications: Hemorrhage>1010mL  Hospital course:  Induction of Labor With Vaginal Delivery   31 y.o. yo [id number] at [redacted]w[redacted]d was admitted to the hospital 07/13/2016 for induction of labor.  Indication for induction: Preeclampsia.  Patient had an uncomplicated labor course as follows: Membrane Rupture Time/Date: 11:04 AM ,07/14/2016   Intrapartum Procedures: Episiotomy: None [1]                                         Lacerations:  None [1]  Patient had delivery of a Viable infant.  Information for the patient's newborn:  Nahima, Ales [[id number]]  Delivery Method: Vaginal, Spontaneous Delivery (Filed from Delivery Summary)   07/14/2016  Details of delivery can be found in separate delivery note.  Patient had a routine postpartum course. Patient is discharged home 07/16/16.  Physical exam  Vitals:   07/15/16 1949 07/15/16 2120 07/15/16 2332 07/16/16 0419  BP: (!) 144/92  125/75 107/63  Pulse: (!) 106 66 63 (!) 58  Resp: (!) Temp:   97.9 F (36.6 C) 98.3 F (36.8 C)  TempSrc:    Oral  SpO2: 100% 100% 98% 100%  Weight:      Height:       General: alert, cooperative and no distress Lochia: appropriate Uterine Fundus: firm DVT Evaluation: No evidence of DVT seen on physical exam. Labs: Lab Results   Component Value Date   WBC 9.0 07/15/2016   HGB 10.8 (L) 07/15/2016   HCT 31.2 (L) 07/15/2016   MCV 91.0 07/15/2016   PLT 152 07/15/2016   CMP Latest Ref Rng & Units 07/13/2016  Glucose 65 - 99 mg/dL 79  BUN 7 - 25 mg/dL 6(L)  Creatinine 6.96 - 1.10 mg/dL 2.95  Sodium 284 - 132 mmol/L 137  Potassium 3.5 - 5.3 mmol/L 3.6  Chloride 98 - 110 mmol/L 105  CO2 20 - 31 mmol/L 22  Calcium 8.6 - 10.2 mg/dL 8.1(L)  Total Protein 6.1 - 8.1 g/dL 4.4(W)  Total Bilirubin 0.2 - 1.2 mg/dL 0.5  Alkaline Phos 33 - 115 U/L 80  AST 10 - 30 U/L 14  ALT 6 - 29 U/L 7    Discharge instruction: per After Visit Summary and "Baby and Me Booklet".  After Visit Meds:  Allergies as of 07/16/2016   No Known Allergies     Medication List    STOP taking these medications   amoxicillin 875 MG tablet Commonly known as:  AMOXIL   aspirin EC 81 MG tablet   labetalol 200 MG tablet Commonly known as:  NORMODYNE     TAKE these medications   acetaminophen 325 MG tablet Commonly known as:  TYLENOL Take 325 mg by mouth every 6 (six) hours as needed.   amLODipine 5 MG tablet Commonly known as:  NORVASC Take 1 tablet (5 mg total) by mouth daily.   Butalbital-APAP-Caffeine 50-325-40 MG capsule Take 1-2 capsules by mouth every 6 (six) hours as needed for headache.   PRENATAL VITAMINS PO Take 1 tablet by mouth daily.       Diet: low salt diet  Activity: Advance as tolerated. Pelvic rest for 6 weeks.   Outpatient follow up:2 weeks Follow up Appt:Future Appointments Date Time Provider Department Center  07/17/2016 9:00 AM [facility] NURSE [facility]  07/24/2016 11:00 AM [facility] [place] 3 [facility]  07/28/2016 12:00 AM [facility] ROOM [facility] None   Follow up visit: No Follow-up on file.  Postpartum contraception: Tubal Ligation  Newborn Data: Live born female  Birth Weight: 7 lb 4.4 oz (3300 g) APGAR: 9, 9  Baby Feeding: Bottle and Breast Disposition:NICU   07/16/2016 Reva Bores, MD

## 2016-07-16 NOTE — Progress Notes (Signed)
   PRENATAL VISIT NOTE  Subjective:  Rebekah Smith is a 31 y.o. [phone] at 70w6dbeing seen today for ongoing prenatal care.  She is currently monitored for the following issues for this high-risk pregnancy and has History of postpartum hemorrhage; History of preeclampsia in prior pregnancy, currently pregnant; Supervision of high risk pregnancy, antepartum; Kell isoimmunization during pregnancy; Gestational hypertension without significant proteinuria; Group B streptococcal bacteriuria; and Preeclampsia, severe on her problem list.  Patient reports occasional contractions, SOB in am sometimes. None now. Denies HA, vision changes, epigastric pain, cough, fever, chills, leg pain, hemoptysis. C/O occasional rapid heartbeat. No palpitations Pt very anxious. Is single mother taking care of 4 children. Mother is supportive, present at appt today. FOB is not supportive.  Contractions: Irregular. Vag. Bleeding: None.  Movement: Present. Denies leaking of fluid.   The following portions of the patient's history were reviewed and updated as appropriate: allergies, current medications, past family history, past medical history, past social history, past surgical history and problem list. Problem list updated.  Objective:   Vitals:   07/13/16 1007 07/13/16 1043  BP: 128/87   Pulse: (!) 114 90  Weight: 195 lb (88.5 kg)   RR 14  Fetal Status: Fetal Heart Rate (bpm): NST Fundal Height: 37 cm Movement: Present  Presentation: Vertex  General:  Alert, oriented and cooperative. Patient is in no acute distress. Mildly anxious  Skin: Skin is warm and dry. No rash noted.   Cardiovascular: Normal heart mildly tachycardic upon arrival, but 90's w/ restrate noted  Respiratory: Normal respiratory effort, no problems with respiration noted. CTAB.   Abdomen: Soft, gravid, appropriate for gestational age. Pain/Pressure: Present     Pelvic:  Cervical exam performed Dilation: 1 Effacement (%): 0 Station: -3    Extremities: Normal range of motion.  Edema: Trace, Neg Homan's, No cords, erythema,a or tenderness.   Mental Status: Anxioust. Normal behavior. Normal judgment and thought content.   Assessment and Plan:  Pregnancy: [id number] 320w0d1. Gestational hypertension without significant proteinuria in third trimester vs Pre-E w/out severe features--No evidence of Pre-E   - Offered to send pt to MAU now vs repeating Pre-E labs in office. Pt doesn't feel like she needs to go to MAU. - CBC - Comp Met (CMET) - Protein / creatinine ratio, urine  2. SOB (shortness of breath) Uncertain etiology. Anxiety and likely LGA baby may be causing Sx. Occurs for a short time after waking up. Not persistent or associated w/ infectious or cardiac Sx. No dizziness, CP, cough. Minimal leg swelling. Low suspicion for Pulmonary Embolism or Edema.   - Offered MAU eval. Pt not having Sx now. Declines.  - PE, Pulm Edema, etc precautions  3. Supervision of high risk pregnancy, antepartum   4. Group B streptococcal bacteriuria - PCN in labor  Preterm labor symptoms and general obstetric precautions including but not limited to vaginal bleeding, contractions, leaking of fluid and fetal movement were reviewed in detail with the patient. Please refer to After Visit Summary for other counseling recommendations.  Return for  twice weekly testing.  Plan delivery at 37 weeks or sooner as needed if pt develops Pre-E w/ severe features.    ViManya SilvasCNM

## 2016-07-16 NOTE — Discharge Instructions (Signed)
Home Care Instructions for Mom ° ACTIVITY °· Gradually return to your regular activities. °· Let yourself rest. Nap while your baby sleeps. °· Avoid lifting anything that is heavier than 10 lb (4.5 kg) until your health care provider says it is okay. °· Avoid activities that take a lot of effort and energy (are strenuous) until approved by your health care provider. Walking at a slow-to-moderate pace is usually safe. °· If you had a cesarean delivery: °¨ Do not vacuum, climb stairs, or drive a car for 4-6 weeks. °¨ Have someone help you at home until you feel like you can do your usual activities yourself. °¨ Do exercises as told by your health care provider, if this applies. °VAGINAL BLEEDING °You may continue to bleed for 4-6 weeks after delivery. Over time, the amount of blood usually decreases and the color of the blood usually gets lighter. However, the flow of bright red blood may increase if you have been too active. If you need to use more than one pad in an hour because your pad gets soaked, or if you pass a large clot: °· Lie down. °· Raise your feet. °· Place a cold compress on your lower abdomen. °· Rest. °· Call your health care provider. °If you are breastfeeding, your period should return anytime between 8 weeks after delivery and the time that you stop breastfeeding. If you are not breastfeeding, your period should return 6-8 weeks after delivery. °PERINEAL CARE °The perineal area, or perineum, is the part of your body between your thighs. After delivery, this area needs special care. Follow these instructions as told by your health care provider. °· Take warm tub baths for 15-20 minutes. °· Use medicated pads and pain-relieving sprays and creams as told. °· Do not use tampons or douches until vaginal bleeding has stopped. °· Each time you go to the bathroom: °¨ Use a peri bottle. °¨ Change your pad. °¨ Use towelettes in place of toilet paper until your stitches have healed. °· Do Kegel exercises  every day. Kegel exercises help to maintain the muscles that support the vagina, bladder, and bowels. You can do these exercises while you are standing, sitting, or lying down. To do Kegel exercises: °¨ Tighten the muscles of your abdomen and the muscles that surround your birth canal. °¨ Hold for a few seconds. °¨ Relax. °¨ Repeat until you have done this 5 times in a row. °· To prevent hemorrhoids from developing or getting worse: °¨ Drink enough fluid to keep your urine clear or pale yellow. °¨ Avoid straining when having a bowel movement. °¨ Take over-the-counter medicines and stool softeners as told by your health care provider. °BREAST CARE °· Wear a tight-fitting bra. °· Avoid taking over-the-counter pain medicine for breast discomfort. °· Apply ice to the breasts to help with discomfort as needed: °¨ Put ice in a plastic bag. °¨ Place a towel between your skin and the bag. °¨ Leave the ice on for 20 minutes or as told by your health care provider. °NUTRITION °· Eat a well-balanced diet. °· Do not try to lose weight quickly by cutting back on calories. °· Take your prenatal vitamins until your postpartum checkup or until your health care provider tells you to stop. °POSTPARTUM DEPRESSION °You may find yourself crying for no apparent reason and unable to cope with all of the changes that come with having a newborn. This mood is called postpartum depression. Postpartum depression happens because your hormone levels change after delivery.   If you have postpartum depression, get support from your partner, friends, and family. If the depression does not go away on its own after several weeks, contact your health care provider. BREAST SELF-EXAM Do a breast self-exam each month, at the same time of the month. If you are breastfeeding, check your breasts just after a feeding, when your breasts are less full. If you are breastfeeding and your period has started, check your breasts on day 5, 6, or 7 of your  period. Report any lumps, bumps, or discharge to your health care provider. Know that breasts are normally lumpy if you are breastfeeding. This is temporary, and it is not a health risk. INTIMACY AND SEXUALITY Avoid sexual activity for at least 3-4 weeks after delivery or until the brownish-red vaginal flow is completely gone. If you want to avoid pregnancy, use some form of birth control. You can get pregnant after delivery, even if you have not had your period. SEEK MEDICAL CARE IF:  You feel unable to cope with the changes that a child brings to your life, and these feelings do not go away after several weeks.  You notice a lump, a bump, or discharge on your breast. SEEK IMMEDIATE MEDICAL CARE IF:  Blood soaks your pad in 1 hour or less.  You have:  Severe pain or cramping in your lower abdomen.  A bad-smelling vaginal discharge.  A fever that is not controlled by medicine.  A fever, and an area of your breast is red and sore.  Pain or redness in your calf.  Sudden, severe chest pain.  Shortness of breath.  Painful or bloody urination.  Problems with your vision.  You vomit for 12 hours or longer.  You develop a severe headache.  You have serious thoughts about hurting yourself, your child, or anyone else. This information is not intended to replace advice given to you by your health care provider. Make sure you discuss any questions you have with your health care provider. Document Released: 03/16/2000 Document Revised: 08/25/2015 Document Reviewed: 09/20/2014 Elsevier Interactive Patient Education  2017 [employer] Preeclampsia and Eclampsia Preeclampsia is a serious condition that develops only during pregnancy. It is also called toxemia of pregnancy. This condition causes high blood pressure along with other symptoms, such as swelling and headaches. These symptoms may develop as the condition gets worse. Preeclampsia may occur at 20 weeks of pregnancy or  later. Diagnosing and treating preeclampsia early is very important. If not treated early, it can cause serious problems for you and your baby. One problem it can lead to is eclampsia, which is a condition that causes muscle jerking or shaking (convulsions or seizures) in the mother. Delivering your baby is the best treatment for preeclampsia or eclampsia. Preeclampsia and eclampsia symptoms usually go away after your baby is born. What are the causes? The cause of preeclampsia is not known. What increases the risk? The following risk factors make you more likely to develop preeclampsia:  Being pregnant for the first time.  Having had preeclampsia during a past pregnancy.  Having a family history of preeclampsia.  Having high blood pressure.  Being pregnant with twins or triplets.  Being 57 or older.  Being African-American.  Having kidney disease or diabetes.  Having medical conditions such as lupus or blood diseases.  Being very overweight (obese). What are the signs or symptoms? The earliest signs of preeclampsia are:  High blood pressure.  Increased protein in your urine. Your health care provider will check  for this at every visit before you give birth (prenatal visit). Other symptoms that may develop as the condition gets worse include:  Severe headaches.  Sudden weight gain.  Swelling of the hands, face, legs, and feet.  Nausea and vomiting.  Vision problems, such as blurred or double vision.  Numbness in the face, arms, legs, and feet.  Urinating less than usual.  Dizziness.  Slurred speech.  Abdominal pain, especially upper abdominal pain.  Convulsions or seizures. Symptoms generally go away after giving birth. How is this diagnosed? There are no screening tests for preeclampsia. Your health care provider will ask you about symptoms and check for signs of preeclampsia during your prenatal visits. You may also have tests that include:  Urine  tests.  Blood tests.  Checking your blood pressure.  Monitoring your babys heart rate.  Ultrasound. How is this treated? You and your health care provider will determine the treatment approach that is best for you. Treatment may include:  Having more frequent prenatal exams to check for signs of preeclampsia, if you have an increased risk for preeclampsia.  Bed rest.  Reducing how much salt (sodium) you eat.  Medicine to lower your blood pressure.  Staying in the hospital, if your condition is severe. There, treatment will focus on controlling your blood pressure and the amount of fluids in your body (fluid retention).  You may need to take medicine (magnesium sulfate) to prevent seizures. This medicine may be given as an injection or through an IV tube.  Delivering your baby early, if your condition gets worse. You may have your labor started with medicine (induced), or you may have a cesarean delivery. Follow these instructions at home: Eating and drinking    Drink enough fluid to keep your urine clear or pale yellow.  Eat a healthy diet that is low in sodium. Do not add salt to your food. Check nutrition labels to see how much sodium a food or beverage contains.  Avoid caffeine. Lifestyle   Do not use any products that contain nicotine or tobacco, such as cigarettes and e-cigarettes. If you need help quitting, ask your health care provider.  Do not use alcohol or drugs.  Avoid stress as much as possible. Rest and get plenty of sleep. General instructions   Take over-the-counter and prescription medicines only as told by your health care provider.  When lying down, lie on your side. This keeps pressure off of your baby.  When sitting or lying down, raise (elevate) your feet. Try putting some pillows underneath your lower legs.  Exercise regularly. Ask your health care provider what kinds of exercise are best for you.  Keep all follow-up and prenatal visits as  told by your health care provider. This is important. How is this prevented? To prevent preeclampsia or eclampsia from developing during another pregnancy:  Get proper medical care during pregnancy. Your health care provider may be able to prevent preeclampsia or diagnose and treat it early.  Your health care provider may have you take a low-dose aspirin or a calcium supplement during your next pregnancy.  You may have tests of your blood pressure and kidney function after giving birth.  Maintain a healthy weight. Ask your health care provider for help managing weight gain during pregnancy.  Work with your health care provider to manage any long-term (chronic) health conditions you have, such as diabetes or kidney problems. Contact a health care provider if:  You gain more weight than expected.  You have  headaches.  You have nausea or vomiting.  You have abdominal pain.  You feel dizzy or light-headed. Get help right away if:  You develop sudden or severe swelling anywhere in your body. This usually happens in the legs.  You gain 5 lbs (2.3 kg) or more during one week.  You have severe:  Abdominal pain.  Headaches.  Dizziness.  Vision problems.  Confusion.  Nausea or vomiting.  You have a seizure.  You have trouble moving any part of your body.  You develop numbness in any part of your body.  You have trouble speaking.  You have any abnormal bleeding.  You pass out. This information is not intended to replace advice given to you by your health care provider. Make sure you discuss any questions you have with your health care provider. Document Released: 03/16/2000 Document Revised: 11/15/2015 Document Reviewed: 10/24/2015 Elsevier Interactive Patient Education  2017 [employer].

## 2016-07-16 NOTE — Progress Notes (Signed)
Pt discharged to home with her father.  Condition stable.  Reviewed antihypertensive medication for home and symptoms of hypertension that pt should report to care provider.  Pt able to list symptoms for RN.  Pt ambulated to NICU with plans to leave hospital from there.

## 2016-07-16 NOTE — Lactation Note (Signed)
This note was copied from a baby's chart. Lactation Consultation Note  Patient Name: Boy Sarayah Bacchi ZOXWR'U Date: 07/16/2016 Reason for consult: Follow-up assessment  NICU baby 11 hours old. Mom reports that she is getting 7-10 ml of EBM now when she pumps. Enc mom to keep pumping every 2-3 hours for a total of at least 8 times/24 hours followed by hand expression. Mom reports that she pumped with last/3rd child--a 33-week GA that was also in the NICU. Mom reports that she pumped and bottle-fed the older child--and did some nursing, for the first year of life. Mom states that she had an excellent supply of milk with the older child.   Enc mom to continue to offer STS as she and baby able. Mom wanting to latch baby as soon as baby able, but she understands that the baby is having respiratory issues at this times. Enc mom to call for assistance as needed. Mom states that she has a DEBP from [facility] at home that she used with her last child.   Maternal Data    Feeding Feeding Type: Formula Length of feed: 60 min  LATCH Score/Interventions                      Lactation Tools Discussed/Used Pump Review: Setup, frequency, and cleaning;Milk Storage Initiated by:: Bedside RN. Date initiated:: 07/14/16   Consult Status Consult Status: Follow-up Date: 07/17/16 Follow-up type: In-patient    Sherlyn Hay 07/16/2016, 11:16 AM

## 2016-07-17 ENCOUNTER — Other Ambulatory Visit: Payer: Medicaid Other

## 2016-07-17 ENCOUNTER — Ambulatory Visit: Payer: Self-pay

## 2016-07-17 LAB — TYPE AND SCREEN
ABO/RH(D): O POS
Antibody Screen: POSITIVE
DAT, IGG: NEGATIVE
DONOR AG TYPE: NEGATIVE
DONOR AG TYPE: NEGATIVE
Donor AG Type: NEGATIVE
UNIT DIVISION: 0
Unit division: 0
Unit division: 0

## 2016-07-17 LAB — BPAM RBC
BLOOD PRODUCT EXPIRATION DATE: 201805012359
Blood Product Expiration Date: 201805102359
Blood Product Expiration Date: 201805102359
Unit Type and Rh: 5100
Unit Type and Rh: 5100
Unit Type and Rh: 5100

## 2016-07-17 NOTE — Addendum Note (Signed)
Addendum  created 07/17/16 1145 by Phillips Grout, MD   Anesthesia Staff edited

## 2016-07-17 NOTE — Lactation Note (Signed)
This note was copied from a baby's chart. Lactation Consultation Note  Patient Name: Rebekah Smith ZOXWR'U Date: 07/17/2016 Reason for consult: Follow-up assessment  Mom engorged, and both breasts are tight and not flowing well when mom pumps. Mom reports that she has had engorgement with each of her children, and has had mastitis and an abscess in left breast that was drained. Mom reports that baby able to latch to right breast and nurse, but mom states that the left breast is not flowing well. Mom's left nipple is larger, so mom using #36 flange on left breast and #27 while pumping. Assisted mom with icing, massaging, manual pump and hand pump. Mom able to collect about an ounce of EBM total--more from right than left. Mom given cabbage leaves and enc to use now until the next pumping session. Enc mom pump at the baby's bedside, and brought in a DEBP. Mom given comfort gels with review to use in between the rest of today's pumping sessions. Mom reports increased comfort with all interventions. Enc mom to set an alarm when sleeping, and to try to keep milk moving and breasts as soft as possible.   Maternal Data    Feeding Feeding Type: Breast Milk with Formula added Length of feed: 60 min  LATCH Score/Interventions Latch: Too sleepy or reluctant, no latch achieved, no sucking elicited.  Audible Swallowing: A few with stimulation  Type of Nipple: Flat  Comfort (Breast/Nipple): Engorged, cracked, bleeding, large blisters, severe discomfort Problem noted: Engorgment Intervention(s): Ice     Hold (Positioning): Assistance needed to correctly position infant at breast and maintain latch. Intervention(s): Support Pillows  LATCH Score: 3  Lactation Tools Discussed/Used     Consult Status Consult Status: PRN    Sherlyn Hay 07/17/2016, 2:57 PM

## 2016-07-18 ENCOUNTER — Ambulatory Visit: Payer: Self-pay

## 2016-07-18 NOTE — Lactation Note (Addendum)
This note was copied from a baby's chart. Lactation Consultation Note  Patient Name: Rebekah Smith ZOXWR'U Date: 07/18/2016   NICU 86 days old. Mom reports that pumping while baby STS made her milk flow easily. Mom states that she is feeling much better today and baby was able to latch on both breasts earlier. Mom states that she was able to pump through the night and brought in about 12 ounces of EBM to NICU today. Enc mom to call for assistance as needed.   Maternal Data    Feeding Feeding Type: Breast Fed Length of feed: 60 min  LATCH Score/Interventions                      Lactation Tools Discussed/Used     Consult Status      Sherlyn Hay 07/18/2016, 3:24 PM

## 2016-07-23 ENCOUNTER — Telehealth (HOSPITAL_COMMUNITY): Payer: Self-pay | Admitting: *Deleted

## 2016-07-23 NOTE — Telephone Encounter (Signed)
Preadmission screen  

## 2016-07-24 ENCOUNTER — Encounter (HOSPITAL_COMMUNITY): Payer: Self-pay

## 2016-07-24 ENCOUNTER — Ambulatory Visit: Payer: Self-pay

## 2016-07-24 ENCOUNTER — Ambulatory Visit (HOSPITAL_COMMUNITY)
Admission: RE | Admit: 2016-07-24 | Discharge: 2016-07-24 | Disposition: A | Discharge status: Home / Self Care | Payer: Medicaid Other | Source: Ambulatory Visit | Attending: Advanced Practice Midwife | Admitting: Advanced Practice Midwife

## 2016-07-24 NOTE — Lactation Note (Signed)
This note was copied from a baby's chart. Lactation Consultation Note  Patient Name: Rebekah Smith Date: 07/24/2016  Met with mom in the NICU.  She states her milk supply is good.  Mom putting baby to breast without problems.  Encouraged to call with concerns/assist prn.   Maternal Data    Feeding Feeding Type: Breast Milk Nipple Type: Slow - flow Length of feed: 35 min (30 min nipple and 5 min gavage)  LATCH Score/Interventions                      Lactation Tools Discussed/Used     Consult Status      Ave Filter 07/24/2016, 3:24 PM

## 2016-07-27 ENCOUNTER — Ambulatory Visit: Payer: Self-pay

## 2016-07-27 NOTE — Lactation Note (Signed)
This note was copied from a baby's chart. Lactation Consultation Note  With this mom of a NICU baby, now 27 days old and 69 6/7 weeks CGA. Mom forgot her pump parts today, so I gave her a new kit to use.   Patient Name: Rebekah Smith UDTHY'H Date: 07/27/2016     Maternal Data    Feeding Feeding Type: Breast Milk Nipple Type: Slow - flow Length of feed: 10 min  LATCH Score/Interventions                      Lactation Tools Discussed/Used     Consult Status      Tonna Corner 07/27/2016, 6:01 PM

## 2016-07-28 ENCOUNTER — Inpatient Hospital Stay (HOSPITAL_COMMUNITY): Admission: RE | Admit: 2016-07-28 | Payer: Medicaid Other | Source: Ambulatory Visit

## 2016-07-30 ENCOUNTER — Ambulatory Visit (INDEPENDENT_AMBULATORY_CARE_PROVIDER_SITE_OTHER): Payer: Medicaid Other | Admitting: Obstetrics & Gynecology

## 2016-07-30 ENCOUNTER — Encounter: Payer: Self-pay | Admitting: Obstetrics & Gynecology

## 2016-07-30 VITALS — BP 128/86 | HR 84 | Ht 67.0 in | Wt 170.0 lb

## 2016-07-30 DIAGNOSIS — R55 Syncope and collapse: Secondary | ICD-10-CM

## 2016-07-30 MED ORDER — ALPRAZOLAM 0.5 MG PO TABS: ORAL_TABLET | ORAL | 0 refills | Status: DC

## 2016-07-30 MED ORDER — DICLOXACILLIN SODIUM 500 MG PO CAPS: 500.0000 mg | ORAL_CAPSULE | Freq: Four times a day (QID) | ORAL | 0 refills | Status: DC

## 2016-07-30 NOTE — Progress Notes (Signed)
   Subjective:    Patient ID: Rebekah Smith, female    DOB: 01/19/1986, 31 y.o.   MRN: [id number]  HPI Pt presents to discuss interval BTL.  Papers signed.  Knows risks and alternatives.  Pt having mastitis symptoms.  Rigors, aches chills.  Took left over amox and felt better.  +Breast pain and redness.  Pt also c/o anxiety over BTL.  Pt had one syncopal episode on Fridiay morning.  She is still taking Procardia.  Home BPs are 107/60s.    Review of Systems  Constitutional: Negative.   Respiratory: Negative.   Cardiovascular: Negative.   Gastrointestinal: Negative.   Genitourinary: Negative.   Neurological: Positive for headaches.  Psychiatric/Behavioral: Positive for self-injury.       Objective:   Physical Exam  Constitutional: She is oriented to person, place, and time. She appears well-developed and well-nourished. No distress.  HENT:  Head: Normocephalic and atraumatic.  Eyes: Conjunctivae are normal.  Pulmonary/Chest: Effort normal.  Abdominal: Soft. Bowel sounds are normal. She exhibits no distension and no mass. There is no tenderness. There is no rebound and no guarding.  Musculoskeletal: She exhibits no edema.  Neurological: She is alert and oriented to person, place, and time.  Skin: Skin is warm and dry.  Psychiatric: She has a normal mood and affect.  Vitals reviewed.  Breasts:  Left breast with hard area at 12 o'clock with redness; +pain.  Vitals:   07/30/16 1342  BP: 128/86  Pulse: 84  Weight: 170 lb (77.1 kg)  Height:  (1.702 m)    Assessment & Plan:  31 yo female with  1.  Mastitis--start diclox quid; breast feeding tips on handling overproduction 2.  Syncope--increase fluids; stop procardia; take home BPS.  Reprot if high 3.  BTL interval with filshie 4.  Xanax at time of surgery for anxiety 5.  Check cbc

## 2016-07-31 LAB — CBC
HEMATOCRIT: 37.7 % (ref 35.0–45.0)
HEMOGLOBIN: 12.8 g/dL (ref 11.7–15.5)
MCH: 30.2 pg (ref 27.0–33.0)
MCHC: 34 g/dL (ref 32.0–36.0)
MCV: 88.9 fL (ref 80.0–100.0)
MPV: 10.6 fL (ref 7.5–12.5)
Platelets: 298 10*3/uL (ref 140–400)
RBC: 4.24 MIL/uL (ref 3.80–5.10)
RDW: 12.8 % (ref 11.0–15.0)
WBC: 8.6 10*3/uL (ref 3.8–10.8)

## 2016-08-01 ENCOUNTER — Telehealth: Payer: Self-pay

## 2016-08-01 NOTE — Telephone Encounter (Signed)
Left message on pt's phone letting her know that her blood work was normal and there are no signs of anemia

## 2016-08-07 ENCOUNTER — Encounter (HOSPITAL_COMMUNITY): Payer: Self-pay

## 2016-08-21 ENCOUNTER — Other Ambulatory Visit (HOSPITAL_COMMUNITY)
Admission: RE | Admit: 2016-08-21 | Discharge: 2016-08-21 | Disposition: A | Discharge status: Home / Self Care | Payer: Medicaid Other | Source: Ambulatory Visit | Attending: Obstetrics & Gynecology | Admitting: Obstetrics & Gynecology

## 2016-08-21 ENCOUNTER — Ambulatory Visit (INDEPENDENT_AMBULATORY_CARE_PROVIDER_SITE_OTHER): Payer: Medicaid Other | Admitting: Obstetrics & Gynecology

## 2016-08-21 ENCOUNTER — Encounter: Payer: Self-pay | Admitting: Obstetrics & Gynecology

## 2016-08-21 VITALS — BP 122/84 | HR 77 | Wt 167.0 lb

## 2016-08-21 DIAGNOSIS — N898 Other specified noninflammatory disorders of vagina: Secondary | ICD-10-CM

## 2016-08-21 DIAGNOSIS — Z0000 Encounter for general adult medical examination without abnormal findings: Secondary | ICD-10-CM

## 2016-08-21 MED ORDER — NORETHINDRONE 0.35 MG PO TABS: 1.0000 | ORAL_TABLET | Freq: Every day | ORAL | 11 refills | Status: DC

## 2016-08-21 NOTE — Addendum Note (Signed)
Addended by: Kathie DikeSOLA, DEANNA J on: 08/21/2016 02:20 PM   Modules accepted: Orders

## 2016-08-21 NOTE — Progress Notes (Signed)
Post Partum Exam  Rebekah Smith is a 31 y.o. [phone] female who presents for a postpartum visit. She is 5 weeks postpartum following a spontaneous vaginal delivery. I have fully reviewed the prenatal and intrapartum course. The delivery was at 35 gestational weeks.  Anesthesia: epidural. Postpartum course has been unremarkable. Baby's course has been unremarkable. Baby is feeding by breast. Bleeding no bleeding. Bowel function is normal. Bladder function is normal. Patient is not sexually active. Contraception method is tubal ligation. Postpartum depression screening:neg (score 0). She still wants a BTL and will start Micronor now. She is aware of the 80% efficacy rate. Condoms also advised prn.  The following portions of the patient's history were reviewed and updated as appropriate: allergies, current medications, past family history, past medical history, past social history, past surgical history and problem list.  Review of Systems Pertinent items are noted in HPI.    Objective:  currently breastfeeding.  General:  alert   Breasts:  inspection negative, no nipple discharge or bleeding, no masses or nodularity palpable  Lungs: clear to auscultation bilaterally  Heart:  regular rate and rhythm, S1, S2 normal, no murmur, click, rub or gallop  Abdomen: soft, non-tender; bowel sounds normal; no masses,  no organomegaly   Vulva:  normal  Vagina: normal vagina  Cervix:  anteverted  Corpus: normal  Adnexa:  no mass, fullness, tenderness  Rectal Exam: Not performed.        Assessment:    Normal postpartum exam. Pap smear done at today's visit.   Plan:   1. Contraception: oral progesterone-only contraceptive 2. I sent Rhea BleacherJacinda a message to schedule a BTL.

## 2016-08-23 LAB — CERVICOVAGINAL ANCILLARY ONLY
BACTERIAL VAGINITIS: NEGATIVE
Candida vaginitis: NEGATIVE

## 2016-08-24 LAB — CYTOLOGY - PAP
Diagnosis: NEGATIVE
HPV (WINDOPATH): NOT DETECTED

## 2016-08-31 ENCOUNTER — Ambulatory Visit: Admit: 2016-08-31 | Payer: Medicaid Other | Admitting: Obstetrics & Gynecology

## 2016-08-31 SURGERY — LIGATION, FALLOPIAN TUBE, LAPAROSCOPIC
Anesthesia: Choice | Laterality: Bilateral

## 2016-09-12 ENCOUNTER — Encounter (HOSPITAL_COMMUNITY): Payer: Self-pay

## 2016-10-16 ENCOUNTER — Other Ambulatory Visit: Payer: Medicaid Other | Admitting: *Deleted

## 2016-10-22 ENCOUNTER — Other Ambulatory Visit (HOSPITAL_COMMUNITY)
Admission: RE | Admit: 2016-10-22 | Discharge: 2016-10-22 | Disposition: A | Discharge status: Home / Self Care | Payer: Medicaid Other | Source: Ambulatory Visit | Attending: Obstetrics & Gynecology | Admitting: Obstetrics & Gynecology

## 2016-10-22 ENCOUNTER — Other Ambulatory Visit (INDEPENDENT_AMBULATORY_CARE_PROVIDER_SITE_OTHER): Payer: Medicaid Other | Admitting: *Deleted

## 2016-10-22 ENCOUNTER — Other Ambulatory Visit: Payer: Medicaid Other | Admitting: *Deleted

## 2016-10-22 DIAGNOSIS — R102 Pelvic and perineal pain: Secondary | ICD-10-CM

## 2016-10-22 DIAGNOSIS — R350 Frequency of micturition: Secondary | ICD-10-CM | POA: Diagnosis present

## 2016-10-22 LAB — POCT URINALYSIS DIPSTICK
Bilirubin, UA: NEGATIVE
Glucose, UA: NEGATIVE
KETONES UA: NEGATIVE
Leukocytes, UA: NEGATIVE
PH UA: 7 (ref 5.0–8.0)
PROTEIN UA: NEGATIVE
RBC UA: NEGATIVE
SPEC GRAV UA: 1.025 (ref 1.010–1.025)
UROBILINOGEN UA: NEGATIVE U/dL — AB

## 2016-10-22 NOTE — Progress Notes (Signed)
Pt here with c/o's urinary pain and frequency.  She has noticed this for 2 weeks and took Azo which she states made it feel better.  Urine Dip was unremarkable.  Vaginal swab and urine culture sent.

## 2016-10-24 ENCOUNTER — Telehealth: Payer: Self-pay | Admitting: *Deleted

## 2016-10-24 DIAGNOSIS — N390 Urinary tract infection, site not specified: Secondary | ICD-10-CM

## 2016-10-24 LAB — CULTURE, URINE COMPREHENSIVE

## 2016-10-24 LAB — CERVICOVAGINAL ANCILLARY ONLY
BACTERIAL VAGINITIS: NEGATIVE
CANDIDA VAGINITIS: NEGATIVE

## 2016-10-24 MED ORDER — FLUCONAZOLE 150 MG PO TABS: 150.0000 mg | ORAL_TABLET | Freq: Once | ORAL | 0 refills | Status: AC

## 2016-10-24 MED ORDER — AMPICILLIN 500 MG PO CAPS: 500.0000 mg | ORAL_CAPSULE | Freq: Four times a day (QID) | ORAL | 0 refills | Status: DC

## 2016-10-24 NOTE — Telephone Encounter (Signed)
Pt notified of UTI and [facility] Dr Marice Potterove.

## 2016-11-19 ENCOUNTER — Inpatient Hospital Stay (HOSPITAL_COMMUNITY)
Admission: RE | Admit: 2016-11-19 | Discharge: 2016-11-19 | Disposition: A | Discharge status: Home / Self Care | Payer: Medicaid Other | Source: Ambulatory Visit

## 2016-11-19 NOTE — Patient Instructions (Signed)
Your procedure is scheduled on:  Tuesday, Aug. 28, 2018  Enter through the [employer] [facility] at:  8:30 AM  Pick up the phone at the desk and dial [phone].  Call this number if you have problems the morning of surgery: [phone].  Remember: Do NOT eat food or drink after:  Midnight Monday  Take these medicines the morning of surgery with a SIP OF WATER:  Xanax  Stop ALL herbal medications at this time  Do NOT smoke the day of surgery.  Do NOT wear jewelry (body piercing), metal hair clips/bobby pins, make-up, artifical eyelashes or nail polish. Do NOT wear lotions, powders, or perfumes.  You may wear deodorant. Do NOT shave for 48 hours prior to surgery. Do NOT bring valuables to the hospital. Contacts, dentures, or bridgework may not be worn into surgery.  Have a responsible adult drive you home and stay with you for 24 hours after your procedure  Bring a copy of your healthcare power of attorney and living will documents.

## 2016-11-19 NOTE — Patient Instructions (Signed)
Your procedure is scheduled on:  Tuesday, Aug. 28, 2018  Enter through the Main Entr[facility] at:  8:30 AM  Pick up the phone at the desk and dial [phone].  Call this number if you have problems the morning of surgery: [phone].  Remember: Do NOT eat food or drink after:  Midnight Monday  Take these medicines the morning of surgery with a SIP OF WATER:  Xanax  Stop ALL herbal medications at this time  Do NOT smoke the day of surgery.  Do NOT wear jewelry (body piercing), metal hair clips/bobby pins, make-up, artifical eyelashes or nail polish. Do NOT wear lotions, powders, or perfumes.  You may wear deodorant. Do NOT shave for 48 hours prior to surgery. Do NOT bring valuables to the hospital. Contacts, dentures, or bridgework may not be worn into surgery.  Have a responsible adult drive you home and stay with you for 24 hours after your procedure  Bring a copy of your healthcare power of attorney and living will documents.   

## 2016-11-22 ENCOUNTER — Inpatient Hospital Stay (HOSPITAL_COMMUNITY)
Admission: RE | Admit: 2016-11-22 | Discharge: 2016-11-22 | Disposition: A | Discharge status: Home / Self Care | Payer: Medicaid Other | Source: Ambulatory Visit

## 2016-11-27 ENCOUNTER — Encounter (HOSPITAL_COMMUNITY): Admission: RE | Payer: Self-pay | Source: Ambulatory Visit

## 2016-11-27 ENCOUNTER — Ambulatory Visit (HOSPITAL_COMMUNITY)
Admission: RE | Admit: 2016-11-27 | Payer: Medicaid Other | Source: Ambulatory Visit | Admitting: Obstetrics & Gynecology

## 2016-11-27 SURGERY — LIGATION, FALLOPIAN TUBE, LAPAROSCOPIC
Anesthesia: Choice | Laterality: Bilateral

## 2017-01-23 ENCOUNTER — Telehealth: Payer: Self-pay | Admitting: *Deleted

## 2017-01-23 DIAGNOSIS — B373 Candidiasis of vulva and vagina: Secondary | ICD-10-CM

## 2017-01-23 DIAGNOSIS — B3731 Acute candidiasis of vulva and vagina: Secondary | ICD-10-CM

## 2017-01-23 MED ORDER — FLUCONAZOLE 150 MG PO TABS: 150.0000 mg | ORAL_TABLET | Freq: Once | ORAL | 0 refills | Status: AC

## 2017-01-23 NOTE — Telephone Encounter (Signed)
Pt called stating that she has a yeast infection and is requesting a RX for D[facility]lucan be [facility] to [facility].  Per TO Dr Marice Potterove approved.

## 2017-01-28 DIAGNOSIS — M21612 Bunion of left foot: Secondary | ICD-10-CM | POA: Insufficient documentation

## 2017-03-15 ENCOUNTER — Encounter: Payer: Self-pay | Admitting: *Deleted

## 2017-03-15 ENCOUNTER — Other Ambulatory Visit: Payer: Medicaid Other | Admitting: *Deleted

## 2017-03-15 DIAGNOSIS — N898 Other specified noninflammatory disorders of vagina: Secondary | ICD-10-CM

## 2017-03-16 LAB — WET PREP FOR TRICH, YEAST, CLUE
MICRO NUMBER: 81408274
SPECIMEN QUALITY 3963: ADEQUATE

## 2017-03-18 ENCOUNTER — Telehealth: Payer: Self-pay | Admitting: *Deleted

## 2017-03-18 NOTE — Telephone Encounter (Signed)
Attempted to reach pt via phone but her number has been disconnected.

## 2017-05-29 ENCOUNTER — Telehealth: Payer: Self-pay | Admitting: *Deleted

## 2017-05-29 MED ORDER — DICLOXACILLIN SODIUM 250 MG PO CAPS: 250.0000 mg | ORAL_CAPSULE | Freq: Four times a day (QID) | ORAL | 0 refills | Status: DC

## 2017-05-29 NOTE — Telephone Encounter (Signed)
Pt called stating that her baby is solely breast fed.  Yesterday she was away from him and he was not able to nurse her.  Last night her breast became engorged and she ran a fever of 102.  Now they are very tender and have red streaks.  Per protocol will send in a RX for Dicloxicillin to her pharmacy.  She will use warm packs to her breast and continue to breast feed.  She is advised that if not any better in 48 hours that she should be seen.

## 2017-07-19 ENCOUNTER — Telehealth (HOSPITAL_COMMUNITY): Payer: Self-pay | Admitting: Lactation Services

## 2017-07-19 NOTE — Telephone Encounter (Signed)
Patient returned call about engorgement (patient reports that her R breast is especially engorged). She has an abundant milk supply (she could pump 10-12 oz in 15 min). We discussed that with the abundance of her milk supply she needed to decrease her milk supply over a course of time. I advised that Mom pump for comfort (not to empty breasts), to use cabbage leaves (with crushed veins, wear until they wilt, and then replace), and to eat foods with sage (mom denies history of seizures). Patient is getting married in 2 weeks and was hoping her milk would be gone. I suggested that she call her MD to ask about any meds that she could take to decrease her milk supply.   Patient knows she is welcome to call back with any questions.  Glenetta HewKim Lameshia Hypolite, RN, IBCLC

## 2017-07-19 NOTE — Telephone Encounter (Signed)
Patient had left a message saying she was "very engorged" and wanted to speak with a Advertising copywriterlactation consultant. Patient had quit nursing her 30 year old yesterday and per her message, pediatrician had told her it was ok to quit "cold [place]key" instead of weaning.  Patient's call was returned. There was no answer, but I left a voice mail inviting Mom to call back.   Glenetta HewKim Kamylle Axelson, RN, IBCLC

## 2017-11-05 IMAGING — US US OB TRANSVAGINAL
1 series · 15 of 28 positions shown · non-contrast
Comparison: CT of the abdomen and pelvis performed earlier today at
18 p.m., and CT of the abdomen and pelvis from 12/20/2014

CLINICAL DATA: Acute onset of lower abdominal cramping. Initial
encounter.

EXAM:
OBSTETRIC <14 WK US AND TRANSVAGINAL OB US
TECHNIQUE: Both transabdominal and transvaginal ultrasound examinations were
performed for complete evaluation of the gestation as well as the
maternal uterus, adnexal regions, and pelvic cul-de-sac.
Transvaginal technique was performed to assess early pregnancy.

[Series 1: us ob transvaginal · 15 of 62 slices shown]
[im 1/62]
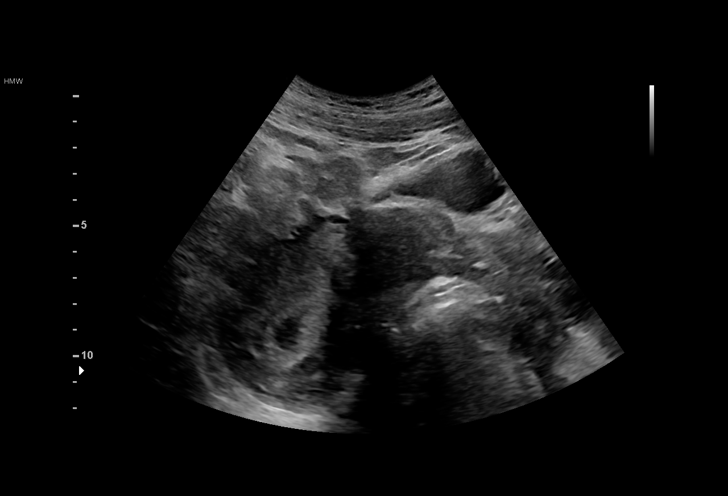
[im 5/62]
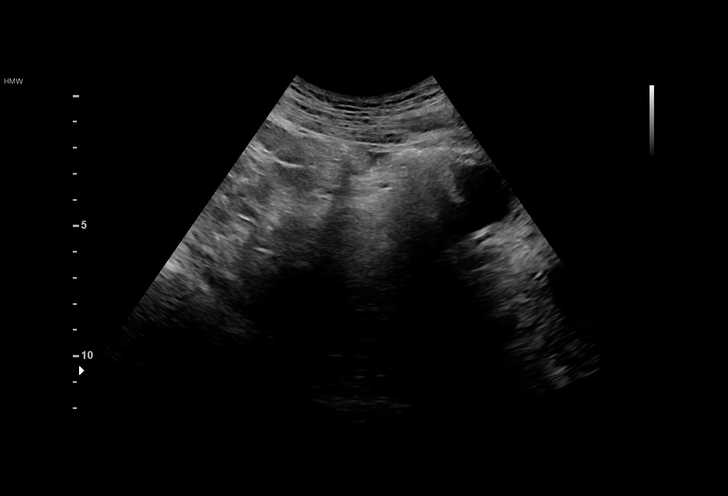
[im 10/62]
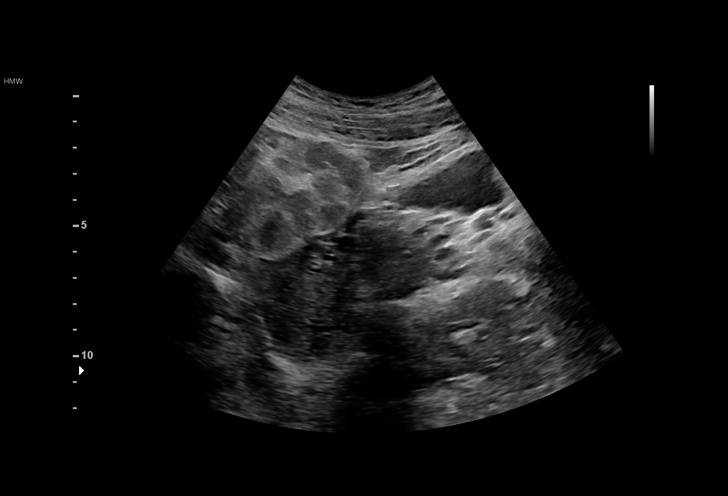
[im 14/62]
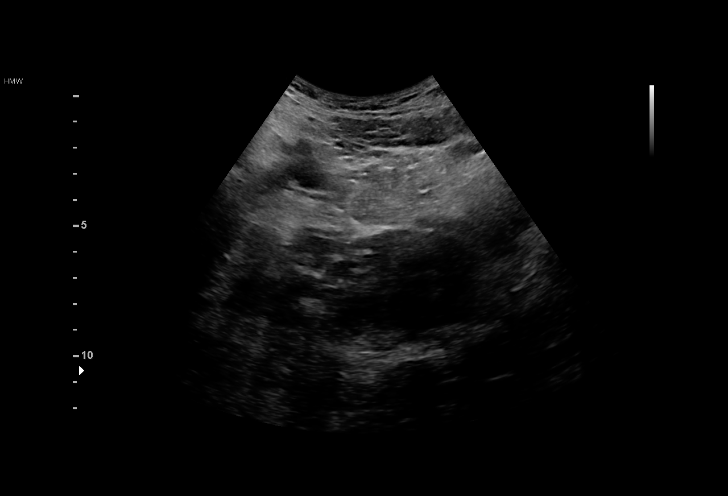
[im 19/62]
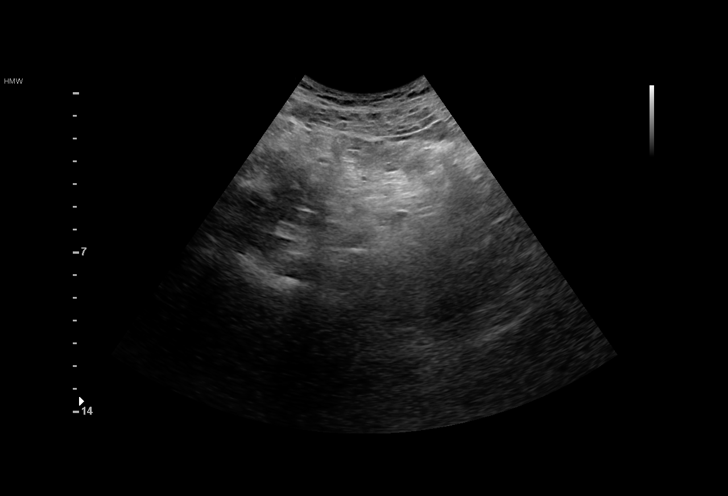
[im 23/62]
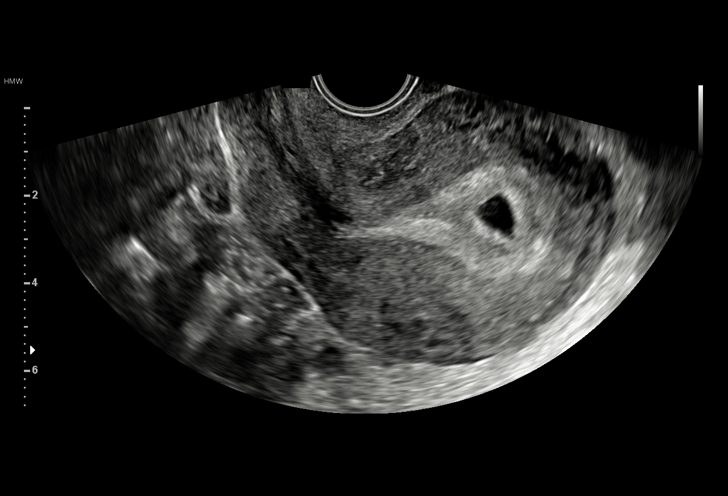
[im 28/62]
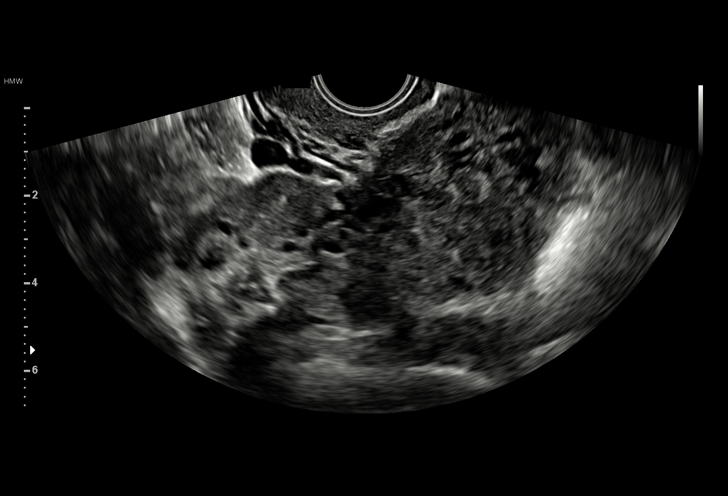
[im 32/62]
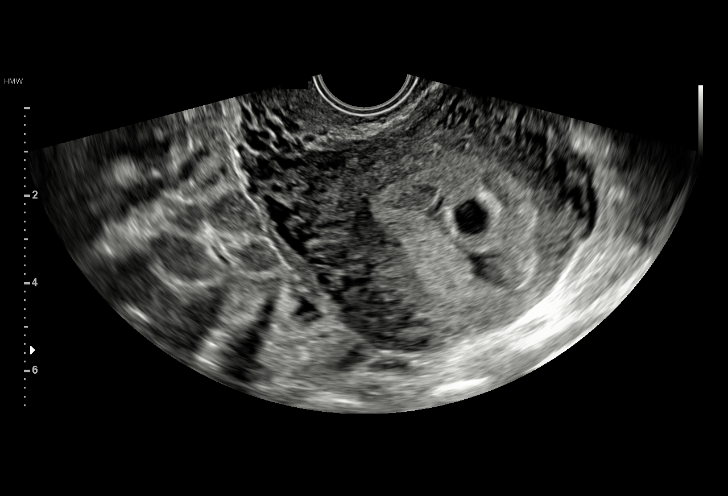
[im 34/62]
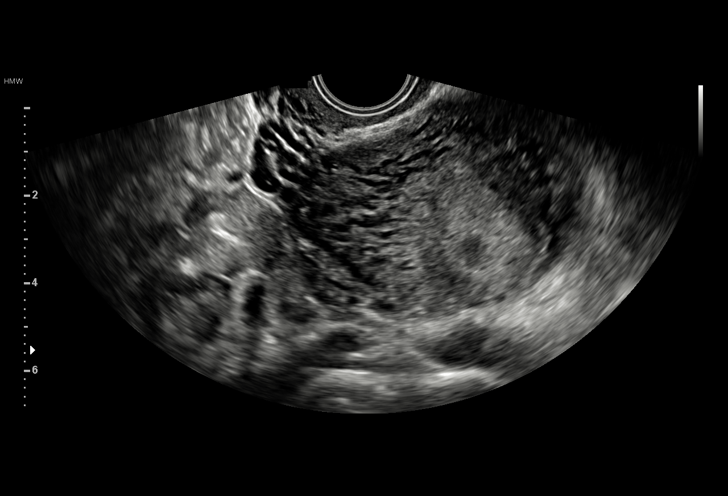
[im 39/62]
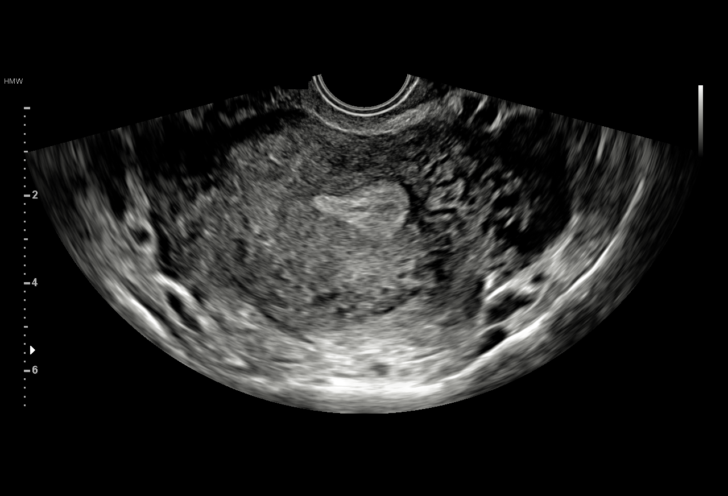
[im 43/62]
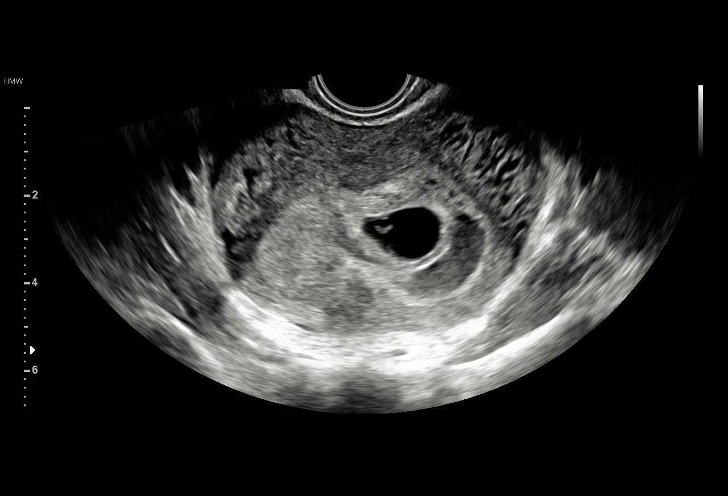
[im 48/62]
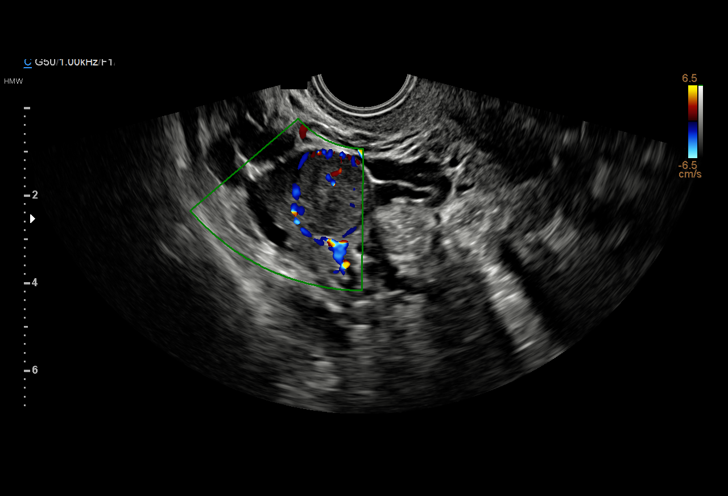
[im 52/62]
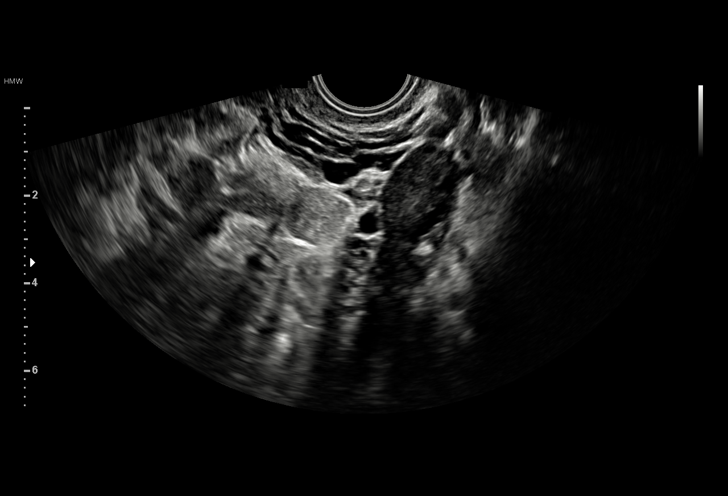
[im 57/62]
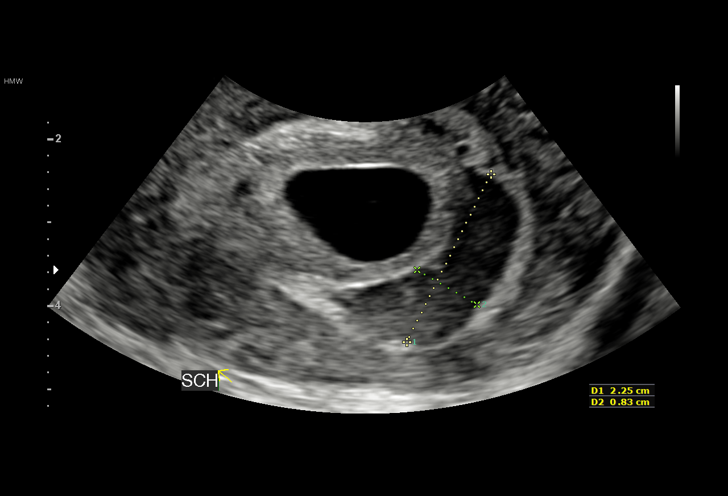
[im 62/62]
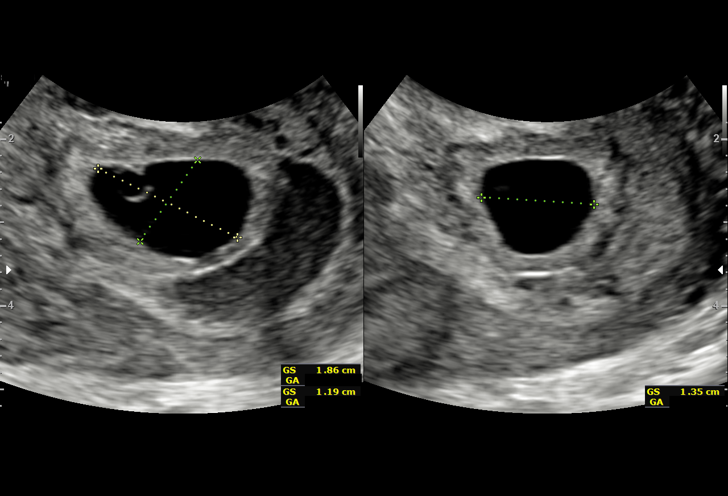

[15 of 28 positions shown; findings below may reference images not displayed]

FINDINGS: Intrauterine gestational sac: Single; visualized and normal in
shape.

Yolk sac:  Yes

Embryo:  No

Cardiac Activity: N/A

MSD: 14.7  mm   6 w   2  d

Subchorionic hemorrhage: A moderate to large amount of subchorionic
hemorrhage is noted.

Maternal uterus/adnexae: The uterus is otherwise unremarkable in
appearance.

The ovaries are within normal limits. The right ovary measures 3.4 x
3.1 x 2.8 cm, while the left ovary measures 3.3 x 1.8 x 2.4 cm. No
suspicious adnexal masses are seen; there is no evidence for ovarian
torsion.

No free fluid is seen within the pelvic cul-de-sac.
IMPRESSION: 1. Single live intrauterine pregnancy, with a mean sac diameter of
1.5 cm, corresponding to a gestational age of 6 weeks 2 days. This
matches the gestational age by LMP, and reflects an estimated date
of delivery August 18, 2016.
2. Moderate to large amount of subchorionic hemorrhage noted.

## 2018-04-09 IMAGING — US US MFM OB FOLLOW-UP
1 series · 14 of 28 positions shown · non-contrast
Comparison: none

[Series 1: us mfm ob follow-up · 43 acquisitions, 14 frames shown]
[im 2/43]
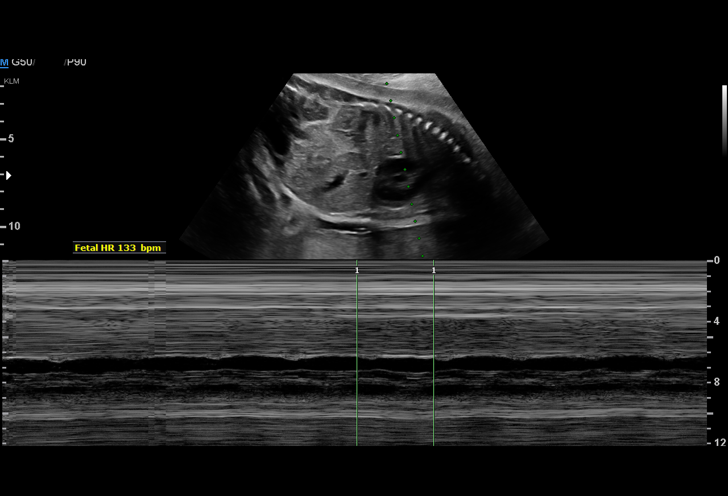
[im 5/43]
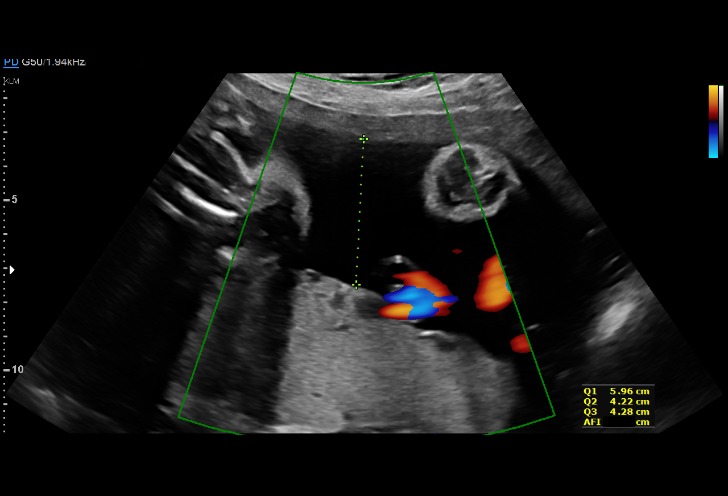
[im 8/43]
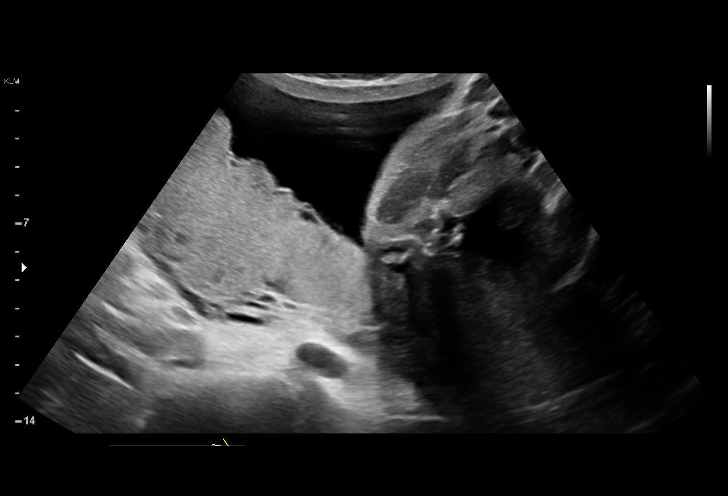
[im 11/43]
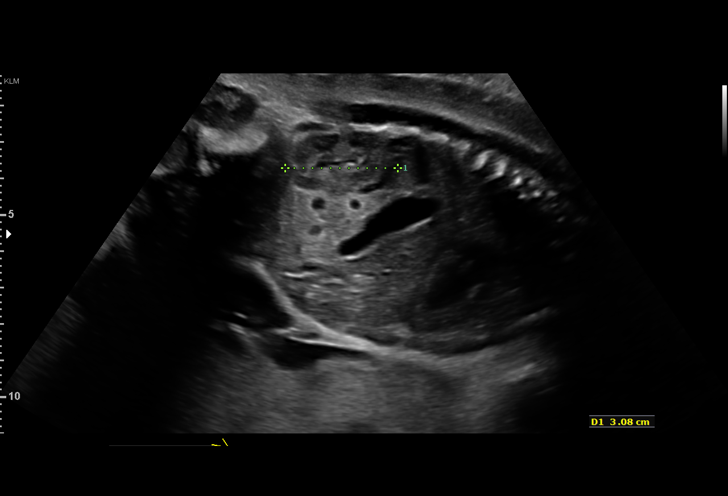
[im 15/43]
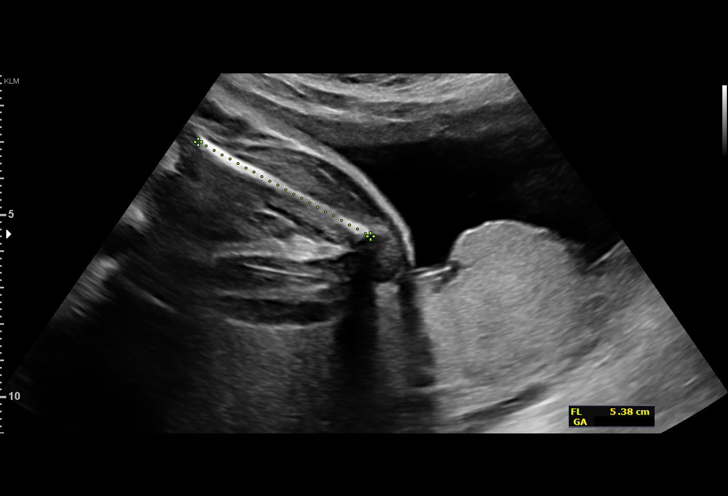
[im 18/43]
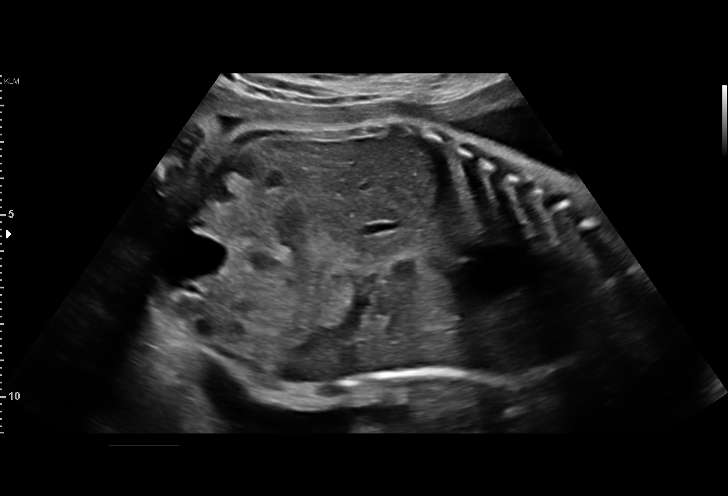
[im 21/43]
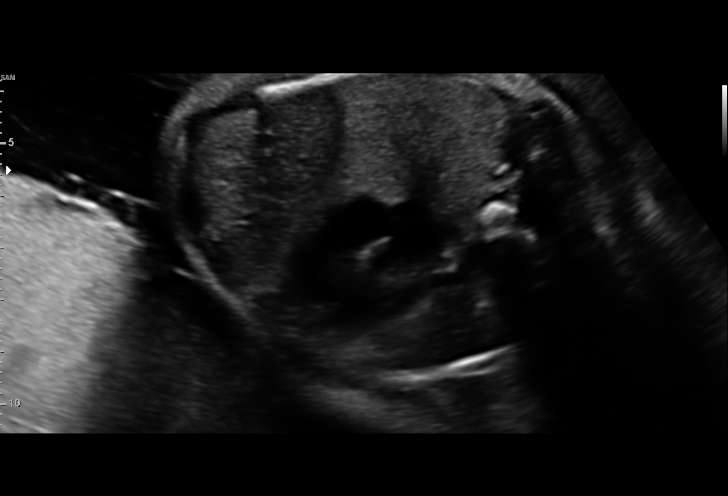
[im 24/43]
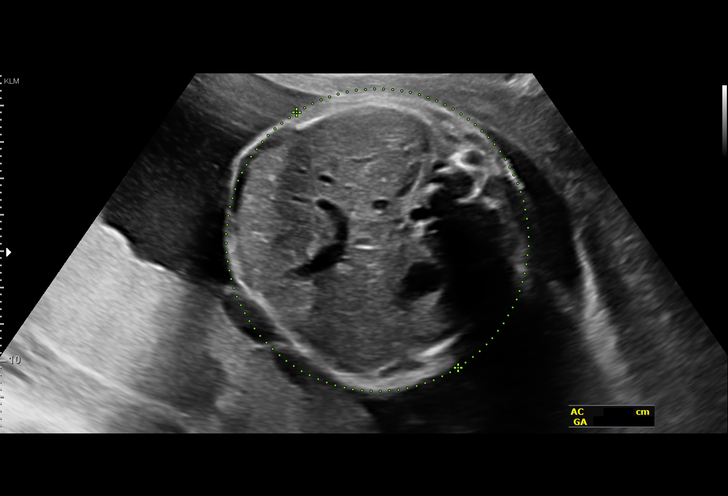
[im 27/43]
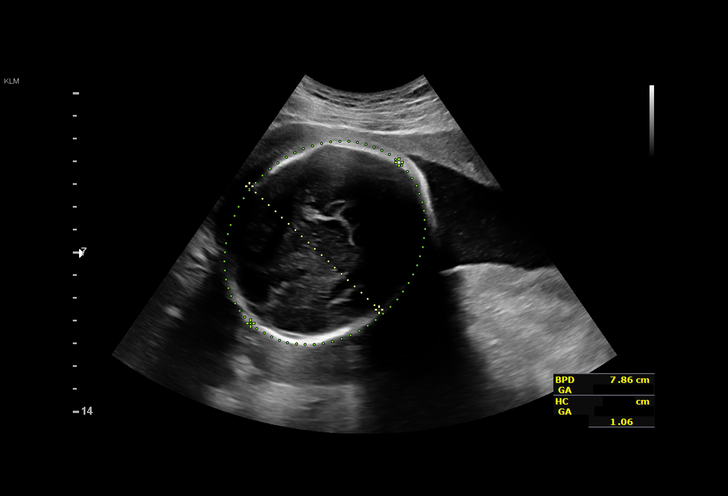
[im 30/43]
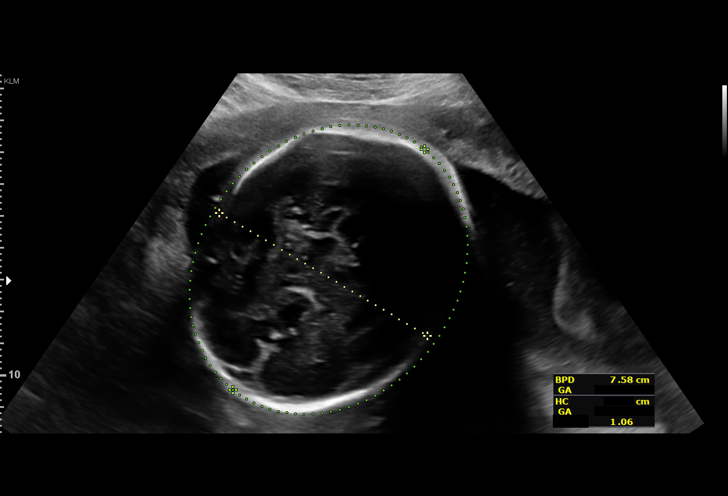
[im 33/43]
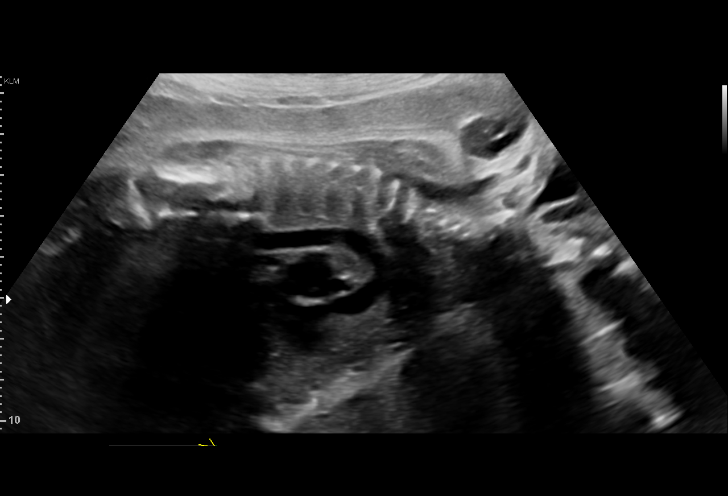
[im 36/43]
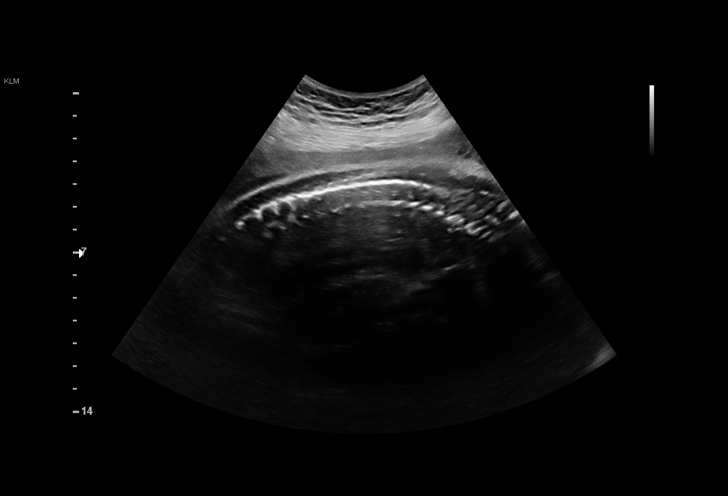
[im 39/43]
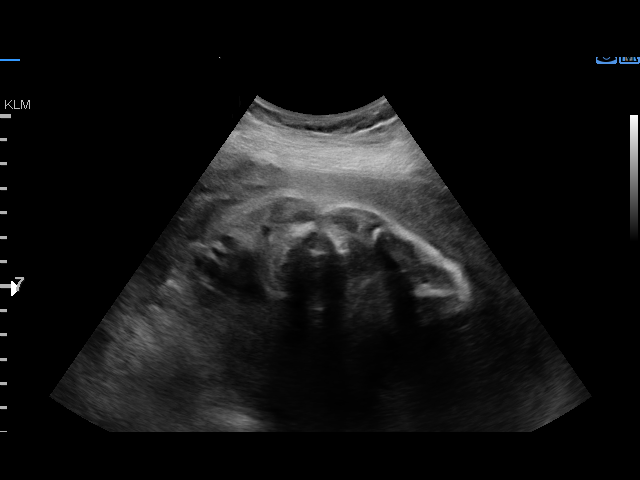
[im 43/43]
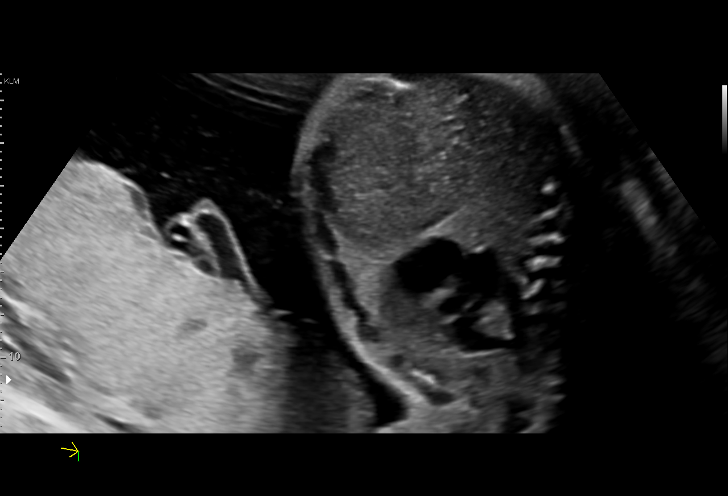

[14 of 28 positions shown; findings below may reference images not displayed]

1  BYUNG ILL BECKETT              [id number]      [id number]     [id number]
Indications

28 weeks gestation of pregnancy
Poor obstetric history: Previous
preeclampsia / eclampsia/gestational HTN-
aspirin
Damir isoimmunization during pregnancy in
third trimester, single or unspecified fetus:
FOB Damir antigen negative
OB History

Gravidity:    5         Term:   2        Prem:   1         SAB:   1
TOP:          0       Ectopic:  0        Living: 3
Fetal Evaluation

Num Of Fetuses:     1
Fetal Heart         133
Rate(bpm):
Cardiac Activity:   Observed
Presentation:       Cephalic
Placenta:           Posterior, above cervical os
P. Cord Insertion:  Previously Visualized

Amniotic Fluid
AFI FV:      Subjectively within normal limits

AFI Sum(cm)     %Tile       Largest Pocket(cm)
18.09           69

RUQ(cm)       RLQ(cm)       LUQ(cm)        LLQ(cm)
5.96
Biometry

BPD:      77.4  mm     G. Age:  31w 0d         97  %    CI:         77.02  %    70 - 86
FL/HC:       19.5  %    18.8 -
HC:      279.3  mm     G. Age:  30w 4d         81  %    HC/AC:       1.05       1.05 -
AC:      265.1  mm     G. Age:  30w 4d         93  %    FL/BPD:      70.5  %    71 - 87
FL:       54.6  mm     G. Age:  28w 6d         48  %    FL/AC:       20.6  %    20 - 24

Est. FW:    4148   gm     3 lb 5 oz     79  %
Gestational Age

LMP:           28w 3d        Date:  11/12/15                 EDD:    08/18/16
U/S Today:     30w 2d                                        EDD:    08/05/16
Best:          28w 3d     Det. By:  LMP  (11/12/15)          EDD:    08/18/16
Anatomy

Cranium:               Appears normal         Aortic Arch:            Appears normal
Cavum:                 Previously seen        Ductal Arch:            Appears normal
Ventricles:            Appears normal         Diaphragm:              Appears normal
Choroid Plexus:        Appears normal         Stomach:                Appears normal, left
sided
Cerebellum:            Previously seen        Abdomen:                Appears normal
Posterior Fossa:       Appears normal         Abdominal Wall:         Previously seen
Nuchal Fold:           Not applicable (>20    Cord Vessels:           Previously seen
wks GA)
Face:                  Orbits and profile     Kidneys:                Appear normal
previously seen
Lips:                  Previously seen        Bladder:                Appears normal
Thoracic:              Appears normal         Spine:                  Appears normal
Heart:                 Appears normal         Upper Extremities:      Previously seen
(4CH, axis, and situs
RVOT:                  Appears normal         Lower Extremities:      Previously seen
LVOT:                  Appears normal

Other:  Heels visualized. Fetus appears to be a male. Technically difficult due
to fetal position.
Cervix Uterus Adnexa

Cervix
Length:            4.1  cm.
Normal appearance by transabdominal scan.
Impression

SIUP at 28+3 weeks
Normal interval anatomy; anatomic survey complete
Normal amniotic fluid volume
Appropriate interval growth with EFW at the 79th %tile
Recommendations

Follow-up as clinically indicated

## 2018-04-23 IMAGING — US US MFM FETAL BPP W/O NON-STRESS
1 series · 15 of 28 positions shown · non-contrast
Comparison: none

[Series 1: us mfm fetal bpp w/o non-stress · 37 acquisitions, 15 frames shown]
[im 1/37]
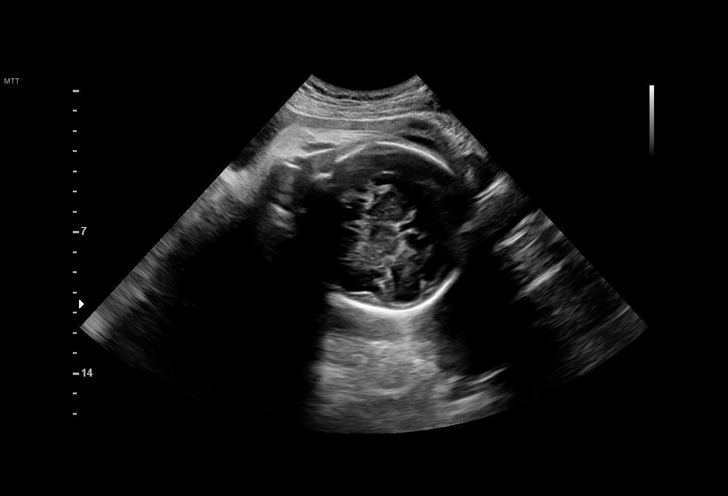
[im 3/37]
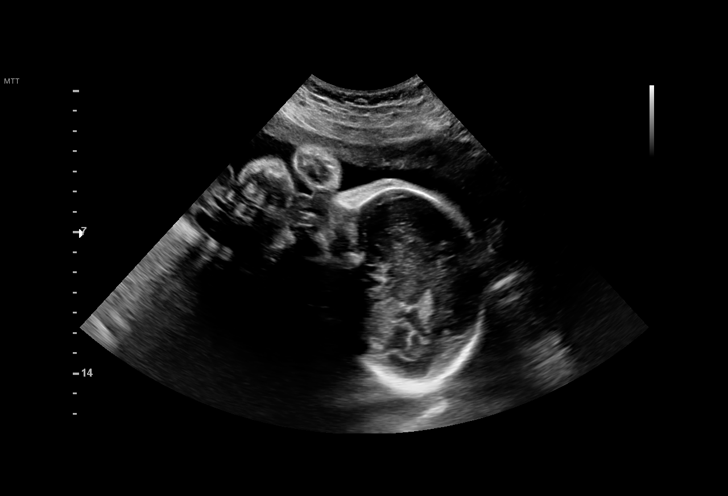
[im 6/37]
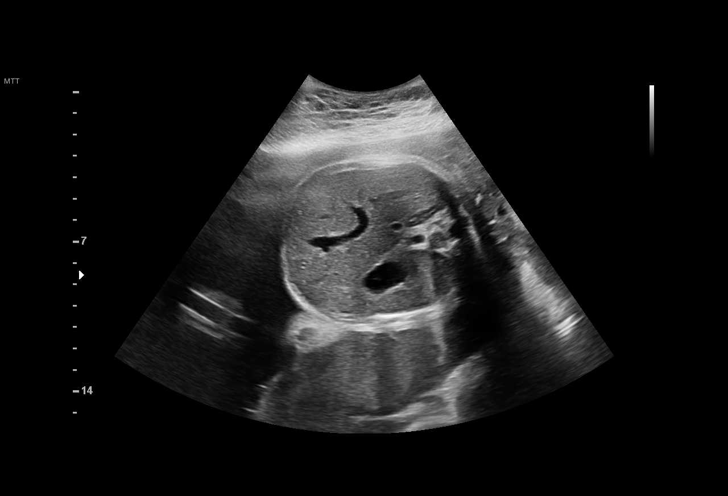
[im 9/37]
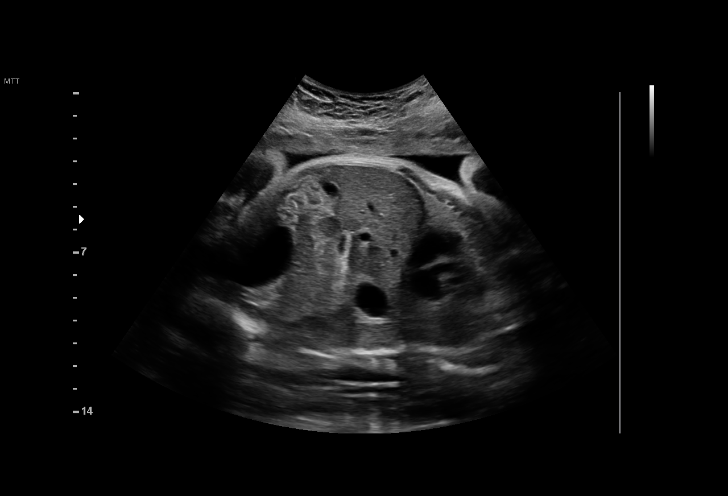
[im 11/37]
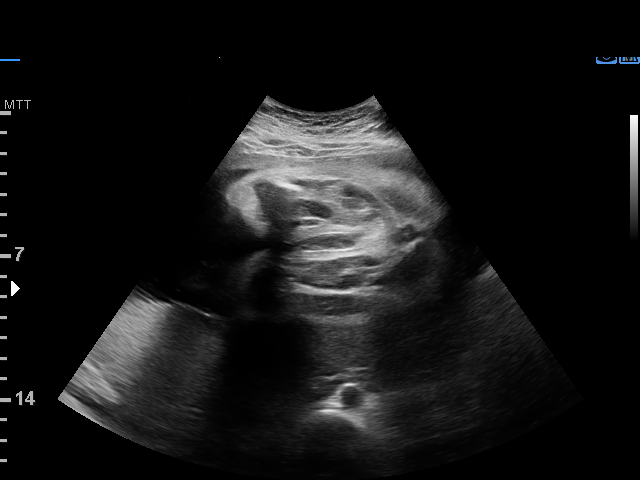
[im 14/37]
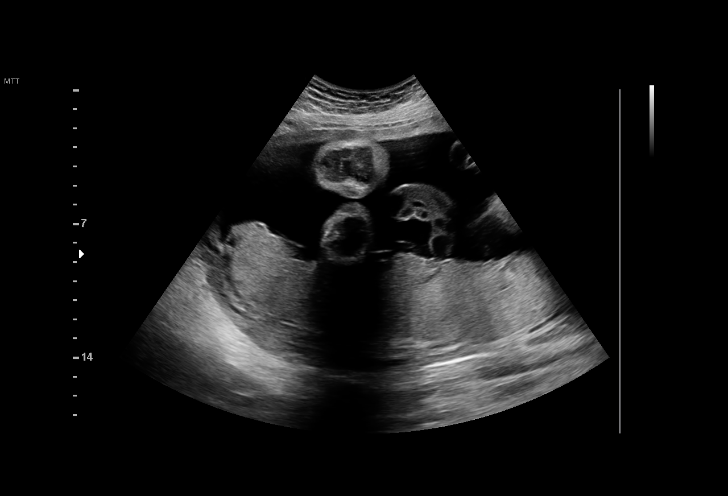
[im 17/37]
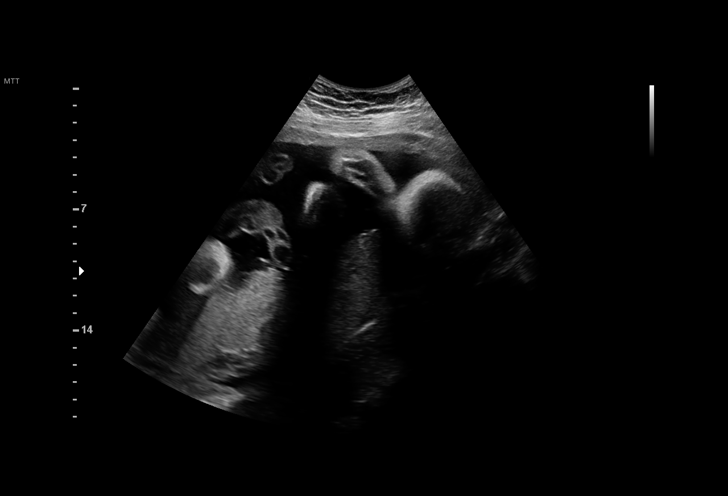
[im 19/37]
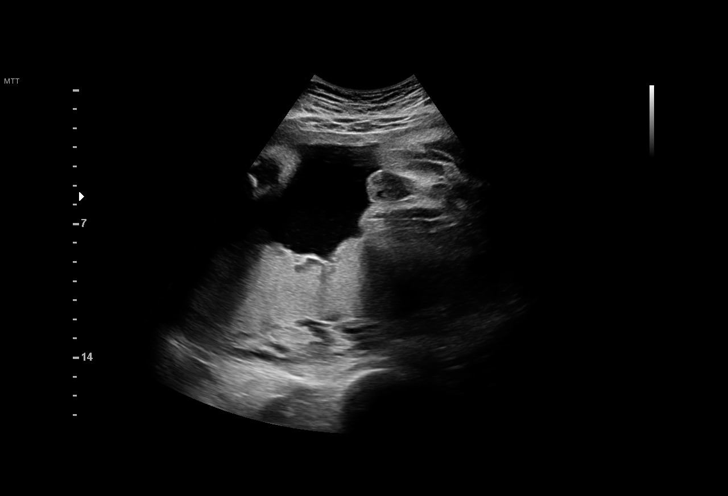
[im 21/37]
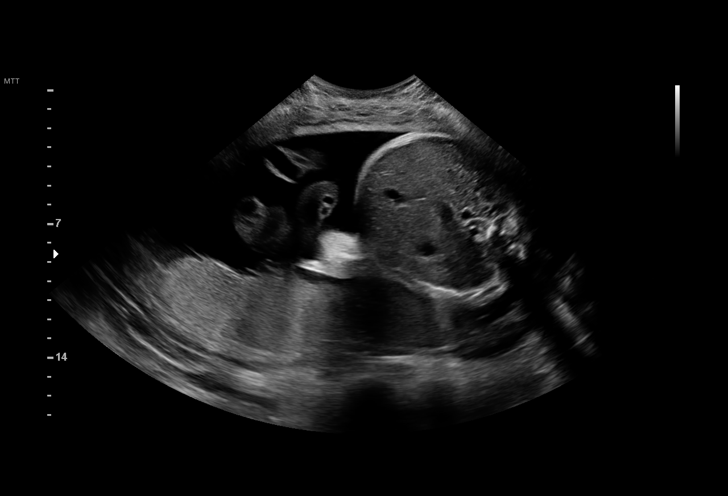
[im 23/37]
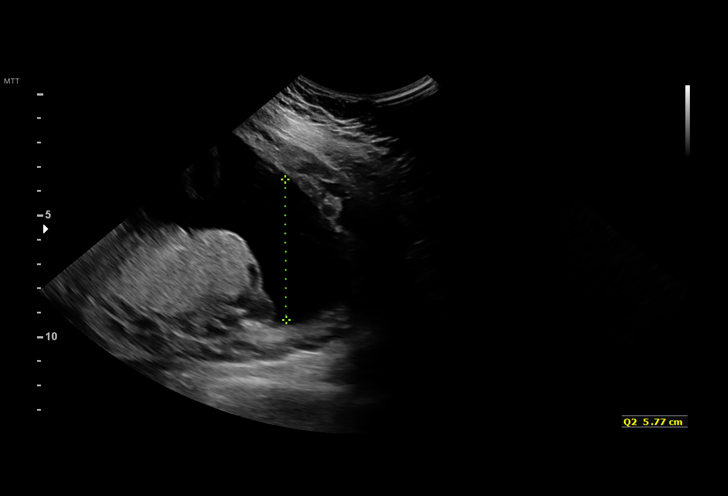
[im 26/37]
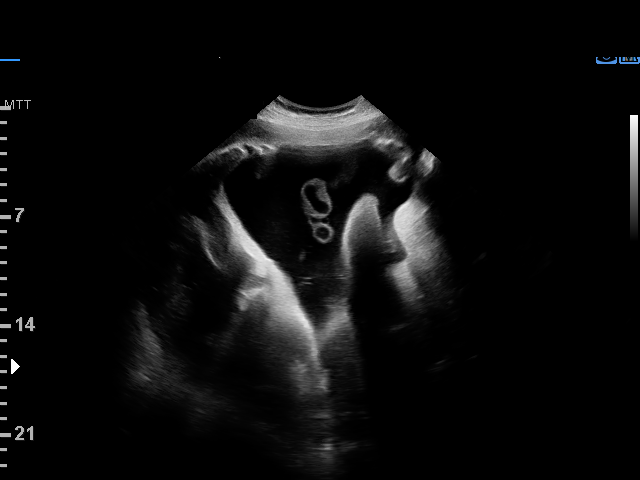
[im 29/37]
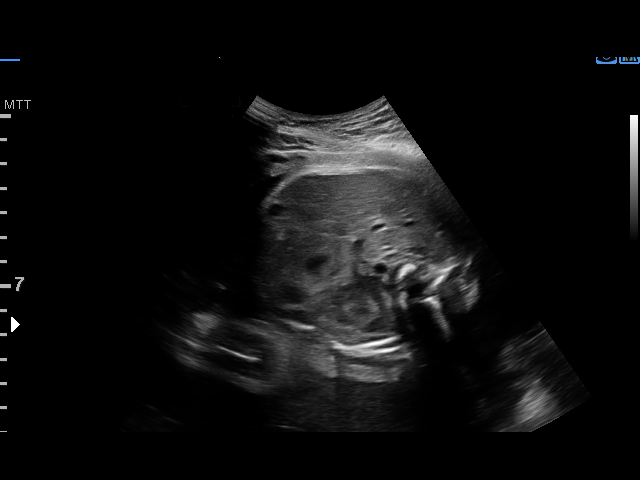
[im 31/37]
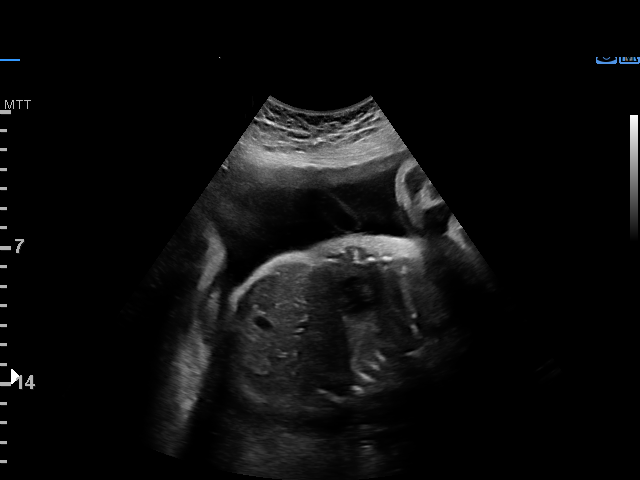
[im 34/37]
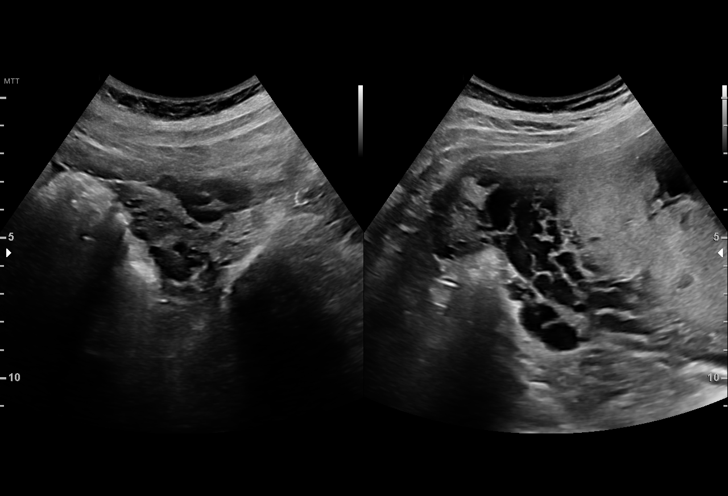
[im 37/37]
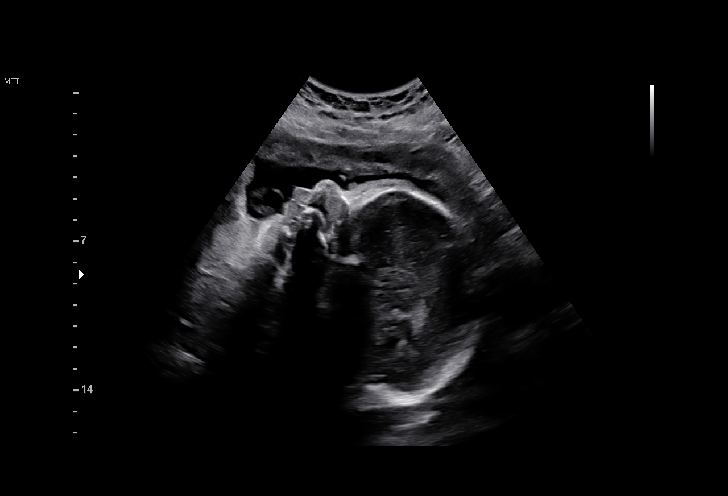

[15 of 28 positions shown; findings below may reference images not displayed]

1  NANDHO LIMAGE           [id number]      [id number]     [id number]
Indications

30 weeks gestation of pregnancy
Poor obstetric history: Previous
preeclampsia / eclampsia/gestational HTN-
Aspirin, Labetalol
Aujla isoimmunization during pregnancy in
third trimester, single or unspecified fetus:
FOB Aujla antigen negative
OB History

Gravidity:    5         Term:   2        Prem:   1        SAB:   1
TOP:          0       Ectopic:  0        Living: 3
Fetal Evaluation

Num Of Fetuses:     1
Fetal Heart         134
Rate(bpm):
Cardiac Activity:   Observed
Presentation:       Cephalic
Placenta:           Posterior, above cervical os
P. Cord Insertion:  Previously Visualized

Amniotic Fluid
AFI FV:      Subjectively within normal limits

AFI Sum(cm)     %Tile       Largest Pocket(cm)
16.7            61
RUQ(cm)       RLQ(cm)       LUQ(cm)        LLQ(cm)
5.29
Biophysical Evaluation

Amniotic F.V:   Within normal limits       F. Tone:        Observed
F. Movement:    Observed                   Score:          [DATE]
F. Breathing:   Observed
Gestational Age

LMP:           30w 3d       Date:   11/12/15                 EDD:   08/18/16
Best:          30w 3d    Det. By:   LMP  (11/12/15)          EDD:   08/18/16
Cervix Uterus Adnexa

Cervix
Not visualized (advanced GA >78wks)

Uterus
No abnormality visualized.

Left Ovary
Size(cm)     3.93  x    2.02   x  1.49      Vol(ml):
Within normal limits. No adnexal mass visualized.

Right Ovary
Size(cm)       3.7 x    2.01   x  1.9       Vol(ml):
Within normal limits. No adnexal mass visualized.

Cul De Sac:   No free fluid seen.

Adnexa:       No abnormality visualized.
Impression

Single IUP at 30w 3Susell
Aujla alloimmunization - father of the baby negative;
gestational hypertension
Cephalic presentataion
BPP [DATE]
Normal amniotic fluid volume
Recommendations

Continue antenatal testing as scheduled
Ultrasound for growth at next visit in 2 weeks

## 2018-04-30 ENCOUNTER — Ambulatory Visit: Payer: Medicaid Other

## 2018-05-01 ENCOUNTER — Ambulatory Visit: Payer: Medicaid Other

## 2018-05-01 ENCOUNTER — Other Ambulatory Visit (HOSPITAL_COMMUNITY)
Admission: RE | Admit: 2018-05-01 | Discharge: 2018-05-01 | Disposition: A | Discharge status: Home / Self Care | Payer: Medicaid Other | Source: Ambulatory Visit | Attending: Obstetrics & Gynecology | Admitting: Obstetrics & Gynecology

## 2018-05-01 DIAGNOSIS — N898 Other specified noninflammatory disorders of vagina: Secondary | ICD-10-CM

## 2018-05-01 DIAGNOSIS — R300 Dysuria: Secondary | ICD-10-CM | POA: Diagnosis not present

## 2018-05-01 LAB — POCT URINALYSIS DIPSTICK
BILIRUBIN UA: NEGATIVE
GLUCOSE UA: NEGATIVE
Ketones, UA: NEGATIVE
LEUKOCYTES UA: NEGATIVE
Nitrite, UA: NEGATIVE
PH UA: 5 (ref 5.0–8.0)
Protein, UA: NEGATIVE
SPEC GRAV UA: 1.01 (ref 1.010–1.025)
UROBILINOGEN UA: 0.2 U/dL

## 2018-05-01 NOTE — Progress Notes (Signed)
Pt c/o dysuria, frequent urination and vaginal irritation for the past three days. Pt collected self swab and urine sample. Pt request STI testing on the self swab. UA Dipstick looks normal with trace of blood. Pt is aware we will call her with results and treat if necessary.

## 2018-05-03 LAB — URINE CULTURE

## 2018-05-05 LAB — CERVICOVAGINAL ANCILLARY ONLY
BACTERIAL VAGINITIS: NEGATIVE
CANDIDA VAGINITIS: NEGATIVE
CHLAMYDIA, DNA PROBE: NEGATIVE
Neisseria Gonorrhea: NEGATIVE
Trichomonas: NEGATIVE

## 2018-05-24 IMAGING — CR DG CHEST 2V
2 series · 2 of 2 positions shown · non-contrast
Comparison: 04/09/2008

CLINICAL DATA: Shortness of breath. Headache and blurred vision.
History of pre-eclampsia. Third-trimester pregnancy. Hypertensive
today.

EXAM:
CHEST  2 VIEW

[chest pa]
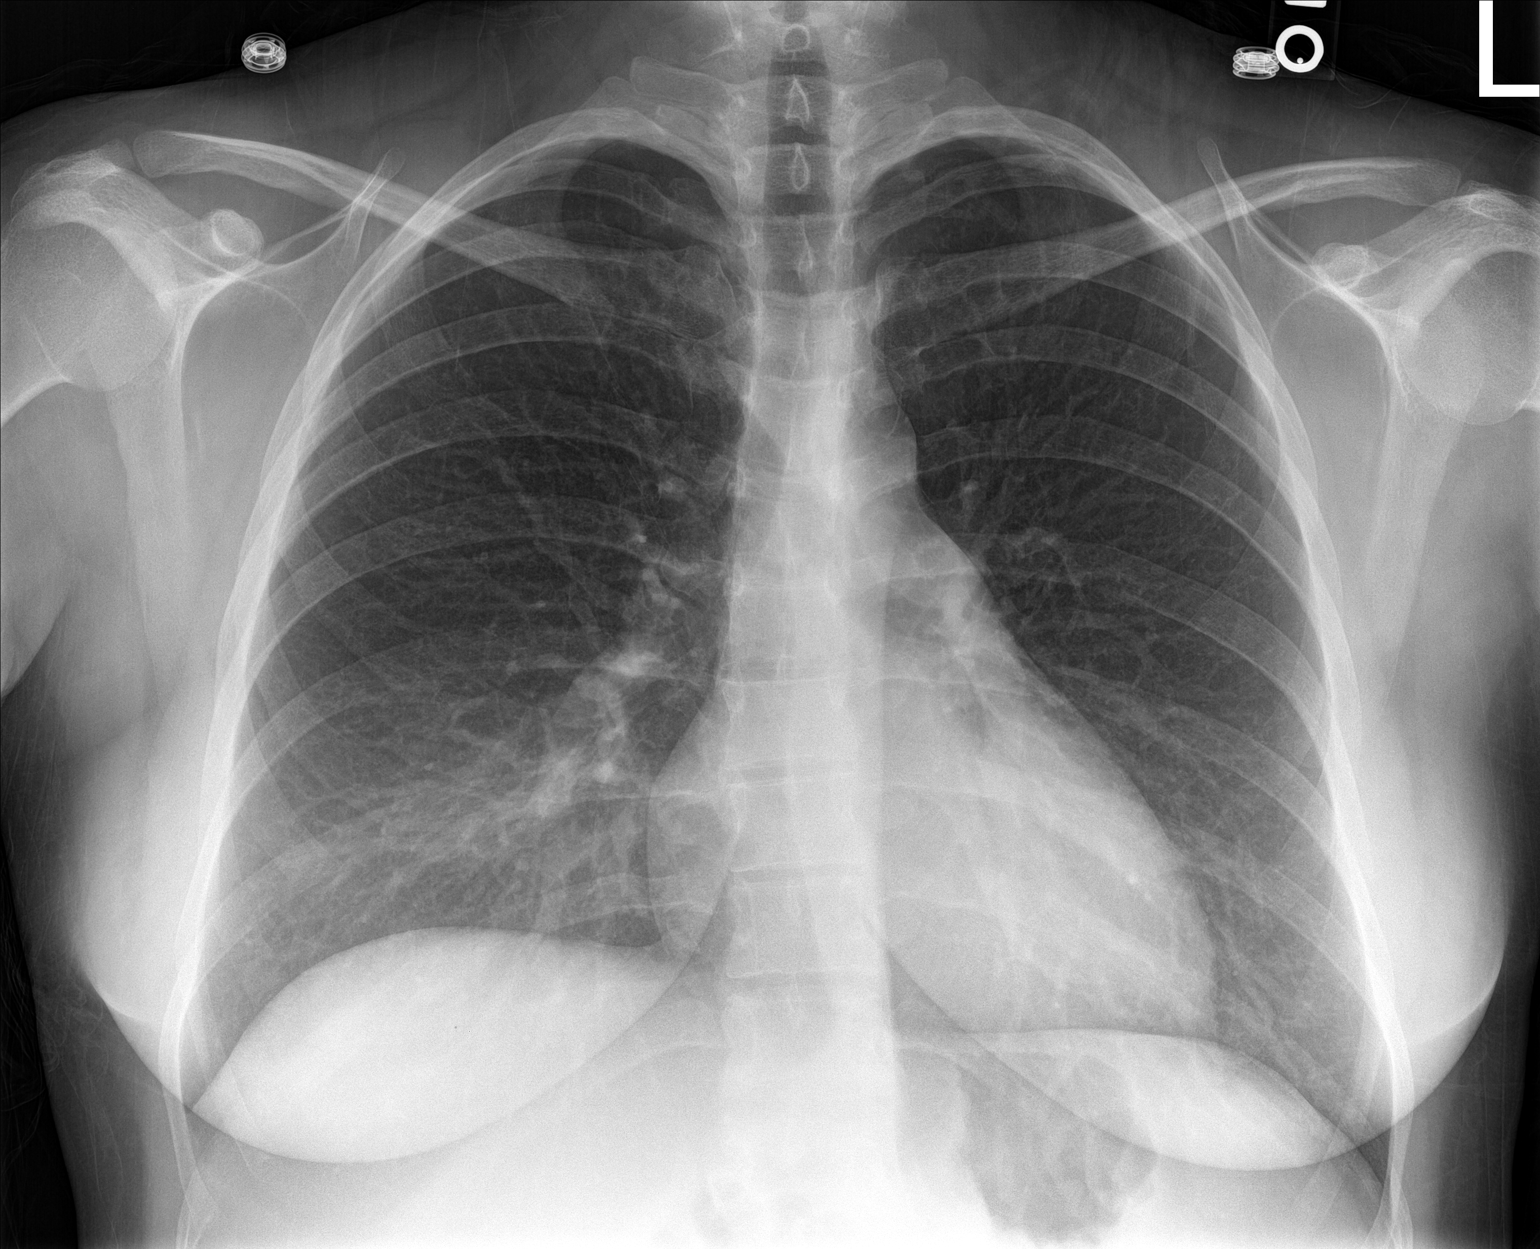

[chest lat]
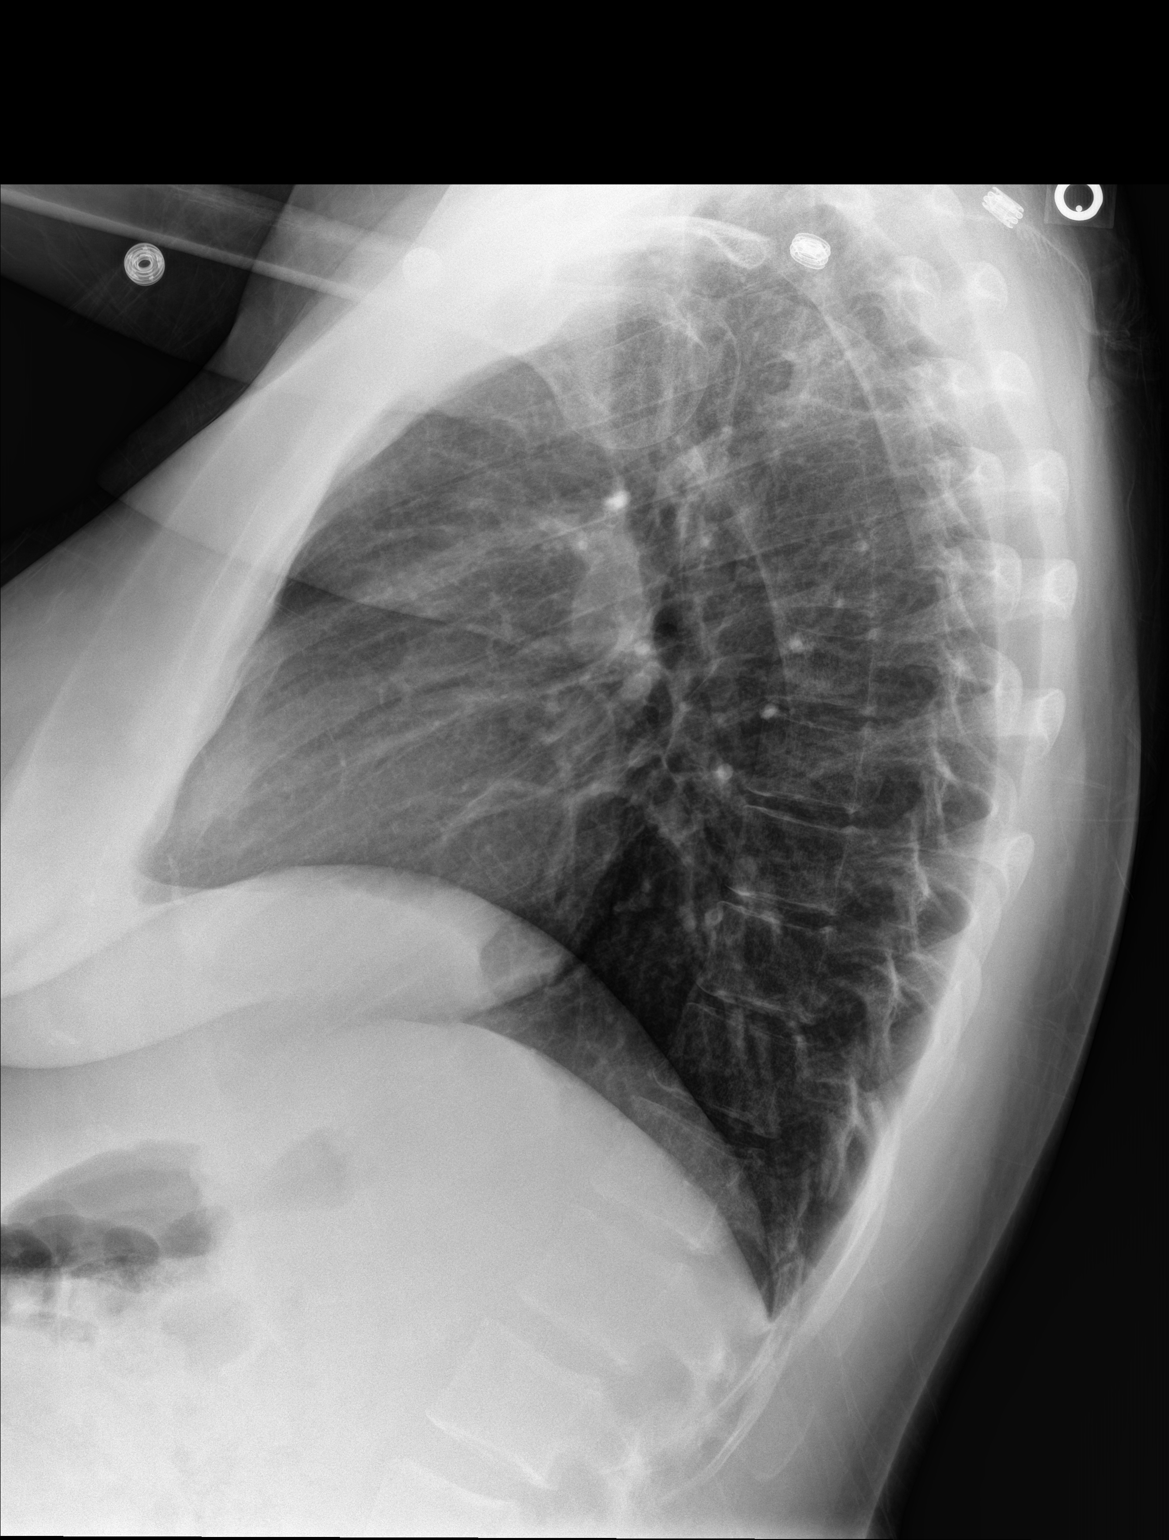

[2 of 2 positions shown; findings below may reference images not displayed]

FINDINGS: The heart size and mediastinal contours are within normal limits.
Both lungs are clear. The visualized skeletal structures are
unremarkable.
IMPRESSION: No active cardiopulmonary disease.

## 2018-07-24 ENCOUNTER — Telehealth: Payer: Self-pay | Admitting: *Deleted

## 2018-07-24 NOTE — Telephone Encounter (Signed)
Pt called stating that she had a SAB in Feb.  She went to her PCP and was given "something to have me pass the tissue"  She states that she has continued to bleed and was told to do a UPT.  Per the patient it was faintly positive.  Now she doesn't know if she has had retained POC or whether this is a new pregnancy.  She will come in for lab only BHCG x 2 to determine where this pregnancy is and may scheduled an U/S accordingly.

## 2018-07-28 ENCOUNTER — Other Ambulatory Visit: Payer: Self-pay

## 2018-07-28 ENCOUNTER — Other Ambulatory Visit (INDEPENDENT_AMBULATORY_CARE_PROVIDER_SITE_OTHER): Payer: Medicaid Other

## 2018-07-28 DIAGNOSIS — O039 Complete or unspecified spontaneous abortion without complication: Secondary | ICD-10-CM

## 2018-07-28 NOTE — Progress Notes (Signed)
Pt here for lab only 

## 2018-07-29 LAB — HCG, QUANTITATIVE, PREGNANCY: HCG, Total, QN: 28 m[IU]/mL

## 2018-07-30 ENCOUNTER — Other Ambulatory Visit: Payer: Self-pay

## 2018-07-30 ENCOUNTER — Other Ambulatory Visit (INDEPENDENT_AMBULATORY_CARE_PROVIDER_SITE_OTHER): Payer: Medicaid Other

## 2018-07-30 DIAGNOSIS — O039 Complete or unspecified spontaneous abortion without complication: Secondary | ICD-10-CM

## 2018-07-30 NOTE — Progress Notes (Signed)
Pt here for lab work only

## 2018-07-31 ENCOUNTER — Telehealth: Payer: Self-pay | Admitting: *Deleted

## 2018-07-31 LAB — HCG, QUANTITATIVE, PREGNANCY: HCG, Total, QN: 5 m[IU]/mL

## 2018-07-31 NOTE — Telephone Encounter (Signed)
Pt came for a repeat BHCG after she had a failed pregnancy that landed her in the hospital @ [facility].  She was seen at [facility] for heavy vaginal bleeding and per pt a bedside D and C was performed.  She had been given Methergine there also.  Her HCG on 4/27 was 28 repeat 4/29 was 5.  Results faxed to [facility] for review.

## 2019-04-06 ENCOUNTER — Other Ambulatory Visit (HOSPITAL_COMMUNITY)
Admission: RE | Admit: 2019-04-06 | Discharge: 2019-04-06 | Disposition: A | Discharge status: Home / Self Care | Payer: Medicaid Other | Source: Ambulatory Visit | Attending: Obstetrics & Gynecology | Admitting: Obstetrics & Gynecology

## 2019-04-06 ENCOUNTER — Other Ambulatory Visit: Payer: Self-pay

## 2019-04-06 ENCOUNTER — Ambulatory Visit: Payer: Medicaid Other | Admitting: Obstetrics & Gynecology

## 2019-04-06 ENCOUNTER — Encounter: Payer: Self-pay | Admitting: Obstetrics & Gynecology

## 2019-04-06 ENCOUNTER — Other Ambulatory Visit: Payer: Self-pay | Admitting: Obstetrics & Gynecology

## 2019-04-06 VITALS — BP 134/95 | HR 65 | Temp 98.7°F | Resp 16 | Ht 66.0 in | Wt 172.0 lb

## 2019-04-06 DIAGNOSIS — Z3202 Encounter for pregnancy test, result negative: Secondary | ICD-10-CM

## 2019-04-06 DIAGNOSIS — R87619 Unspecified abnormal cytological findings in specimens from cervix uteri: Secondary | ICD-10-CM | POA: Insufficient documentation

## 2019-04-06 LAB — POCT URINE PREGNANCY: Preg Test, Ur: NEGATIVE

## 2019-04-06 NOTE — Progress Notes (Signed)
   Subjective:    Patient ID: Rebekah Smith, female    DOB: Dec 27, 1985, 34 y.o.   MRN: [id number]  HPI  Pt presents for abnormal pap smear at outside [place] office.  Pt had her childrenwith [place] and wants [place] to do colposcopy.  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    Review of Systems  Constitutional: Negative.   Respiratory: Negative.   Cardiovascular: Negative.   Gastrointestinal: Negative.   Genitourinary: Negative.        Objective:   Physical Exam Vitals reviewed.  Constitutional:      General: She is not in acute distress.    Appearance: She is well-developed.  HENT:     Head: Normocephalic and atraumatic.  Eyes:     Conjunctiva/sclera: Conjunctivae normal.  Cardiovascular:     Rate and Rhythm: Normal rate.  Pulmonary:     Effort: Pulmonary effort is normal.  Skin:    General: Skin is warm and dry.  Neurological:     Mental Status: She is alert and oriented to person, place, and time.    Vitals:   04/06/19 1456  BP: (!) 134/95  Pulse: 65  Resp: 16  Temp: 98.7 F (37.1 C)  Weight: 172 lb (78 kg)  Height: 5\' 6"  (1.676 m)       Assessment & Plan:  Ascus with HPV Proceed with colposcopy See procedure note.

## 2019-04-06 NOTE — Progress Notes (Signed)
Colposcopy Procedure Note  Indications: Pap smear 1 months ago showed: ASCUS with POSITIVE high risk HPV. The prior pap showed no abnormalities.  Prior cervical/vaginal disease: HPV related changes. Prior cervical treatment: no treatment.  Procedure Details  The risks and benefits of the procedure and Written informed consent obtained.  Speculum placed in vagina and excellent visualization of cervix achieved, cervix swabbed x 3 with acetic acid solution.  Findings: Cervix: acetowhite lesion(s) noted at 11 and 3 o'clock; cervix swabbed with Lugol's solution, SCJ visualized - lesion at 11 & 3 o'clock, cervical biopsies taken at 11 & 3 o'clock, specimen labelled and sent to pathology and hemostasis achieved with Monsel's solution. Vaginal inspection: vaginal colposcopy not performed. Vulvar colposcopy: vulvar colposcopy not performed.  Specimens: ECC and cervical bx at 11 and 3 o'clock   Complications: none.  Plan: Specimens labelled and sent to Pathology. Will base further treatment on Pathology findings. Post biopsy instructions given to patient.

## 2019-04-08 LAB — URINE CULTURE
MICRO NUMBER:: 10004302
SPECIMEN QUALITY:: ADEQUATE

## 2019-04-08 LAB — CERVICOVAGINAL ANCILLARY ONLY
Bacterial Vaginitis (gardnerella): NEGATIVE
Comment: NEGATIVE

## 2019-04-13 ENCOUNTER — Encounter: Payer: Self-pay | Admitting: Obstetrics & Gynecology

## 2019-04-13 DIAGNOSIS — R87610 Atypical squamous cells of undetermined significance on cytologic smear of cervix (ASC-US): Secondary | ICD-10-CM | POA: Insufficient documentation

## 2020-03-14 ENCOUNTER — Ambulatory Visit (INDEPENDENT_AMBULATORY_CARE_PROVIDER_SITE_OTHER): Payer: Medicaid Other | Admitting: Family Medicine

## 2020-03-14 ENCOUNTER — Other Ambulatory Visit: Payer: Self-pay

## 2020-03-14 ENCOUNTER — Encounter: Payer: Self-pay | Admitting: Family Medicine

## 2020-03-14 DIAGNOSIS — R002 Palpitations: Secondary | ICD-10-CM | POA: Diagnosis not present

## 2020-03-14 DIAGNOSIS — R079 Chest pain, unspecified: Secondary | ICD-10-CM

## 2020-03-14 DIAGNOSIS — K279 Peptic ulcer, site unspecified, unspecified as acute or chronic, without hemorrhage or perforation: Secondary | ICD-10-CM | POA: Diagnosis not present

## 2020-03-14 DIAGNOSIS — M940 Chondrocostal junction syndrome [Tietze]: Secondary | ICD-10-CM | POA: Insufficient documentation

## 2020-03-14 NOTE — Progress Notes (Signed)
    SUBJECTIVE:   CHIEF COMPLAINT / HPI:   34 yo female new patient (daughter of Garner Gavel) with chest pain and palpitations since receiving COVID vaccination at the end of Sept 2021.  She has already had a cardiology [facility] Which I accessed through Care Everywhere.  Tests included: 1. A 30 day event monitor which showed since tachy and brady only. 2. A stress echo which showed only minor tricuspid regurg. Her concern is there have not been any blood tests.   She continues to have intermitant chest pain, left ant upper chest and palpitations.  No syncope or near syncope.  She has been recently started on Toprol XL 25 mg daily.  She is not yet able to say if it has had any improvement.  She does have a history of palpitations.  To our knowledge, no underlying cardiac pathology has been identified.  She is also worried about the possibility of vaccine induced pericarditis.  Did not have diffuse ST changes on EKG and did not have any pericardial effusion. She has normal life stresses.  She doesn't admit to any worsening anxiety or recent life trauma.  She has peptic ulcer disease, but not GERD.  Takes omeprazole prn.  PERTINENT  PMH / PSH: Of note, she works at [employer].  She is here at her mother's urging to get a second opinion.  OBJECTIVE:   BP 108/80   Pulse 73   Ht 5\' 6"  (1.676 m)   Wt 168 lb 3.2 oz (76.3 kg)   LMP 01/24/2020 (Approximate)   SpO2 95%   BMI 27.15 kg/m   Neck, no thyromegally Lungs clear Chest, tender to palpation on left 3rd rib insertion into sternum Cardiac RRR without m or g   ASSESSMENT/PLAN:   No problem-specific Assessment & Plan notes found for this encounter.     01/26/2020, MD [facility]

## 2020-03-14 NOTE — Assessment & Plan Note (Signed)
Check TSH 

## 2020-03-14 NOTE — Assessment & Plan Note (Signed)
Doubt sig pathology.  Specifically, no evidence of pericarditis.  Will check labs including trop.  If these are normal, reassurance.  Likely will follow up with her [facility] providers.

## 2020-03-14 NOTE — Assessment & Plan Note (Signed)
Recommend topical lidocaine or voltarin.

## 2020-03-14 NOTE — Patient Instructions (Signed)
You have had a good workup so far.  I think the only thing left is the blood work.  I have ordered a bunch and will call you with the test results. I think if the tests are normal, we can focus on making you feel better. You do have costochondritis.  A weird inflammation of the rib joint attaching to the breast bone.  I would recommend that you try either lidocaine (numbing) cream or patch; or voltaren gel.  Whichever works best for you. Other things you can.   1. Try taking your omeprazole daily because of the weird association between reflux and palpitations.   2. The metoprolol is a good choice.  Often it treats palpitations (and high blood pressure.)  You are on a very low dose.  We can try increasing.  I can follow up with you as needed.

## 2020-03-14 NOTE — Assessment & Plan Note (Signed)
Informed of association between GERD and palpitations.  Consider trial of continuous omeprazole.

## 2020-03-15 LAB — CBC
Hematocrit: 38.8 % (ref 34.0–46.6)
Hemoglobin: 13.3 g/dL (ref 11.1–15.9)
MCH: 28.6 pg (ref 26.6–33.0)
MCHC: 34.3 g/dL (ref 31.5–35.7)
MCV: 83 fL (ref 79–97)
Platelets: 234 10*3/uL (ref 150–450)
RBC: 4.65 x10E6/uL (ref 3.77–5.28)
RDW: 12.2 % (ref 11.7–15.4)
WBC: 5.7 10*3/uL (ref 3.4–10.8)

## 2020-03-15 LAB — CMP14+EGFR
ALT: 9 IU/L (ref 0–32)
AST: 17 IU/L (ref 0–40)
Albumin/Globulin Ratio: 1.7 (ref 1.2–2.2)
Albumin: 4.4 g/dL (ref 3.8–4.8)
Alkaline Phosphatase: 54 IU/L (ref 44–121)
BUN/Creatinine Ratio: 18 (ref 9–23)
BUN: 14 mg/dL (ref 6–20)
Bilirubin Total: 0.3 mg/dL (ref 0.0–1.2)
CO2: 22 mmol/L (ref 20–29)
Calcium: 9.2 mg/dL (ref 8.7–10.2)
Chloride: 102 mmol/L (ref 96–106)
Creatinine, Ser: 0.78 mg/dL (ref 0.57–1.00)
GFR calc Af Amer: 116 mL/min/{1.73_m2} (ref >59)
GFR calc non Af Amer: 101 mL/min/{1.73_m2} (ref >59)
Globulin, Total: 2.6 g/dL (ref 1.5–4.5)
Glucose: 85 mg/dL (ref 65–99)
Potassium: 4.4 mmol/L (ref 3.5–5.2)
Sodium: 137 mmol/L (ref 134–144)
Total Protein: 7 g/dL (ref 6.0–8.5)

## 2020-03-15 LAB — LIPID PANEL
Chol/HDL Ratio: 3.1 ratio (ref 0.0–4.4)
Cholesterol, Total: 159 mg/dL (ref 100–199)
HDL: 51 mg/dL (ref >39)
LDL Chol Calc (NIH): 94 mg/dL (ref 0–99)
Triglycerides: 74 mg/dL (ref 0–149)
VLDL Cholesterol Cal: 14 mg/dL (ref 5–40)

## 2020-03-15 LAB — TSH: TSH: 1.42 u[IU]/mL (ref 0.450–4.500)

## 2020-03-15 LAB — TROPONIN T: Troponin T (Highly Sensitive): <6 ng/L (ref 0–14)

## 2020-06-16 ENCOUNTER — Encounter: Payer: Self-pay | Admitting: Obstetrics & Gynecology

## 2020-06-16 ENCOUNTER — Other Ambulatory Visit (HOSPITAL_COMMUNITY)
Admission: RE | Admit: 2020-06-16 | Discharge: 2020-06-16 | Disposition: A | Discharge status: Home / Self Care | Payer: Medicaid Other | Source: Ambulatory Visit | Attending: Obstetrics & Gynecology | Admitting: Obstetrics & Gynecology

## 2020-06-16 ENCOUNTER — Ambulatory Visit: Payer: Medicaid Other | Admitting: Obstetrics & Gynecology

## 2020-06-16 ENCOUNTER — Other Ambulatory Visit: Payer: Self-pay

## 2020-06-16 VITALS — BP 120/76 | HR 61 | Resp 16 | Ht 66.0 in | Wt 172.0 lb

## 2020-06-16 DIAGNOSIS — R87610 Atypical squamous cells of undetermined significance on cytologic smear of cervix (ASC-US): Secondary | ICD-10-CM | POA: Insufficient documentation

## 2020-06-16 DIAGNOSIS — R87810 Cervical high risk human papillomavirus (HPV) DNA test positive: Secondary | ICD-10-CM | POA: Diagnosis not present

## 2020-06-16 DIAGNOSIS — Z01419 Encounter for gynecological examination (general) (routine) without abnormal findings: Secondary | ICD-10-CM | POA: Insufficient documentation

## 2020-06-16 NOTE — Addendum Note (Signed)
Addended by: Kathie Dike on: 06/16/2020 02:26 PM   Modules accepted: Orders

## 2020-06-16 NOTE — Progress Notes (Signed)
Last pap- 08/21/16- negative

## 2020-06-16 NOTE — Progress Notes (Signed)
Subjective:     Rebekah Smith is a 35 y.o. female here for a routine exam.  Current complaints: interested in genetic testing.  PGM (early 89s), Paternal great aunts also have breast cancer and MGM (70s) both with breast cancer    Gynecologic History Patient's last menstrual period was 06/08/2020. Contraception: abstinence Last Pap: 2021. Results were: ASCUS, +HPV  Obstetric History OB History  Gravida Para Term Preterm AB Living  5 4 2 2 1 4   SAB IAB Ectopic Multiple Live Births  1     0 4    # Outcome Date GA Lbr Len/2nd Weight Sex Delivery Anes PTL Lv  5 Preterm 07/14/16 [redacted]w[redacted]d 21:04 / 00:15 7 lb 4.4 oz (3.3 kg) M Vag-Spont EPI  LIV     Birth Comments: wnl  4 Preterm 04/30/14 [redacted]w[redacted]d / 00:14 6 lb 2.1 oz (2.781 kg) M Vag-Spont EPI  LIV     Birth Comments: within normal limits  3 SAB 06/09/13          2 Term 09/01/10 [redacted]w[redacted]d  9 lb 9 oz (4.338 kg) M Vag-Spont EPI N LIV     Birth Comments: PPH  1 Term 12/03/07 [redacted]w[redacted]d  8 lb 9 oz (3.884 kg) M Vag-Spont EPI N LIV     The following portions of the patient's history were reviewed and updated as appropriate: allergies, current medications, past family history, past medical history, past social history, past surgical history and problem list.  Review of Systems Pertinent items noted in HPI and remainder of comprehensive ROS otherwise negative.    Objective:      Vitals:   06/16/20 1346  BP: 120/76  Pulse: 61  Resp: 16  Weight: 172 lb (78 kg)  Height: 5\' 6"  (1.676 m)   Vitals:  WNL General appearance: alert, cooperative and no distress  HEENT: Normocephalic, without obvious abnormality, atraumatic Eyes: negative Throat: lips, mucosa, and tongue normal; teeth and gums normal  Respiratory: Clear to auscultation bilaterally  CV: Regular rate and rhythm  Breasts:  Normal appearance, no masses or tenderness, no nipple retraction or dimpling  GI: Soft, non-tender; bowel sounds normal; no masses,  no organomegaly  GU: External  Genitalia:  Tanner V, no lesion Urethra:  No prolapse   Vagina: Pink, normal rugae, no blood or discharge  Cervix: No CMT, no lesion  Uterus:  Normal size and contour, non tender  Adnexa: Normal, no masses, non tender  Musculoskeletal: No edema, redness or tenderness in the calves or thighs  Skin: No lesions or rash  Lymphatic: Axillary adenopathy: none     Psychiatric: Normal mood and behavior   Assessment:    Healthy female exam.    Plan:   1.  Pap with coteseting. 2.  Genetic testing for fam hx of breasat cancer 3.  Pt content with abstinence (separted from partner)

## 2020-06-20 LAB — CYTOLOGY - PAP
Comment: NEGATIVE
Diagnosis: NEGATIVE
Diagnosis: REACTIVE
High risk HPV: POSITIVE — AB

## 2020-06-24 ENCOUNTER — Encounter: Payer: Self-pay | Admitting: Obstetrics & Gynecology

## 2020-06-24 ENCOUNTER — Telehealth: Payer: Self-pay | Admitting: *Deleted

## 2020-06-24 NOTE — Telephone Encounter (Signed)
Not able to leave patient a message due to voicemail being full to call and schedule colpo.

## 2020-06-24 NOTE — Telephone Encounter (Signed)
-----   Message from Lesly Dukes, MD sent at 06/24/2020  9:58 AM EDT ----- Please call to schedule colposcopy

## 2020-06-29 ENCOUNTER — Telehealth: Payer: Self-pay | Admitting: *Deleted

## 2020-06-29 NOTE — Telephone Encounter (Signed)
Not able to leave patient a message to call and schedule repeat Colpo per Dr. Penne Lash.

## 2020-06-29 NOTE — Telephone Encounter (Signed)
-----   Message from Kelly H Leggett, MD sent at 06/24/2020  9:58 AM EDT ----- Please call to schedule colposcopy  

## 2020-06-30 ENCOUNTER — Encounter: Payer: Self-pay | Admitting: Obstetrics & Gynecology

## 2020-06-30 DIAGNOSIS — Z803 Family history of malignant neoplasm of breast: Secondary | ICD-10-CM | POA: Insufficient documentation

## 2020-07-25 ENCOUNTER — Other Ambulatory Visit: Payer: Self-pay

## 2020-07-25 ENCOUNTER — Encounter: Payer: Self-pay | Admitting: Obstetrics & Gynecology

## 2020-07-25 ENCOUNTER — Ambulatory Visit (INDEPENDENT_AMBULATORY_CARE_PROVIDER_SITE_OTHER): Payer: Medicaid Other | Admitting: Obstetrics & Gynecology

## 2020-07-25 ENCOUNTER — Other Ambulatory Visit (HOSPITAL_COMMUNITY)
Admission: RE | Admit: 2020-07-25 | Discharge: 2020-07-25 | Disposition: A | Discharge status: Home / Self Care | Payer: Medicaid Other | Source: Ambulatory Visit | Attending: Obstetrics & Gynecology | Admitting: Obstetrics & Gynecology

## 2020-07-25 VITALS — BP 116/76 | HR 72 | Resp 16 | Ht 66.0 in | Wt 169.0 lb

## 2020-07-25 DIAGNOSIS — Z3202 Encounter for pregnancy test, result negative: Secondary | ICD-10-CM

## 2020-07-25 DIAGNOSIS — R87619 Unspecified abnormal cytological findings in specimens from cervix uteri: Secondary | ICD-10-CM | POA: Diagnosis not present

## 2020-07-25 DIAGNOSIS — B977 Papillomavirus as the cause of diseases classified elsewhere: Secondary | ICD-10-CM

## 2020-07-25 LAB — POCT URINE PREGNANCY: Preg Test, Ur: NEGATIVE

## 2020-07-25 NOTE — Progress Notes (Signed)
Colposcopy Procedure Note  Indications: Pap smear 1 months ago showed: +HPV. The prior pap showed ASCUS with POSITIVE high risk HPV.  Prior cervical/vaginal disease: inflammation on biopsy (2021). Prior cervical treatment: no treatment.  Procedure Details  The risks and benefits of the procedure and Written informed consent obtained.  Speculum placed in vagina and excellent visualization of cervix achieved, cervix swabbed x 3 with acetic acid solution.  Findings: Cervix: mild AWE at 11 and 6 o'clock; cervix swabbed with Lugol's solution, cervical biopsies taken at 11 & 6 o'clock, specimen labelled and sent to pathology and hemostasis achieved with Monsel's solution. ADEQUATE COLPPOSCOPY Vaginal inspection: vaginal colposcopy not performed. Vulvar colposcopy: vulvar colposcopy not performed.  Specimens: ECC, cervical biopsies 11 & 6  Complications: none.  Plan: Specimens labelled and sent to Pathology. Will base further treatment on Pathology findings. Post biopsy instructions given to patient.

## 2020-07-28 ENCOUNTER — Encounter: Payer: Medicaid Other | Admitting: Obstetrics & Gynecology

## 2020-07-28 LAB — SURGICAL PATHOLOGY

## 2020-08-02 ENCOUNTER — Telehealth: Payer: Self-pay | Admitting: *Deleted

## 2020-08-02 NOTE — Telephone Encounter (Signed)
Transition Care Management Follow-up Telephone Call  Date of discharge and from where: 08/01/2020 - [facility]  How have you been since you were released from the hospital? "A little better"  Any questions or concerns? No  Items Reviewed:  Did the pt receive and understand the discharge instructions provided? Yes   Medications obtained and verified? Yes   Other? No   Any new allergies since your discharge? No   Dietary orders reviewed? No  Do you have support at home? Yes   Functional Questionnaire: (I = Independent and D = Dependent) ADLs: I  Bathing/Dressing- I  Meal Prep- I  Eating- I  Maintaining continence- I  Transferring/Ambulation- I  Managing Meds- I  Follow up appointments reviewed:   PCP Hospital f/u appt confirmed? No    Specialist Hospital f/u appt confirmed? No    Are transportation arrangements needed? No   If their condition worsens, is the pt aware to call PCP or go to the Emergency Dept.? Yes  Was the patient provided with contact information for the PCP's office or ED? Yes  Was to pt encouraged to call back with questions or concerns? Yes

## 2020-12-23 ENCOUNTER — Other Ambulatory Visit: Payer: Self-pay

## 2020-12-23 ENCOUNTER — Ambulatory Visit: Payer: Medicaid Other | Admitting: Obstetrics and Gynecology

## 2020-12-23 ENCOUNTER — Other Ambulatory Visit (HOSPITAL_COMMUNITY)
Admission: RE | Admit: 2020-12-23 | Discharge: 2020-12-23 | Disposition: A | Discharge status: Home / Self Care | Payer: Medicaid Other | Source: Ambulatory Visit | Attending: Obstetrics and Gynecology | Admitting: Obstetrics and Gynecology

## 2020-12-23 ENCOUNTER — Encounter: Payer: Self-pay | Admitting: Obstetrics and Gynecology

## 2020-12-23 VITALS — BP 119/73 | HR 69 | Resp 16 | Ht 66.0 in | Wt 167.0 lb

## 2020-12-23 DIAGNOSIS — N898 Other specified noninflammatory disorders of vagina: Secondary | ICD-10-CM | POA: Diagnosis not present

## 2020-12-23 DIAGNOSIS — Z113 Encounter for screening for infections with a predominantly sexual mode of transmission: Secondary | ICD-10-CM | POA: Diagnosis not present

## 2020-12-23 MED ORDER — CLOTRIMAZOLE-BETAMETHASONE 1-0.05 % EX CREA: 1.0000 "application " | TOPICAL_CREAM | Freq: Two times a day (BID) | CUTANEOUS | 0 refills | Status: DC

## 2020-12-23 NOTE — Progress Notes (Signed)
GYNECOLOGY ENCOUNTER NOTE  History:     Rebekah Smith is a 35 y.o. [phone] female here for STD testing. She had unprotected intercourse with her partner a few weeks ago and then a few days ago. Would like to have STD testing. She reports some labial irritation/ redness. No bumps or lesions noted. Some white discharge, however no odor. No abdominal pain or fever.   Gynecologic History Patient's last menstrual period was 12/05/2020.  Obstetric History OB History  Gravida Para Term Preterm AB Living  5 4 2 2 1 4   SAB IAB Ectopic Multiple Live Births  1     0 4    # Outcome Date GA Lbr Len/2nd Weight Sex Delivery Anes PTL Lv  5 Preterm 07/14/16 [redacted]w[redacted]d 21:04 / 00:15 7 lb 4.4 oz (3.3 kg) M Vag-Spont EPI  LIV     Birth Comments: wnl  4 Preterm 04/30/14 [redacted]w[redacted]d / 00:14 6 lb 2.1 oz (2.781 kg) M Vag-Spont EPI  LIV     Birth Comments: within normal limits  3 SAB 06/09/13          2 Term 09/01/10 [redacted]w[redacted]d  9 lb 9 oz (4.338 kg) M Vag-Spont EPI N LIV     Birth Comments: PPH  1 Term 12/03/07 [redacted]w[redacted]d  8 lb 9 oz (3.884 kg) M Vag-Spont EPI N LIV    Past Medical History:  Diagnosis Date   Abnormal Pap smear of cervix    colposcopy   Anemia    Blood transfusion    Hemorrhage    History of preeclampsia    Had in two last pregnancies, denies chronic hypertension as she is normotensive when not pregnant   Migraines    Pregnancy induced hypertension     Past Surgical History:  Procedure Laterality Date   TONSILLECTOMY     TONSILLECTOMY      Current Outpatient Medications on File Prior to Visit  Medication Sig Dispense Refill   acetaminophen (TYLENOL) 325 MG tablet Take 650 mg by mouth every 6 (six) hours as needed for mild pain or headache.      metoprolol succinate (TOPROL-XL) 25 MG 24 hr tablet Take by mouth.     ondansetron (ZOFRAN) 8 MG tablet Take by mouth.     No current facility-administered medications on file prior to visit.    No Known Allergies  Social History:  reports  that she has never smoked. She has never used smokeless tobacco. She reports that she does not drink alcohol and does not use drugs.  Family History  Problem Relation Age of Onset   Anxiety disorder Mother    Hypertension Mother    Heart murmur Mother    Breast cancer Maternal Aunt    Breast cancer Paternal Grandmother    Cervical cancer Maternal Aunt    Stroke Maternal Grandmother     The following portions of the patient's history were reviewed and updated as appropriate: allergies, current medications, past family history, past medical history, past social history, past surgical history and problem list.  Review of Systems Pertinent items noted in HPI and remainder of comprehensive ROS otherwise negative.  Physical Exam:  BP 119/73   Pulse 69   Resp 16   Ht 5\' 6"  (1.676 m)   Wt 167 lb (75.8 kg)   LMP 12/05/2020   BMI 26.95 kg/m  CONSTITUTIONAL: Well-developed, well-nourished female in no acute distress.  HENT:  Normocephalic, atraumatic EYES: Conjunctivae and EOM are normal.  NECK: Normal range  of motion SKIN: Skin is warm and dry.  ABDOMEN: Soft, no distention noted.  No tenderness, rebound or guarding.  PELVIC: erythema noted to external genitalia and urethral meatus; normal appearing vaginal mucosa and cervix.  No abnormal vaginal discharge noted.  Normal uterine size, no other palpable masses, no uterine or adnexal tenderness.  Performed in the presence of a chaperone.   Assessment and Plan:   1. Vaginal itching  - Cervicovaginal ancillary only( [place])  2. Vaginal irritation  RX: clotrimazole   3. Screen for STD (sexually transmitted disease)    Quentina Fronek, Harolyn Rutherford, NP [facility] [employer], [facility]

## 2020-12-27 LAB — CERVICOVAGINAL ANCILLARY ONLY
Bacterial Vaginitis (gardnerella): NEGATIVE
Candida Glabrata: NEGATIVE
Candida Vaginitis: POSITIVE — AB
Chlamydia: NEGATIVE
Comment: NEGATIVE
Comment: NEGATIVE
Comment: NEGATIVE
Comment: NEGATIVE
Comment: NEGATIVE
Comment: NORMAL
Neisseria Gonorrhea: NEGATIVE
Trichomonas: NEGATIVE

## 2020-12-28 ENCOUNTER — Other Ambulatory Visit: Payer: Self-pay

## 2020-12-28 DIAGNOSIS — B379 Candidiasis, unspecified: Secondary | ICD-10-CM

## 2020-12-28 MED ORDER — FLUCONAZOLE 150 MG PO TABS: 150.0000 mg | ORAL_TABLET | Freq: Once | ORAL | 0 refills | Status: AC

## 2020-12-28 NOTE — Progress Notes (Signed)
Aptima showed yeast infection. Rx for Diflucan sent. Pt is aware of results and Rx through MyChart.

## 2021-01-26 ENCOUNTER — Ambulatory Visit: Payer: Medicaid Other

## 2021-01-27 ENCOUNTER — Ambulatory Visit: Payer: Medicaid Other

## 2021-02-20 ENCOUNTER — Emergency Department
Admission: EM | Admit: 2021-02-20 | Discharge: 2021-02-20 | Disposition: A | Discharge status: Home / Self Care | Payer: Medicaid Other | Source: Home / Self Care | Attending: Family Medicine | Admitting: Family Medicine

## 2021-02-20 ENCOUNTER — Other Ambulatory Visit: Payer: Self-pay

## 2021-02-20 ENCOUNTER — Emergency Department: Admit: 2021-02-20 | Discharge status: Absent without leave | Payer: Self-pay

## 2021-02-20 DIAGNOSIS — J029 Acute pharyngitis, unspecified: Secondary | ICD-10-CM | POA: Diagnosis not present

## 2021-02-20 DIAGNOSIS — Z20818 Contact with and (suspected) exposure to other bacterial communicable diseases: Secondary | ICD-10-CM | POA: Diagnosis not present

## 2021-02-20 LAB — POCT RAPID STREP A (OFFICE): Rapid Strep A Screen: NEGATIVE

## 2021-02-20 MED ORDER — PENICILLIN V POTASSIUM 500 MG PO TABS: 500.0000 mg | ORAL_TABLET | Freq: Two times a day (BID) | ORAL | 0 refills | Status: AC

## 2021-02-20 MED ORDER — FLUCONAZOLE 150 MG PO TABS: ORAL_TABLET | ORAL | 0 refills | Status: DC

## 2021-02-20 NOTE — Discharge Instructions (Signed)
Take penicillin 2 times a day Check MyChart.  Test results If the strep test is negative, you may stop the penicillin as soon as you feel better Take Diflucan if needed

## 2021-02-20 NOTE — ED Triage Notes (Signed)
Pt presents with sore throat x1 week, positive strep exposure

## 2021-02-20 NOTE — ED Provider Notes (Signed)
[facility]    CSN: [id number] Arrival date & time: 02/20/21  1316      History   Chief Complaint Chief Complaint  Patient presents with   Sore Throat    HPI Rebekah Smith is a 35 y.o. female.   HPI  Patient has a painful sore throat.  Her mother just got over strep throat.  She denies been visiting her mother.  She is worried about strep.  She has headache.  No runny nose.  No fever or chills.  Past Medical History:  Diagnosis Date   Abnormal Pap smear of cervix    colposcopy   Anemia    Blood transfusion    Hemorrhage    History of preeclampsia    Had in two last pregnancies, denies chronic hypertension as she is normotensive when not pregnant   Migraines    Pregnancy induced hypertension     Patient Active Problem List   Diagnosis Date Noted   Vaginal irritation 12/23/2020   Screen for STD (sexually transmitted disease) 12/23/2020   Family history of breast cancer 06/30/2020   Chest pain 03/14/2020   Peptic ulcer disease 03/14/2020   Costochondritis 03/14/2020   ASCUS with positive high risk HPV cervical 04/13/2019   Bunion of left foot 01/28/2017   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 Authorizing Provider  fluconazole (DIFLUCAN) 150 MG tablet Take if needed for yeast infection symptom 02/20/21  Yes Eustace Moore, MD  penicillin v potassium (VEETID) 500 MG tablet Take 1 tablet (500 mg total) by mouth 2 (two) times daily for 10 days. 02/20/21 03/02/21 Yes Eustace Moore, MD   acetaminophen (TYLENOL) 325 MG tablet Take 650 mg by mouth every 6 (six) hours as needed for mild pain or headache.     [provider]  metoprolol succinate (TOPROL-XL) 25 MG 24 hr tablet Take by mouth. 02/04/20   [provider]    Family History Family History  Problem Relation Age of Onset   Anxiety disorder Mother    Hypertension Mother    Heart murmur Mother    Breast cancer Maternal Aunt    Breast cancer Paternal Grandmother    Cervical cancer Maternal Aunt    Stroke Maternal Grandmother     Social History Social History   Tobacco Use   Smoking status: Never   Smokeless tobacco: Never  Vaping Use   Vaping Use: Never used  Substance Use Topics   Alcohol use: No   Drug use: No     Allergies   Patient has no known allergies.   Review of Systems Review of Systems See HPI  Physical Exam Triage Vital Signs ED Triage Vitals  Enc Vitals Group     BP 02/20/21 1332 130/84  Pulse Rate 02/20/21 1332 80     Resp 02/20/21 1332 14     Temp 02/20/21 1332 98 F (36.7 C)     Temp Source 02/20/21 1332 Oral     SpO2 02/20/21 1332 100 %     Weight --      Height --      Head Circumference --      Peak Flow --      Pain Score 02/20/21 1333 6     Pain Loc --      Pain Edu? --      Excl. in [place]? --    No data found.  Updated Vital Signs BP 130/84 (BP Location: Right Arm)   Pulse 80   Temp 98 F (36.7 C) (Oral)   Resp 14   SpO2 100%    :     Physical Exam Constitutional:      General: She is not in acute distress.    Appearance: She is well-developed.  HENT:     Head: Normocephalic and atraumatic.     Right Ear: Tympanic membrane and ear canal normal. Tympanic membrane is not erythematous.     Left Ear: Tympanic membrane and ear canal normal. Tympanic membrane is not erythematous.     Nose: No congestion or rhinorrhea.     Mouth/Throat:     Mouth: Mucous membranes are moist.     Pharynx: Uvula midline. Posterior oropharyngeal  erythema present.     Tonsils: No tonsillar exudate. 0 on the right. 0 on the left.  Eyes:     Conjunctiva/sclera: Conjunctivae normal.     Pupils: Pupils are equal, round, and reactive to light.  Cardiovascular:     Rate and Rhythm: Normal rate.  Pulmonary:     Effort: Pulmonary effort is normal. No respiratory distress.  Abdominal:     General: There is no distension.     Palpations: Abdomen is soft.  Musculoskeletal:        General: Normal range of motion.     Cervical back: Normal range of motion.  Lymphadenopathy:     Cervical: Cervical adenopathy present.  Skin:    General: Skin is warm and dry.  Neurological:     Mental Status: She is alert.  Psychiatric:        Mood and Affect: Mood normal.        Behavior: Behavior normal.     UC Treatments / Results  Labs (all labs ordered are listed, but only abnormal results are displayed) Labs Reviewed  CULTURE, GROUP A STREP  POCT RAPID STREP A (OFFICE)    EKG   Radiology No results found.  Procedures Procedures (including critical care time)  Medications Ordered in UC Medications - No data to display  Initial Impression / Assessment and Plan / UC Course  I have reviewed the triage vital signs and the nursing notes.  Pertinent labs & imaging results that were available during my care of the patient were reviewed by me and considered in my medical decision making (see chart for details).     Rapid strep is negative.  Because of the close exposure to strep I am going to cover her with antibiotics until her throat culture is available. Final Clinical Impressions(s) / UC Diagnoses   Final diagnoses:  Sore throat  Exposure to strep throat     Discharge Instructions      Take penicillin 2 times a day Check MyChart.  Test results If the strep test  is negative, you may stop the penicillin as soon as you feel better Take Diflucan if needed   ED Prescriptions     Medication Sig Dispense Auth. Provider    penicillin v potassium (VEETID) 500 MG tablet Take 1 tablet (500 mg total) by mouth 2 (two) times daily for 10 days. 20 tablet Raylene Everts, MD   fluconazole (DIFLUCAN) 150 MG tablet Take if needed for yeast infection symptom 1 tablet Raylene Everts, MD      PDMP not reviewed this encounter.   Raylene Everts, MD 02/21/21 [phone]

## 2021-02-23 LAB — CULTURE, GROUP A STREP: Strep A Culture: NEGATIVE

## 2021-06-23 ENCOUNTER — Telehealth: Payer: Self-pay | Admitting: *Deleted

## 2021-06-23 NOTE — Telephone Encounter (Signed)
Patient would like to talk to Kellogg, RN about positive pregnancy test and what she needs to do prior to the office seeing her at 10 weeks. Mervyn Gay, RN is not in the office today, but office will leave a message for Mervyn Gay, RN to return her call on 06/26/2021. Patient agreed. ?

## 2021-06-27 ENCOUNTER — Telehealth: Payer: Medicaid Other | Admitting: Obstetrics and Gynecology

## 2021-06-27 DIAGNOSIS — R000 Tachycardia, unspecified: Secondary | ICD-10-CM | POA: Diagnosis not present

## 2021-06-27 DIAGNOSIS — Z3201 Encounter for pregnancy test, result positive: Secondary | ICD-10-CM | POA: Insufficient documentation

## 2021-06-27 NOTE — Progress Notes (Signed)
? ? ?  TELEHEALTH OBSTETRICS VISIT ENCOUNTER NOTE ? ?Provider location: [facility] [place]  ? ?Patient location: Home ? ?I connected with Rebekah Smith on 06/27/21 at  3:30 PM EDT by telephone at home and verified that I am speaking with the correct person using two identifiers.  ?I discussed the limitations, risks, security and privacy concerns of performing an evaluation and management service by telephone and the availability of in person appointments. I also discussed with the patient that there may be a patient responsible charge related to this service. The patient expressed understanding and agreed to proceed. ? ?Subjective:  ?Rebekah Smith is a 36 y.o. [phone] at Unknown being followed for pregnancy related questions. She recently found out she is pregnant.  Certain LML @ 5 weeks, nervous given history of preeclampsia, preterm labor, and PP hemorrhage.  ? ?On metoprolol 25 mg daily for tachycardia post covid.  ?This is an unplanned pregnancy. ? ? ? ?The following portions of the patient's history were reviewed and updated as appropriate: allergies, current medications, past family history, past medical history, past social history, past surgical history and problem list.  ? ?Objective:  ?There were no vitals taken for this visit. ?General:  Alert, oriented and cooperative.   ?Mental Status: Normal mood and affect perceived. Normal judgment and thought content.  ?Rest of physical exam deferred due to type of encounter ? ?Assessment and Plan:  ?Pregnancy: [id number] at Unknown ? ?1. Tachycardia ? ?- post covid related  ?- Ambulatory referral to Cardiology ?- Continue metoprolol- will let cardiology weigh in.  ? ?2. Pregnancy test positive ? ?Keep your OB visit that is schedule ?Reassurance given  ?Rest, drink fluids, eat well. Take care of yourself.  ?Prenatal vitamins daily ?Plan for BASA at 12 weeks.  ?If Pain, bleeding she should go to MAU for evaluation  ? ?Preterm labor  symptoms and general obstetric precautions including but not limited to vaginal bleeding, contractions, leaking of fluid and fetal movement were reviewed in detail with the patient.  ?I discussed the assessment and treatment plan with the patient. The patient was provided an opportunity to ask questions and all were answered. The patient agreed with the plan and demonstrated an understanding of the instructions. The patient was advised to call back or seek an in-person office evaluation/go to [facility]U at [facility] for any urgent or concerning symptoms. ?Please refer to After Visit Summary for other counseling recommendations.  ? ?I provided 14 minutes of non-face-to-face time during this encounter. ? ?No follow-ups on file. ? ?Future Appointments  ?Date Time Provider Department Center  ?07/24/2021  8:50 AM Penne Lash, Fredrich Romans, MD [facility]  ? ? ?Venia Carbon, NP ?[facility] ? ?  ?

## 2021-07-13 ENCOUNTER — Telehealth: Payer: Self-pay | Admitting: *Deleted

## 2021-07-13 MED ORDER — DOXYLAMINE-PYRIDOXINE 10-10 MG PO TBEC: 1.0000 | DELAYED_RELEASE_TABLET | Freq: Two times a day (BID) | ORAL | 3 refills | Status: DC

## 2021-07-13 NOTE — Telephone Encounter (Signed)
Pt called requesting something for her nausea in early pregnancy.  She has a NOB appt on 07/24/21 with Dr Penne Lash.  Per protocol Diclegis sent to her pharmacy. ?

## 2021-07-24 ENCOUNTER — Encounter: Payer: Self-pay | Admitting: Obstetrics & Gynecology

## 2021-07-24 ENCOUNTER — Other Ambulatory Visit (HOSPITAL_COMMUNITY)
Admission: RE | Admit: 2021-07-24 | Discharge: 2021-07-24 | Disposition: A | Discharge status: Home / Self Care | Payer: Medicaid Other | Source: Ambulatory Visit | Attending: Obstetrics & Gynecology | Admitting: Obstetrics & Gynecology

## 2021-07-24 ENCOUNTER — Ambulatory Visit (INDEPENDENT_AMBULATORY_CARE_PROVIDER_SITE_OTHER): Payer: Medicaid Other | Admitting: Obstetrics & Gynecology

## 2021-07-24 VITALS — BP 117/80 | HR 76 | Wt 165.0 lb

## 2021-07-24 DIAGNOSIS — O09299 Supervision of pregnancy with other poor reproductive or obstetric history, unspecified trimester: Secondary | ICD-10-CM

## 2021-07-24 DIAGNOSIS — Z3A09 9 weeks gestation of pregnancy: Secondary | ICD-10-CM

## 2021-07-24 DIAGNOSIS — O09291 Supervision of pregnancy with other poor reproductive or obstetric history, first trimester: Secondary | ICD-10-CM

## 2021-07-24 DIAGNOSIS — Z3480 Encounter for supervision of other normal pregnancy, unspecified trimester: Secondary | ICD-10-CM | POA: Insufficient documentation

## 2021-07-24 NOTE — Progress Notes (Signed)
?Subjective:  ? ? Rebekah Smith is a [id number] [redacted]w[redacted]d being seen today for her first obstetrical visit.  Her obstetrical history is significant for pre-eclampsia and post partum hemorrhage . Patient does intend to breast feed. Pregnancy history fully reviewed. ? ?Patient reports nausea.  She is taking diclegis.  NO vomiting.  She has history of reflux but is not having symptoms right now.  ? ?Vitals:  ? 07/24/21 0851  ?BP: 117/80  ?Pulse: 76  ?Weight: 165 lb (74.8 kg)  ? ? ? ?HISTORY: ?OB History  ?Gravida Para Term Preterm AB Living  ?6 4 2 2 1 4   ?SAB IAB Ectopic Multiple Live Births  ?1     0 4  ?  ?# Outcome Date GA Lbr Len/2nd Weight Sex Delivery Anes PTL Lv  ?6 Current           ?5 Preterm 07/14/16 [redacted]w[redacted]d 21:04 / 00:15 7 lb 4.4 oz (3.3 kg) M Vag-Spont EPI  LIV  ?   Birth Comments: wnl  ?4 Preterm 04/30/14 [redacted]w[redacted]d / 00:14 6 lb 2.1 oz (2.781 kg) M Vag-Spont EPI  LIV  ?   Birth Comments: within normal limits  ?3 SAB 06/09/13          ?2 Term 09/01/10 [redacted]w[redacted]d  9 lb 9 oz (4.338 kg) M Vag-Spont EPI N LIV  ?   Birth Comments: PPH  ?1 Term 12/03/07 [redacted]w[redacted]d  8 lb 9 oz (3.884 kg) M Vag-Spont EPI N LIV  ? ?Past Medical History:  ?Diagnosis Date  ? Abnormal Pap smear of cervix   ? colposcopy  ? Anemia   ? Blood transfusion   ? Hemorrhage   ? History of preeclampsia   ? Had in two last pregnancies, denies chronic hypertension as she is normotensive when not pregnant  ? Migraines   ? Pregnancy induced hypertension   ? ?Past Surgical History:  ?Procedure Laterality Date  ? TONSILLECTOMY    ? TONSILLECTOMY    ? ?Family History  ?Problem Relation Age of Onset  ? Anxiety disorder Mother   ? Hypertension Mother   ? Heart murmur Mother   ? Breast cancer Maternal Aunt   ? Breast cancer Paternal Grandmother   ? Cervical cancer Maternal Aunt   ? Stroke Maternal Grandmother   ? ? ? ?Exam  ? ? ?Uterus:     ?Pelvic Exam:   ? Perineum: Normal Perineum  ? Vulva: normal  ? Vagina:  normal mucosa, normal discharge  ? pH: N/a  ? Cervix:  multiparous appearance  ? Adnexa: normal adnexa  ? Bony Pelvis: average  ?System: Breast:  normal appearance, no masses or tenderness  ? Skin: normal coloration and turgor, no rashes ?  ? Neurologic: oriented, normal mood  ? Extremities: normal strength, tone, and muscle mass  ? HEENT extra ocular movement intact, sclera clear, anicteric, oropharynx clear, no lesions, neck supple with midline trachea, thyroid without masses, and trachea midline  ? Mouth/Teeth mucous membranes moist, pharynx normal without lesions and dental hygiene good  ? Neck supple and no masses  ? Cardiovascular: regular rate and rhythm  ? Respiratory:  appears well, vitals normal, no respiratory distress, acyanotic, normal RR, neck free of mass or lymphadenopathy, chest clear, no wheezing, crepitations, rhonchi, normal symmetric air entry  ? Abdomen: soft, non-tender; bowel sounds normal; no masses,  no organomegaly  ? Urinary: urethral meatus normal  ? ? ?  ?Assessment:  ? ? Pregnancy: [redacted]w[redacted]d ?Patient Active Problem List  ?  Diagnosis Date Noted  ? Supervision of other normal pregnancy, antepartum 07/24/2021  ? Tachycardia 06/27/2021  ? Pregnancy test positive 06/27/2021  ? Screen for STD (sexually transmitted disease) 12/23/2020  ? Family history of breast cancer 06/30/2020  ? ASCUS with positive high risk HPV cervical 04/13/2019  ? 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 History of postpartum hemorrhage 11/16/2013  ? ?  ? ?  ?Plan:  ? ?  ?Initial labs drawn. ?Prenatal vitamins. ?Problem list reviewed and updated. ?Genetic Screening discussed:  Panorama at 12 weeks and AFP at 15-18 weeks ? Ultrasound discussed; fetal survey: requested.  18-20 weeks  ?  ? History of Pre E--CMP,protein/creatinine ratio, baby asa at 13 weeks; BP cuff given and to report readings >140/90 ?Hx of PPH--will give tXA at delivery and be ready with JADA if necessary as well as implement the rest of the [facility] protocol.  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  New FOB (last pregnancy, FOB was Kell negative).  ?Tachycardia--sees [facility] cards.  Is on metropolol and has not discussed ever being taken off.  Pt would like to stop.  She will stop the metoprolol and let us know her pulse rate.   ? ?Elsie Lincoln ?07/24/2021 ? ? ?

## 2021-07-24 NOTE — Progress Notes (Signed)
Bedside U/S shows single IUP with FHT of  171 BPM and CRL 16.43mm  GA is 8w.  Pt does state that her menses are very irregular. ?

## 2021-07-25 ENCOUNTER — Encounter: Payer: Self-pay | Admitting: Obstetrics & Gynecology

## 2021-07-25 LAB — CERVICOVAGINAL ANCILLARY ONLY
Bacterial Vaginitis (gardnerella): NEGATIVE
Chlamydia: NEGATIVE
Comment: NEGATIVE
Comment: NEGATIVE
Comment: NORMAL
Neisseria Gonorrhea: NEGATIVE

## 2021-07-25 LAB — PROTEIN / CREATININE RATIO, URINE
Creatinine, Urine: 168 mg/dL (ref 20–275)
Protein/Creat Ratio: 65 mg/g creat (ref 24–184)
Protein/Creatinine Ratio: 0.065 mg/mg creat (ref 0.024–0.184)
Total Protein, Urine: 11 mg/dL (ref 5–24)

## 2021-07-26 LAB — OBSTETRIC PANEL
Absolute Monocytes: 524 cells/uL (ref 200–950)
Antibody Screen: POSITIVE — AB
Basophils Absolute: 41 cells/uL (ref 0–200)
Basophils Relative: 0.6 %
Eosinophils Absolute: 48 cells/uL (ref 15–500)
Eosinophils Relative: 0.7 %
HCT: 38.1 % (ref 35.0–45.0)
Hemoglobin: 13.1 g/dL (ref 11.7–15.5)
Hepatitis B Surface Ag: NONREACTIVE
Lymphs Abs: 1401 cells/uL (ref 850–3900)
MCH: 30.3 pg (ref 27.0–33.0)
MCHC: 34.4 g/dL (ref 32.0–36.0)
MCV: 88.2 fL (ref 80.0–100.0)
MPV: 11.9 fL (ref 7.5–12.5)
Monocytes Relative: 7.7 %
Neutro Abs: 4787 cells/uL (ref 1500–7800)
Neutrophils Relative %: 70.4 %
Platelets: 220 10*3/uL (ref 140–400)
RBC: 4.32 10*6/uL (ref 3.80–5.10)
RDW: 12.5 % (ref 11.0–15.0)
RPR Ser Ql: NONREACTIVE
Rubella: 1.44 Index
Total Lymphocyte: 20.6 %
WBC: 6.8 10*3/uL (ref 3.8–10.8)

## 2021-07-26 LAB — COMPREHENSIVE METABOLIC PANEL
AG Ratio: 2.2 (calc) (ref 1.0–2.5)
ALT: 7 U/L (ref 6–29)
AST: 12 U/L (ref 10–30)
Albumin: 4.4 g/dL (ref 3.6–5.1)
Alkaline phosphatase (APISO): 36 U/L (ref 31–125)
BUN: 12 mg/dL (ref 7–25)
CO2: 27 mmol/L (ref 20–32)
Calcium: 9.3 mg/dL (ref 8.6–10.2)
Chloride: 104 mmol/L (ref 98–110)
Creat: 0.64 mg/dL (ref 0.50–0.97)
Globulin: 2 g/dL (calc) (ref 1.9–3.7)
Glucose, Bld: 79 mg/dL (ref 65–139)
Potassium: 4.1 mmol/L (ref 3.5–5.3)
Sodium: 137 mmol/L (ref 135–146)
Total Bilirubin: 0.5 mg/dL (ref 0.2–1.2)
Total Protein: 6.4 g/dL (ref 6.1–8.1)

## 2021-07-26 LAB — ANTIBODY ID, TITER, AND TYPING, RBC: Antigen Typing (2): NEGATIVE

## 2021-07-26 LAB — CULTURE, OB URINE

## 2021-07-26 LAB — HEPATITIS C ANTIBODY
Hepatitis C Ab: NONREACTIVE
SIGNAL TO CUT-OFF: 0.1 (ref <1.00)

## 2021-07-26 LAB — HIV ANTIBODY (ROUTINE TESTING W REFLEX): HIV 1&2 Ab, 4th Generation: NONREACTIVE

## 2021-07-26 LAB — TSH: TSH: 0.71 mIU/L

## 2021-07-26 LAB — FERRITIN: Ferritin: 5 ng/mL — ABNORMAL LOW (ref 16–154)

## 2021-07-26 LAB — URINE CULTURE, OB REFLEX

## 2021-07-27 ENCOUNTER — Other Ambulatory Visit: Payer: Self-pay | Admitting: *Deleted

## 2021-07-27 DIAGNOSIS — Z3480 Encounter for supervision of other normal pregnancy, unspecified trimester: Secondary | ICD-10-CM

## 2021-07-27 LAB — CYTOLOGY - PAP
Comment: NEGATIVE
Diagnosis: NEGATIVE
High risk HPV: NEGATIVE

## 2021-07-27 NOTE — Progress Notes (Signed)
Amb referral placed for MFM genetics per Dr Penne Lash. ?

## 2021-07-28 NOTE — Progress Notes (Deleted)
Rebekah Beckmann, MD Reason for referral-palpitations  HPI: 36 year old female for evaluation of palpitations at request of Doralee Albino, MD.  Previously followed by Dyane Dustman, MD. Stress echocardiogram October 2021 showed normal LV function, mild tricuspid regurgitation; no ischemia. Monitor November 2021 by report shows sinus rhythm with PACs and PVCs.  Patient has been on metoprolol for her palpitations.  Recently found to be pregnant.  Current Outpatient Medications  Medication Sig Dispense Refill   acetaminophen (TYLENOL) 325 MG tablet Take 650 mg by mouth every 6 (six) hours as needed for mild pain or headache.  (Patient not taking: Reported on 07/24/2021)     Doxylamine-Pyridoxine (DICLEGIS) 10-10 MG TBEC Take 1 tablet by mouth 2 (two) times daily. (Patient not taking: Reported on 07/24/2021) 60 tablet 3   metoprolol succinate (TOPROL-XL) 25 MG 24 hr tablet Take by mouth.     Prenatal Vit-Fe Fumarate-FA (MULTIVITAMIN-PRENATAL) 27-0.8 MG TABS tablet Take 1 tablet by mouth daily at 12 noon.     No current facility-administered medications for this visit.    No Known Allergies   Past Medical History:  Diagnosis Date   Abnormal Pap smear of cervix    colposcopy   Anemia    Blood transfusion    Hemorrhage    History of preeclampsia    Had in two last pregnancies, denies chronic hypertension as she is normotensive when not pregnant   Migraines    Pregnancy induced hypertension     Past Surgical History:  Procedure Laterality Date   TONSILLECTOMY     TONSILLECTOMY      Social History   Socioeconomic History   Marital status: Married    Spouse name: Not on file   Number of children: Not on file   Years of education: Not on file   Highest education level: Not on file  Occupational History   Occupation: Actuary    Employer: [employer]  Tobacco Use   Smoking status: Never   Smokeless tobacco: Never  Vaping Use   Vaping Use: Never used   Substance and Sexual Activity   Alcohol use: No   Drug use: No   Sexual activity: Yes    Partners: Male    Birth control/protection: None  Other Topics Concern   Not on file  Social History Narrative   Not on file   Social Determinants of Health   Financial Resource Strain: Not on file  Food Insecurity: Not on file  Transportation Needs: Not on file  Physical Activity: Not on file  Stress: Not on file  Social Connections: Not on file  Intimate Partner Violence: Not on file    Family History  Problem Relation Age of Onset   Anxiety disorder Mother    Hypertension Mother    Heart murmur Mother    Breast cancer Maternal Aunt    Breast cancer Paternal Grandmother    Cervical cancer Maternal Aunt    Stroke Maternal Grandmother     ROS: no fevers or chills, productive cough, hemoptysis, dysphasia, odynophagia, melena, hematochezia, dysuria, hematuria, rash, seizure activity, orthopnea, PND, pedal edema, claudication. Remaining systems are negative.  Physical Exam:   Last menstrual period 05/19/2021.  General:  Well developed/well nourished in NAD Skin warm/dry Patient not depressed No peripheral clubbing Back-normal HEENT-normal/normal eyelids Neck supple/normal carotid upstroke bilaterally; no bruits; no JVD; no thyromegaly chest - CTA/ normal expansion CV - RRR/normal S1 and S2; no murmurs, rubs or gallops;  PMI nondisplaced Abdomen -NT/ND, no HSM,  no mass, + bowel sounds, no bruit 2+ femoral pulses, no bruits Ext-no edema, chords, 2+ DP Neuro-grossly nonfocal  ECG - personally reviewed  A/P  1 palpitations/tachycardia-  Olga Millers, MD

## 2021-07-31 ENCOUNTER — Encounter: Payer: Self-pay | Admitting: Obstetrics & Gynecology

## 2021-07-31 ENCOUNTER — Telehealth: Payer: Self-pay

## 2021-07-31 NOTE — Telephone Encounter (Signed)
Per Analyssa - patient needs [place] around 12 weeks and MD consult for Kell Antibodies. Left voicemail for the patient and cancelled [facility] appointment.  ?

## 2021-08-02 ENCOUNTER — Ambulatory Visit (INDEPENDENT_AMBULATORY_CARE_PROVIDER_SITE_OTHER): Payer: Medicaid Other

## 2021-08-02 DIAGNOSIS — R300 Dysuria: Secondary | ICD-10-CM

## 2021-08-02 DIAGNOSIS — O26891 Other specified pregnancy related conditions, first trimester: Secondary | ICD-10-CM

## 2021-08-02 NOTE — Progress Notes (Signed)
Pt here for nurse visit to drop off urine sample due to dysuria. Pt is [redacted]w[redacted]d pregnant.  Pt states she began taking OTC Azo. Urine culture sent. Pt is aware we will contact with results and treat if necessary.  ?

## 2021-08-06 LAB — CULTURE, OB URINE

## 2021-08-06 LAB — URINE CULTURE, OB REFLEX

## 2021-08-07 ENCOUNTER — Other Ambulatory Visit: Payer: Self-pay

## 2021-08-07 DIAGNOSIS — O2340 Unspecified infection of urinary tract in pregnancy, unspecified trimester: Secondary | ICD-10-CM

## 2021-08-07 MED ORDER — NITROFURANTOIN MONOHYD MACRO 100 MG PO CAPS: 100.0000 mg | ORAL_CAPSULE | Freq: Two times a day (BID) | ORAL | 0 refills | Status: DC

## 2021-08-07 NOTE — Progress Notes (Signed)
Macrobid 100mg  BID x 7 days sent for UTI per Dr.Leggett ?

## 2021-08-09 ENCOUNTER — Ambulatory Visit: Payer: Medicaid Other | Admitting: Cardiology

## 2021-08-14 ENCOUNTER — Encounter: Payer: Self-pay | Admitting: Obstetrics & Gynecology

## 2021-08-14 DIAGNOSIS — R790 Abnormal level of blood mineral: Secondary | ICD-10-CM | POA: Insufficient documentation

## 2021-08-21 ENCOUNTER — Encounter: Payer: Medicaid Other | Admitting: Obstetrics & Gynecology

## 2021-08-24 ENCOUNTER — Encounter: Payer: Self-pay | Admitting: Obstetrics & Gynecology

## 2021-08-31 ENCOUNTER — Other Ambulatory Visit: Payer: Medicaid Other

## 2021-08-31 ENCOUNTER — Other Ambulatory Visit: Payer: Self-pay

## 2021-08-31 ENCOUNTER — Ambulatory Visit: Payer: Medicaid Other

## 2021-08-31 DIAGNOSIS — O039 Complete or unspecified spontaneous abortion without complication: Secondary | ICD-10-CM

## 2021-09-05 ENCOUNTER — Encounter: Payer: Self-pay | Admitting: *Deleted

## 2021-09-07 ENCOUNTER — Other Ambulatory Visit: Payer: Medicaid Other

## 2021-09-11 ENCOUNTER — Encounter: Payer: Medicaid Other | Admitting: Obstetrics & Gynecology

## 2021-09-11 ENCOUNTER — Ambulatory Visit (INDEPENDENT_AMBULATORY_CARE_PROVIDER_SITE_OTHER): Payer: Medicaid Other

## 2021-09-11 DIAGNOSIS — O039 Complete or unspecified spontaneous abortion without complication: Secondary | ICD-10-CM

## 2021-09-12 ENCOUNTER — Ambulatory Visit: Payer: Medicaid Other | Admitting: Cardiology

## 2021-09-18 ENCOUNTER — Encounter: Payer: Self-pay | Admitting: Obstetrics & Gynecology

## 2021-09-19 ENCOUNTER — Encounter: Payer: Self-pay | Admitting: Obstetrics & Gynecology

## 2021-10-09 ENCOUNTER — Other Ambulatory Visit: Payer: Self-pay | Admitting: Obstetrics & Gynecology

## 2021-10-09 DIAGNOSIS — I7789 Other specified disorders of arteries and arterioles: Secondary | ICD-10-CM

## 2021-10-09 NOTE — Progress Notes (Signed)
Discussed ultrasound findings with [place].  She would like to follow-up ultrasound to rule out AV malformation.

## 2021-10-24 ENCOUNTER — Other Ambulatory Visit: Payer: Medicaid Other

## 2021-10-31 ENCOUNTER — Other Ambulatory Visit: Payer: Medicaid Other

## 2021-12-26 ENCOUNTER — Encounter: Payer: Self-pay | Admitting: Emergency Medicine

## 2021-12-26 ENCOUNTER — Ambulatory Visit
Admission: EM | Admit: 2021-12-26 | Discharge: 2021-12-26 | Disposition: A | Discharge status: Home / Self Care | Payer: Medicaid Other | Attending: Family Medicine | Admitting: Family Medicine

## 2021-12-26 ENCOUNTER — Ambulatory Visit
Admission: RE | Admit: 2021-12-26 | Discharge status: Absent without leave | Payer: Medicaid Other | Source: Ambulatory Visit | Attending: Family Medicine | Admitting: Family Medicine

## 2021-12-26 DIAGNOSIS — J020 Streptococcal pharyngitis: Secondary | ICD-10-CM | POA: Diagnosis not present

## 2021-12-26 LAB — POCT RAPID STREP A (OFFICE): Rapid Strep A Screen: POSITIVE — AB

## 2021-12-26 MED ORDER — PENICILLIN V POTASSIUM 500 MG PO TABS: 500.0000 mg | ORAL_TABLET | Freq: Two times a day (BID) | ORAL | 0 refills | Status: AC

## 2021-12-26 MED ORDER — FLUCONAZOLE 150 MG PO TABS: 150.0000 mg | ORAL_TABLET | Freq: Every day | ORAL | 0 refills | Status: DC

## 2021-12-26 NOTE — ED Triage Notes (Signed)
Patient c/o sore throat, headache, sore glands x 1 day.  Patient has taken Ibuprofen this morning.

## 2021-12-26 NOTE — Discharge Instructions (Signed)
You need to take 10 full days of penicillin Take Diflucan at the first sign of symptoms of yeast infection Drink lots of water Take Tylenol or ibuprofen as needed for throat pain Call for problems

## 2021-12-26 NOTE — ED Provider Notes (Signed)
[facility]    CSN: [id number] Arrival date & time: 12/26/21  1107      History   Chief Complaint Chief Complaint  Patient presents with   Appointment    HPI Rebekah Smith is a 36 y.o. female.   HPI  Patient has sore throat, swollen glands, headache starting yesterday.  She has 4 small boys but none of them have been sick with strep throat.  No fever chills or body aches, no concern for COVID  Past Medical History:  Diagnosis Date   Abnormal Pap smear of cervix    colposcopy   Anemia    Blood transfusion    Hemorrhage    History of preeclampsia    Had in two last pregnancies, denies chronic hypertension as she is normotensive when not pregnant   Migraines    Pregnancy induced hypertension     Patient Active Problem List   Diagnosis Date Noted   Low ferritin 08/14/2021   Tachycardia 06/27/2021   Screen for STD (sexually transmitted disease) 12/23/2020   Family history of breast cancer 06/30/2020   ASCUS with positive high risk HPV cervical 04/13/2019   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 Authorizing Provider  fluconazole (DIFLUCAN) 150 MG tablet Take 1 tablet (150 mg total) by mouth daily. Repeat in 1 week if needed 12/26/21  Yes Eustace Moore, MD  metoprolol succinate (TOPROL-XL) 25 MG 24 hr tablet Take by mouth. 02/04/20  Yes [provider]  penicillin v potassium (VEETID) 500 MG tablet Take 1 tablet (500 mg total) by mouth 2 (two) times daily for 10 days. 12/26/21 01/05/22 Yes Eustace Moore, MD   Prenatal Vit-Fe Fumarate-FA (MULTIVITAMIN-PRENATAL) 27-0.8 MG TABS tablet Take 1 tablet by mouth daily at 12 noon.    [provider]    Family History Family History  Problem Relation Age of Onset   Anxiety disorder Mother    Hypertension Mother    Heart murmur Mother    Breast cancer Maternal Aunt    Breast cancer Paternal Grandmother    Cervical cancer Maternal Aunt    Stroke Maternal Grandmother     Social History Social History   Tobacco Use   Smoking status: Never   Smokeless tobacco: Never  Vaping Use   Vaping Use: Never used  Substance Use Topics   Alcohol use: No   Drug use: No     Allergies   Patient has no known allergies.   Review of Systems Review of Systems  See HPI Physical Exam Triage Vital Signs ED Triage Vitals  Enc Vitals Group     BP 12/26/21 1116 119/77     Pulse Rate 12/26/21 1116 69     Resp 12/26/21 1116 18     Temp 12/26/21 1116  98.8 F (37.1 C)     Temp Source 12/26/21 1116 Oral     SpO2 12/26/21 1116 98 %     Weight 12/26/21 1117 160 lb (72.6 kg)     Height 12/26/21 1117 5\' 7"  (1.702 m)     Head Circumference --      Peak Flow --      Pain Score 12/26/21 1117 7     Pain Loc --      Pain Edu? --      Excl. in [place]? --    No data found.  Updated Vital Signs BP 119/77 (BP Location: Right Arm)   Pulse 69   Temp 98.8 F (37.1 C) (Oral)   Resp 18   Ht 5\' 7"  (1.702 m)   Wt 72.6 kg   LMP 12/26/2021   SpO2 98%   Breastfeeding No   BMI 25.06 kg/m      Physical Exam Constitutional:      General: She is not in acute distress.    Appearance: She is well-developed.  HENT:     Head: Normocephalic and atraumatic.     Right Ear: Tympanic membrane and ear canal normal.     Left Ear: Tympanic membrane and ear canal normal.     Nose: Nose normal.     Mouth/Throat:     Pharynx: Posterior oropharyngeal erythema present.  Eyes:     Conjunctiva/sclera: Conjunctivae normal.     Pupils: Pupils are equal, round, and  reactive to light.  Cardiovascular:     Rate and Rhythm: Normal rate.  Pulmonary:     Effort: Pulmonary effort is normal. No respiratory distress.  Abdominal:     General: There is no distension.     Palpations: Abdomen is soft.  Musculoskeletal:        General: Normal range of motion.     Cervical back: Normal range of motion.  Lymphadenopathy:     Cervical: Cervical adenopathy present.  Skin:    General: Skin is warm and dry.  Neurological:     Mental Status: She is alert.  Psychiatric:        Mood and Affect: Mood normal.        Behavior: Behavior normal.      UC Treatments / Results  Labs (all labs ordered are listed, but only abnormal results are displayed) Labs Reviewed  POCT RAPID STREP A (OFFICE) - Abnormal; Notable for the following components:      Result Value   Rapid Strep A Screen Positive (*)    All other components within normal limits    EKG   Radiology No results found.  Procedures Procedures (including critical care time)  Medications Ordered in UC Medications - No data to display  Initial Impression / Assessment and Plan / UC Course  I have reviewed the triage vital signs and the nursing notes.  Pertinent labs & imaging results that were available during my care of the patient were reviewed by me and considered in my medical decision making (see chart for details).     Final Clinical Impressions(s) / UC Diagnoses   Final diagnoses:  Streptococcal sore throat     Discharge Instructions      You need to take 10 full days of penicillin Take Diflucan at the first sign of symptoms of yeast infection Drink lots of water Take Tylenol or ibuprofen as needed for throat pain Call for problems   ED Prescriptions     Medication Sig Dispense  Auth. Provider   penicillin v potassium (VEETID) 500 MG tablet Take 1 tablet (500 mg total) by mouth 2 (two) times daily for 10 days. 20 tablet Raylene Everts, MD   fluconazole (DIFLUCAN) 150 MG  tablet Take 1 tablet (150 mg total) by mouth daily. Repeat in 1 week if needed 2 tablet Raylene Everts, MD      PDMP not reviewed this encounter.   Raylene Everts, MD 12/26/21 [id number]

## 2021-12-27 ENCOUNTER — Telehealth: Payer: Self-pay

## 2021-12-27 NOTE — Telephone Encounter (Signed)
Called pt to check on status since UC visit. Taking meds and feeling slightly better. Encourage to finish abx. Call if any questions or concerns.

## 2022-01-23 ENCOUNTER — Ambulatory Visit
Admission: EM | Admit: 2022-01-23 | Discharge: 2022-01-23 | Disposition: A | Discharge status: Home / Self Care | Payer: Medicaid Other | Attending: Family Medicine | Admitting: Family Medicine

## 2022-01-23 DIAGNOSIS — Z1152 Encounter for screening for COVID-19: Secondary | ICD-10-CM | POA: Diagnosis not present

## 2022-01-23 DIAGNOSIS — J029 Acute pharyngitis, unspecified: Secondary | ICD-10-CM | POA: Insufficient documentation

## 2022-01-23 NOTE — ED Provider Notes (Signed)
[facility]    CSN: [id number] Arrival date & time: 01/23/22  1928      History   Chief Complaint Chief Complaint  Patient presents with   Fatigue    Fatigue, sore throat, and headache. X1 day    HPI [place] is a 36 y.o. female.   HPI  Patient had strep throat last month.  She had fatigue sore throat and headache.  She took 10 full days of antibiotics.  She states that fatigue sore throat and headache are back and it feels the same.  She is here with one of her sons with similar symptoms  Past Medical History:  Diagnosis Date   Abnormal Pap smear of cervix    colposcopy   Anemia    Blood transfusion    Hemorrhage    History of preeclampsia    Had in two last pregnancies, denies chronic hypertension as she is normotensive when not pregnant   Migraines    Pregnancy induced hypertension     Patient Active Problem List   Diagnosis Date Noted   Low ferritin 08/14/2021   Tachycardia 06/27/2021   Screen for STD (sexually transmitted disease) 12/23/2020   Family history of breast cancer 06/30/2020   ASCUS with positive high risk HPV cervical 04/13/2019   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 Authorizing Provider  metoprolol succinate (TOPROL-XL) 25 MG 24 hr tablet Take by mouth. 02/04/20  Yes [provider]    Family History Family History  Problem Relation Age of Onset   Anxiety disorder Mother    Hypertension Mother    Heart murmur Mother    Breast cancer Maternal Aunt    Breast cancer Paternal  Grandmother    Cervical cancer Maternal Aunt    Stroke Maternal Grandmother     Social History Social History   Tobacco Use   Smoking status: Never   Smokeless tobacco: Never  Vaping Use   Vaping Use: Never used  Substance Use Topics   Alcohol use: No   Drug use: No     Allergies   Patient has no known allergies.   Review of Systems Review of Systems  See HPI Physical Exam Triage Vital Signs ED Triage Vitals  Enc Vitals Group     BP 01/23/22 1943 128/88     Pulse Rate 01/23/22 1943 86     Resp 01/23/22 1943 20     Temp 01/23/22 1943 98.2 F (36.8 C)     Temp Source 01/23/22 1943 Oral     SpO2 01/23/22 1943 99 %     Weight 01/23/22 1941 160 lb (72.6 kg)     Height 01/23/22 1941 5\' 7"  (1.702 m)     Head Circumference --      Peak Flow --  Pain Score 01/23/22 1941 5     Pain Loc --      Pain Edu? --      Excl. in [place]? --    No data found.  Updated Vital Signs BP 128/88 (BP Location: Right Arm)   Pulse 86   Temp 98.2 F (36.8 C) (Oral)   Resp 20   Ht 5\' 7"  (1.702 m)   Wt 72.6 kg   LMP 12/26/2021   SpO2 99%   BMI 25.06 kg/m       Physical Exam Constitutional:      General: She is not in acute distress.    Appearance: She is well-developed.  HENT:     Head: Normocephalic and atraumatic.     Right Ear: Tympanic membrane and ear canal normal.     Left Ear: Tympanic membrane and ear canal normal.     Nose: Nose normal. No congestion.     Mouth/Throat:     Mouth: Mucous membranes are moist.     Pharynx: Posterior oropharyngeal erythema present.  Eyes:     Conjunctiva/sclera: Conjunctivae normal.     Pupils: Pupils are equal, round, and reactive to light.  Cardiovascular:     Rate and Rhythm: Normal rate and regular rhythm.     Heart sounds: Normal heart sounds.  Pulmonary:     Effort: Pulmonary effort is normal. No respiratory distress.     Breath sounds: Normal breath sounds.  Abdominal:     General: There is no distension.      Palpations: Abdomen is soft.  Musculoskeletal:        General: Normal range of motion.     Cervical back: Normal range of motion.  Lymphadenopathy:     Cervical: Cervical adenopathy present.  Skin:    General: Skin is warm and dry.  Neurological:     Mental Status: She is alert.      UC Treatments / Results  Labs (all labs ordered are listed, but only abnormal results are displayed) Labs Reviewed  RESP PANEL BY RT-PCR (FLU A&B, COVID) [id number]  CULTURE, GROUP A STREP [facility])    EKG   Radiology No results found.  Procedures Procedures (including critical care time)  Medications Ordered in UC Medications - No data to display  Initial Impression / Assessment and Plan / UC Course  I have reviewed the triage vital signs and the nursing notes.  Pertinent labs & imaging results that were available during my care of the patient were reviewed by me and considered in my medical decision making (see chart for details).      Final Clinical Impressions(s) / UC Diagnoses   Final diagnoses:  Acute pharyngitis, unspecified etiology     Discharge Instructions      Rest and drink plenty of fluids Take Tylenol or ibuprofen for pain and fever May use Chloraseptic spray or lozenges Check MyChart tomorrow for your test results If you are COVID-positive, you must follow quarantine and isolation protocol     ED Prescriptions   None    PDMP not reviewed this encounter.   Raylene Everts, MD 01/27/22 [phone]

## 2022-01-23 NOTE — Discharge Instructions (Signed)
Rest and drink plenty of fluids Take Tylenol or ibuprofen for pain and fever May use Chloraseptic spray or lozenges Check MyChart tomorrow for your test results If you are COVID-positive, you must follow quarantine and isolation protocol

## 2022-01-23 NOTE — ED Triage Notes (Signed)
Pt states that she has some fatigue, sore throat, and headache. X1 day

## 2022-01-24 LAB — RESP PANEL BY RT-PCR (FLU A&B, COVID) ARPGX2
Influenza A by PCR: NEGATIVE
Influenza B by PCR: NEGATIVE
SARS Coronavirus 2 by RT PCR: NEGATIVE

## 2022-01-26 LAB — CULTURE, GROUP A STREP (THRC)

## 2022-02-28 ENCOUNTER — Other Ambulatory Visit (HOSPITAL_COMMUNITY)
Admission: RE | Admit: 2022-02-28 | Discharge: 2022-02-28 | Disposition: A | Discharge status: Home / Self Care | Payer: Medicaid Other | Source: Ambulatory Visit | Attending: Obstetrics & Gynecology | Admitting: Obstetrics & Gynecology

## 2022-02-28 ENCOUNTER — Ambulatory Visit
Admission: EM | Admit: 2022-02-28 | Discharge: 2022-02-28 | Disposition: A | Discharge status: Home / Self Care | Payer: Medicaid Other | Attending: Family Medicine | Admitting: Family Medicine

## 2022-02-28 ENCOUNTER — Encounter: Payer: Self-pay | Admitting: Obstetrics & Gynecology

## 2022-02-28 ENCOUNTER — Ambulatory Visit: Admit: 2022-02-28 | Discharge status: Absent without leave | Payer: Medicaid Other

## 2022-02-28 ENCOUNTER — Ambulatory Visit: Payer: Medicaid Other | Admitting: Obstetrics & Gynecology

## 2022-02-28 VITALS — BP 133/84 | HR 65 | Resp 16 | Ht 66.0 in | Wt 166.0 lb

## 2022-02-28 DIAGNOSIS — N6322 Unspecified lump in the left breast, upper inner quadrant: Secondary | ICD-10-CM | POA: Diagnosis not present

## 2022-02-28 DIAGNOSIS — R399 Unspecified symptoms and signs involving the genitourinary system: Secondary | ICD-10-CM

## 2022-02-28 DIAGNOSIS — R590 Localized enlarged lymph nodes: Secondary | ICD-10-CM

## 2022-02-28 DIAGNOSIS — H66002 Acute suppurative otitis media without spontaneous rupture of ear drum, left ear: Secondary | ICD-10-CM

## 2022-02-28 DIAGNOSIS — A749 Chlamydial infection, unspecified: Secondary | ICD-10-CM

## 2022-02-28 DIAGNOSIS — J069 Acute upper respiratory infection, unspecified: Secondary | ICD-10-CM | POA: Diagnosis not present

## 2022-02-28 HISTORY — DX: Chlamydial infection, unspecified: A74.9

## 2022-02-28 LAB — POCT URINALYSIS DIPSTICK
Bilirubin, UA: NEGATIVE
Glucose, UA: NEGATIVE
Ketones, UA: NEGATIVE
Nitrite, UA: NEGATIVE
Protein, UA: NEGATIVE
Spec Grav, UA: 1.025 (ref 1.010–1.025)
Urobilinogen, UA: 0.2 E.U./dL
pH, UA: 5 (ref 5.0–8.0)

## 2022-02-28 MED ORDER — AMOXICILLIN-POT CLAVULANATE 875-125 MG PO TABS: 1.0000 | ORAL_TABLET | Freq: Two times a day (BID) | ORAL | 0 refills | Status: DC

## 2022-02-28 MED ORDER — FLUTICASONE PROPIONATE 50 MCG/ACT NA SUSP: 2.0000 | Freq: Every day | NASAL | 0 refills | Status: DC

## 2022-02-28 MED ORDER — FLUCONAZOLE 150 MG PO TABS: 150.0000 mg | ORAL_TABLET | Freq: Every day | ORAL | 0 refills | Status: DC

## 2022-02-28 NOTE — ED Provider Notes (Signed)
[facility]    CSN: [id number] Arrival date & time: 02/28/22  1510      History   Chief Complaint Chief Complaint  Patient presents with   Cough   Sore Throat    HPI Rebekah Smith is a 36 y.o. female.   HPI  Patient states she feels like she has never been well since she had strep throat in September.  She states that every time she turns around she has another cough cold runny nose.  She states that she currently has a cold this been going on for over a week.  Today she woke up with left ear pain and pressure.  Does not usually have ear infections.  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 Authorizing Provider  amoxicillin-clavulanate (AUGMENTIN) 875-125 MG tablet Take 1 tablet by mouth every 12 (twelve) hours. 02/28/22  Yes Eustace Moore, MD  fluconazole (DIFLUCAN) 150 MG tablet Take 1 tablet (150 mg total) by mouth daily. Repeat in 1 week if needed  02/28/22  Yes Eustace Moore, MD  fluticasone [facility]) 50 MCG/ACT nasal spray Place 2 sprays into both nostrils daily. 02/28/22  Yes Eustace Moore, MD  metoprolol succinate (TOPROL-XL) 25 MG 24 hr tablet Take by mouth. 02/04/20   [provider]    Family History Family History  Problem Relation Age of Onset   Anxiety disorder Mother    Hypertension Mother    Heart murmur Mother    Breast cancer Maternal Aunt    Breast cancer Paternal Grandmother    Cervical cancer Maternal Aunt    Stroke Maternal Grandmother     Social History Social History   Tobacco Use   Smoking status: Never   Smokeless tobacco: Never  Vaping Use   Vaping Use: Never used  Substance Use Topics   Alcohol use: No   Drug use: No     Allergies   Patient has no known allergies.   Review of Systems Review of Systems See HPI  Physical Exam Triage Vital Signs ED Triage Vitals [02/28/22 1520]  Enc Vitals Group     BP  127/82     Pulse Rate 69     Resp 17     Temp 98.3 F (36.8 C)     Temp Source Oral     SpO2 99 %     Weight      Height      Head Circumference      Peak Flow      Pain Score 6     Pain Loc      Pain Edu?      Excl. in GC?    No data found.  Updated Vital Signs BP 127/82 (BP Location: Left Arm)   Pulse 69   Temp 98.3 F (36.8 C) (Oral)   Resp 17   LMP 02/02/2022   SpO2 99%       Physical Exam Constitutional:      General: She is not in acute distress.    Appearance: She is well-developed. She is ill-appearing.     Comments: Appears tired  HENT:     Head: Normocephalic and atraumatic.     Right Ear: Tympanic membrane and ear canal normal.     Left Ear: A middle ear effusion is present. Tympanic membrane is erythematous.     Nose: No congestion or rhinorrhea.     Mouth/Throat:     Pharynx: Pharyngeal swelling and posterior oropharyngeal erythema present.     Tonsils: No tonsillar exudate. 1+ on the right. 2+ on the left.  Eyes:      Conjunctiva/sclera: Conjunctivae normal.     Pupils: Pupils are equal, round, and reactive to light.  Cardiovascular:     Rate and Rhythm: Normal rate.  Pulmonary:     Effort: Pulmonary effort is normal. No respiratory distress.  Abdominal:     General: There is no distension.     Palpations: Abdomen is soft.  Musculoskeletal:        General: Normal range of motion.     Cervical back: Normal range of motion.  Skin:    General: Skin is warm and dry.  Neurological:     Mental Status: She is alert.      UC Treatments / Results  Labs (all labs ordered are listed, but only abnormal results are displayed) Labs Reviewed - No data to display  EKG   Radiology No results found.  Procedures Procedures (including critical care time)  Medications Ordered in UC Medications - No data to display  Initial Impression / Assessment and Plan / UC Course  I have reviewed the triage vital signs and the nursing notes.  Pertinent labs & imaging results that were available during my care of the patient were reviewed by me and considered in my medical decision making (see chart for details).     Final Clinical Impressions(s) / UC Diagnoses   Final diagnoses:  Upper respiratory tract infection, unspecified type  Non-recurrent acute suppurative otitis media of left ear without spontaneous rupture of tympanic membrane     Discharge Instructions      Use Flonase for the nasal congestion and ear pain Take antibiotic 2 times a day Use Diflucan if needed Drink lots of water Take Tylenol, Advil, or Aleve as needed for ear pain Follow-up with your primary care doctor   ED Prescriptions     Medication Sig Dispense Auth. Provider   amoxicillin-clavulanate (AUGMENTIN) 875-125 MG tablet Take 1 tablet by mouth every 12 (twelve) hours. 20 tablet Eustace Moore, MD   fluticasone [facility]) 50 MCG/ACT nasal spray Place 2  sprays into both nostrils daily. 16 g Eustace Moore, MD    fluconazole (DIFLUCAN) 150 MG tablet Take 1 tablet (150 mg total) by mouth daily. Repeat in 1 week if needed 2 tablet Eustace Moore, MD      PDMP not reviewed this encounter.   Eustace Moore, MD 02/28/22 [id number]

## 2022-02-28 NOTE — Progress Notes (Signed)
GYNECOLOGY OFFICE VISIT NOTE  History:   Rebekah Smith is a 36 y.o. [phone] here today for evaluation of lymph nodes in bilateral groin area x 2 weeks.  Also noted left breast lump x 2 days.  Also has some pain during urination x 2 days.  Has sore throat and concerned about URI, going to Urgent Care after this encounter.  No fevers, nausea, abnormal vaginal discharge, bleeding, pelvic pain or other concerns.    Past Medical History:  Diagnosis Date   Abnormal Pap smear of cervix    colposcopy   Anemia    Blood transfusion    Hemorrhage    History of preeclampsia    Had in two last pregnancies, denies chronic hypertension as she is normotensive when not pregnant   Migraines    Pregnancy induced hypertension     Past Surgical History:  Procedure Laterality Date   TONSILLECTOMY     TONSILLECTOMY      The following portions of the patient's history were reviewed and updated as appropriate: allergies, current medications, past family history, past medical history, past social history, past surgical history and problem list.   Health Maintenance:  Normal pap on 07/24/2021.   Review of Systems:  Pertinent items noted in HPI and remainder of comprehensive ROS otherwise negative.  Physical Exam:  BP 133/84   Pulse 65   Resp 16   Ht 5\' 6"  (1.676 m)   Wt 166 lb (75.3 kg)   LMP 02/02/2022   BMI 26.79 kg/m  CONSTITUTIONAL: Well-developed, well-nourished female in no acute distress.  HEENT:  Normocephalic, atraumatic. External right and left ear normal. No scleral icterus.  No cervical lymphadenopathy. NECK: Normal range of motion, supple, no masses noted on observation SKIN: No rash noted. Not diaphoretic. No erythema. No pallor. MUSCULOSKELETAL: Normal range of motion. No edema noted. NEUROLOGIC: Alert and oriented to person, place, and time. Normal muscle tone coordination. No cranial nerve deficit noted. PSYCHIATRIC: Normal mood and affect. Normal behavior. Normal judgment  and thought content. CARDIOVASCULAR: Normal heart rate noted RESPIRATORY: Effort and breath sounds normal, no problems with respiration noted ABDOMEN: No masses noted. No other overt distention noted.   BREASTS: Symmetric in size. 1 cm smooth mass in upper inner quadrant of left brreast, mildly tender to touch.   No other masses, tenderness, skin changes, nipple drainage, or axillary lymphadenopathy bilaterally. PELVIC: Small subcentimeter lymph nodes palpated in bilateral inguinal areas, one on each side.  Normal appearing external genitalia; normal urethral meatus; normal appearing vaginal mucosa.  No abnormal discharge noted, testing sample obtained.  Performed in the presence of a chaperone  Labs and Imaging Results for orders placed or performed in visit on 02/28/22 (from the past 168 hour(s))  POCT Urinalysis Dipstick   Collection Time: 02/28/22  2:38 PM  Result Value Ref Range   Color, UA     Clarity, UA     Glucose, UA Negative Negative   Bilirubin, UA neg    Ketones, UA neg    Spec Grav, UA 1.025 1.010 - 1.025   Blood, UA trace    pH, UA 5.0 5.0 - 8.0   Protein, UA Negative Negative   Urobilinogen, UA 0.2 0.2 or 1.0 E.U./dL   Nitrite, UA neg    Leukocytes, UA Trace (A) Negative   Appearance     Odor     No results found.    Assessment and Plan:    1. Mass of upper inner quadrant of left  breast Likely benign but will follow up breast imaging results. - MM DIAG BREAST TOMO BILATERAL; Future - US BREAST LTD UNI LEFT INC AXILLA; Future  2. UTI symptoms - POCT Urinalysis Dipstick negative. - Urine Culture sent, will follow up results and manage accordingly.  3. Lymphadenopathy, inguinal Unsure etiology, will check for infectious etiologies. No other lymph nodes palpated.  She does have a cat, but denies any recent trauma.  If masses persist or worsen over the next few weeks, may need biopsy by General Surgery and/or imaging.   - HIV Antibody (routine testing w rflx) -  RPR - Lymphogranuloma Venereum (LGV) Differentiation Antibody Panel, MIF - Hepatitis C antibody - Hepatitis B surface antigen - Cervicovaginal ancillary only( [place]) - CMV abs, IgG+IgM (cytomegalovirus) - CBC w/Diff - Toxoplasma antibodies- IgG and IgM  Routine preventative health maintenance measures emphasized. Please refer to After Visit Summary for other counseling recommendations.   Return in 4 weeks (on 03/28/2022) for Followup inguinal lymph nodes, breast symptoms.    I spent  35  minutes dedicated to the care of this patient including pre-visit review of records, face to face time with the patient discussing her conditions and treatments and post visit orders.    Jaynie Collins, MD, FACOG Obstetrician & Gynecologist, [facility] [employer], [facility]

## 2022-02-28 NOTE — Discharge Instructions (Signed)
Use Flonase for the nasal congestion and ear pain Take antibiotic 2 times a day Use Diflucan if needed Drink lots of water Take Tylenol, Advil, or Aleve as needed for ear pain Follow-up with your primary care doctor

## 2022-02-28 NOTE — ED Triage Notes (Signed)
Pt c/o cough and sore throat x 1 week. Denies fever. Worsening congestion in last two days. Taking OTC decongestant and ibuprofen prn.

## 2022-03-01 ENCOUNTER — Encounter: Payer: Self-pay | Admitting: Obstetrics & Gynecology

## 2022-03-01 LAB — CERVICOVAGINAL ANCILLARY ONLY
Bacterial Vaginitis (gardnerella): NEGATIVE
Candida Glabrata: NEGATIVE
Candida Vaginitis: NEGATIVE
Chlamydia: POSITIVE — AB
Comment: NEGATIVE
Comment: NEGATIVE
Comment: NEGATIVE
Comment: NEGATIVE
Comment: NEGATIVE
Comment: NORMAL
Neisseria Gonorrhea: NEGATIVE
Trichomonas: NEGATIVE

## 2022-03-01 MED ORDER — DOXYCYCLINE HYCLATE 100 MG PO CAPS: 100.0000 mg | ORAL_CAPSULE | Freq: Two times a day (BID) | ORAL | 1 refills | Status: AC

## 2022-03-01 NOTE — Addendum Note (Signed)
Addended by: Jaynie Collins A on: 03/01/2022 08:06 PM   Modules accepted: Orders

## 2022-03-01 NOTE — Progress Notes (Signed)
Patient has chlamydia.  Negative testing for other STIs, but she needs to let partner(s) know so the partner(s) can get testing and treatment. Patient and sex partner(s) should abstain from unprotected sexual activity for seven days after everyone receives appropriate treatment.  Doxycycline was prescribed for patient.  Patient will need to return in about 4 weeks after treatment for repeat test of cure.  Please call to inform patient of results and recommendations, and advise to pick up prescription and take as directed.  Please advise patient to practice safe sex at all times.

## 2022-03-02 LAB — URINE CULTURE
MICRO NUMBER:: 14251242
SPECIMEN QUALITY:: ADEQUATE

## 2022-03-03 ENCOUNTER — Encounter: Payer: Self-pay | Admitting: Obstetrics & Gynecology

## 2022-03-07 LAB — CBC WITH DIFFERENTIAL/PLATELET
Absolute Monocytes: 607 cells/uL (ref 200–950)
Basophils Absolute: 41 cells/uL (ref 0–200)
Basophils Relative: 0.5 %
Eosinophils Absolute: 82 cells/uL (ref 15–500)
Eosinophils Relative: 1 %
HCT: 39.3 % (ref 35.0–45.0)
Hemoglobin: 13.6 g/dL (ref 11.7–15.5)
Lymphs Abs: 1476 cells/uL (ref 850–3900)
MCH: 30.4 pg (ref 27.0–33.0)
MCHC: 34.6 g/dL (ref 32.0–36.0)
MCV: 87.9 fL (ref 80.0–100.0)
MPV: 11.4 fL (ref 7.5–12.5)
Monocytes Relative: 7.4 %
Neutro Abs: 5994 cells/uL (ref 1500–7800)
Neutrophils Relative %: 73.1 %
Platelets: 281 10*3/uL (ref 140–400)
RBC: 4.47 10*6/uL (ref 3.80–5.10)
RDW: 12.4 % (ref 11.0–15.0)
Total Lymphocyte: 18 %
WBC: 8.2 10*3/uL (ref 3.8–10.8)

## 2022-03-07 LAB — LYMPHOGRANULOMA VENEREUM(LGV)DIFFERENTIATION AB PNL,MIF
C. PSITTACI AB (IGA): <1:16 {titer}
C. Pneumoniae IgA: <1:16 {titer}
C. pneumoniae IgG: 1:64 {titer} — ABNORMAL HIGH
C. pneumoniae IgM: <1:10 {titer}
C. psittaci IgG: <1:64 {titer}
C. psittaci IgM: <1:10 {titer}
C.TRACHOMATIS D-K AB(IGA: <1:16 {titer}
C.TRACHOMATIS D-K AB(IGG): 1:64 {titer} — ABNORMAL HIGH
C.TRACHOMATIS D-K AB(IGM): 1:10 {titer} — ABNORMAL HIGH
C.TRACHOMATIS L1 AB (IGA): 1:16 {titer} — ABNORMAL HIGH
C.TRACHOMATIS L1 AB (IGG): 1:128 {titer} — ABNORMAL HIGH
C.TRACHOMATIS L1 AB (IGM): <1:10 {titer}

## 2022-03-07 LAB — RPR: RPR Ser Ql: NONREACTIVE

## 2022-03-07 LAB — HEPATITIS C ANTIBODY: Hepatitis C Ab: NONREACTIVE

## 2022-03-07 LAB — TOXOPLASMA ANTIBODIES- IGG AND  IGM
Toxoplasma Antibody- IgM: <8 AU/mL
Toxoplasma IgG Ratio: <7.2 IU/mL

## 2022-03-07 LAB — HIV ANTIBODY (ROUTINE TESTING W REFLEX): HIV 1&2 Ab, 4th Generation: NONREACTIVE

## 2022-03-07 LAB — CMV ABS, IGG+IGM (CYTOMEGALOVIRUS)
CMV IgM: <30 AU/mL
Cytomegalovirus Ab-IgG: 7.6 U/mL — ABNORMAL HIGH

## 2022-03-07 LAB — HEPATITIS B SURFACE ANTIGEN: Hepatitis B Surface Ag: NONREACTIVE

## 2022-03-21 ENCOUNTER — Ambulatory Visit
Admission: RE | Admit: 2022-03-21 | Discharge: 2022-03-21 | Disposition: A | Discharge status: Home / Self Care | Payer: Medicaid Other | Source: Ambulatory Visit | Attending: Obstetrics & Gynecology | Admitting: Obstetrics & Gynecology

## 2022-03-21 DIAGNOSIS — N6322 Unspecified lump in the left breast, upper inner quadrant: Secondary | ICD-10-CM

## 2022-03-29 ENCOUNTER — Other Ambulatory Visit (HOSPITAL_COMMUNITY)
Admission: RE | Admit: 2022-03-29 | Discharge: 2022-03-29 | Disposition: A | Discharge status: Home / Self Care | Payer: Medicaid Other | Source: Ambulatory Visit | Attending: Obstetrics and Gynecology | Admitting: Obstetrics and Gynecology

## 2022-03-29 ENCOUNTER — Encounter: Payer: Self-pay | Admitting: Obstetrics and Gynecology

## 2022-03-29 ENCOUNTER — Ambulatory Visit (INDEPENDENT_AMBULATORY_CARE_PROVIDER_SITE_OTHER): Payer: Medicaid Other | Admitting: Obstetrics and Gynecology

## 2022-03-29 VITALS — BP 137/86 | HR 59 | Resp 16 | Ht 66.0 in | Wt 166.0 lb

## 2022-03-29 DIAGNOSIS — A749 Chlamydial infection, unspecified: Secondary | ICD-10-CM

## 2022-03-29 NOTE — Progress Notes (Signed)
   RETURN GYNECOLOGY VISIT  Subjective:  Rebekah Smith is a 36 y.o. w/ recent chlamydia infection, inguinal lymphadenopathy and breast lump presenting for follow up.   Completed treatment for CT. Lymphadenopathy resolved.  Breast US & mammogram benign (BIRADS 1) Longstanding cycles q45-60d, had abnormal light bleeding this cycle w/ lower abd pain. Is concerned about PID.  Objective:   Vitals:   03/29/22 1511  BP: 137/86  Pulse: (!) 59  Resp: 16  Weight: 166 lb (75.3 kg)  Height: 5\' 6"  (1.676 m)   General:  Alert, oriented and cooperative. Patient is in no acute distress.  Skin: Skin is warm and dry. No rash noted.   Cardiovascular: Normal heart rate noted  Respiratory: Normal respiratory effort, no problems with respiration noted  Abdomen: Soft, nontender, nondistended  Pelvic: NEFG. Cervix visually normal, thin blood in vaginal vault. No tenderness.   Assessment and Plan:  Rebekah Smith is a 36 y.o. with abnormal menstrual cycle & recent clamydia infection  1. Chlamydia No e/o PID on exam today - Cervicovaginal ancillary only( [place])  2. Abnormal menstrual cycle Will CTM symptoms, suspect anovulatory cycle Advised pt to RTC if symptoms persist/recur with next cycle  Return if symptoms worsen or fail to improve.  20, MD

## 2022-03-30 LAB — CERVICOVAGINAL ANCILLARY ONLY
Chlamydia: NEGATIVE
Comment: NEGATIVE
Comment: NORMAL
Neisseria Gonorrhea: NEGATIVE

## 2022-05-08 ENCOUNTER — Encounter: Payer: Self-pay | Admitting: Family Medicine

## 2022-07-16 ENCOUNTER — Encounter: Payer: Self-pay | Admitting: Obstetrics and Gynecology

## 2022-08-14 ENCOUNTER — Encounter: Payer: Self-pay | Admitting: *Deleted

## 2022-08-14 DIAGNOSIS — Z3480 Encounter for supervision of other normal pregnancy, unspecified trimester: Secondary | ICD-10-CM | POA: Insufficient documentation

## 2022-08-14 NOTE — Progress Notes (Signed)
Last pap- 07/24/21- negative

## 2022-08-16 ENCOUNTER — Encounter: Payer: Self-pay | Admitting: Obstetrics and Gynecology

## 2022-08-16 ENCOUNTER — Encounter: Payer: Self-pay | Admitting: Certified Nurse Midwife

## 2022-08-17 ENCOUNTER — Ambulatory Visit (INDEPENDENT_AMBULATORY_CARE_PROVIDER_SITE_OTHER): Payer: Medicaid Other

## 2022-08-17 ENCOUNTER — Ambulatory Visit (INDEPENDENT_AMBULATORY_CARE_PROVIDER_SITE_OTHER): Payer: Medicaid Other | Admitting: Certified Nurse Midwife

## 2022-08-17 ENCOUNTER — Encounter: Payer: Self-pay | Admitting: Certified Nurse Midwife

## 2022-08-17 VITALS — BP 129/86 | HR 71 | Wt 154.0 lb

## 2022-08-17 DIAGNOSIS — Z1339 Encounter for screening examination for other mental health and behavioral disorders: Secondary | ICD-10-CM | POA: Diagnosis not present

## 2022-08-17 DIAGNOSIS — Z6790 Unspecified blood type, Rh positive: Secondary | ICD-10-CM | POA: Diagnosis not present

## 2022-08-17 DIAGNOSIS — Z3480 Encounter for supervision of other normal pregnancy, unspecified trimester: Secondary | ICD-10-CM

## 2022-08-17 DIAGNOSIS — Z3A01 Less than 8 weeks gestation of pregnancy: Secondary | ICD-10-CM

## 2022-08-17 DIAGNOSIS — O26851 Spotting complicating pregnancy, first trimester: Secondary | ICD-10-CM

## 2022-08-17 DIAGNOSIS — O26859 Spotting complicating pregnancy, unspecified trimester: Secondary | ICD-10-CM

## 2022-08-17 DIAGNOSIS — O3680X0 Pregnancy with inconclusive fetal viability, not applicable or unspecified: Secondary | ICD-10-CM

## 2022-08-17 DIAGNOSIS — O3680X1 Pregnancy with inconclusive fetal viability, fetus 1: Secondary | ICD-10-CM

## 2022-08-17 NOTE — Progress Notes (Signed)
GYNECOLOGY OFFICE VISIT NOTE  History:  37 y.o. [id number] @[redacted]w[redacted]d  by sure LMP here today for NOB. Denies pain or cramping. Reports occasional spotting when she wipes. Reports irregular cycles.   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  01/10/16 Titer 4  [x]  MFM referral- serial MCA doppler  FOB is negative so no further testing is needed (pregnancy 2023--[name])   Migraines    Pregnancy induced hypertension     Past Surgical History:  Procedure Laterality Date   TONSILLECTOMY     TONSILLECTOMY      Family History  Problem Relation Age of Onset   Anxiety disorder Mother    Hypertension Mother    Heart murmur Mother    Breast cancer Maternal Aunt    Breast cancer Paternal Grandmother    Cervical cancer Maternal Aunt    Stroke Maternal Grandmother     Social History   Socioeconomic History   Marital status: Divorced    Spouse name: Not on file   Number of children: Not on file   Years of education: Not on file   Highest education level: Not on file  Occupational History   Occupation: Actuary    Employer: [employer]  Tobacco Use   Smoking status: Never   Smokeless tobacco: Never  Vaping Use   Vaping Use: Never used  Substance and Sexual Activity   Alcohol use: No   Drug use: No   Sexual activity: Yes    Partners: Male    Birth control/protection: None  Other Topics Concern   Not on file  Social History Narrative   Not on file   Social Determinants of Health   Financial Resource Strain: Not on file  Food Insecurity: Not on file  Transportation Needs: Not on file  Physical Activity: Not on file   Stress: Not on file  Social Connections: Not on file   The following portions of the patient's history were reviewed and updated as appropriate: allergies, current medications, past family history, past medical history, past social history, past surgical history and problem list.   Health Maintenance:      Component Value Date/Time   DIAGPAP  07/24/2021 0937    - Negative for intraepithelial lesion or malignancy (NILM)   DIAGPAP  06/16/2020 1426    - Negative for Intraepithelial Lesions or Malignancy (NILM)   DIAGPAP - Benign reactive/reparative changes 06/16/2020 1426   HPVHIGH Negative 07/24/2021 0937   HPVHIGH Positive (A) 06/16/2020 1426   ADEQPAP  07/24/2021 0937    Satisfactory for evaluation; transformation zone component PRESENT.   ADEQPAP  06/16/2020 1426    Satisfactory for evaluation; transformation zone component PRESENT.   ADEQPAP  08/21/2016 0000    Satisfactory for evaluation  endocervical/transformation zone component PRESENT.   Review of Systems:  Negative except noted in HPI  Objective:  Physical Exam BP 129/86   Pulse 71   Wt 154 lb (69.9 kg)   LMP 06/13/2022   BMI 24.86 kg/m  CONSTITUTIONAL: Well-developed, well-nourished female in no acute distress.  HENT:  Normocephalic, atraumatic EYES: Conjunctivae and EOM are normal NECK:  Normal range of motion NEUROLOGIC: Alert and oriented to person, place, and time PSYCHIATRIC: Normal mood and affect CARDIOVASCULAR: Normal heart rate noted RESPIRATORY: Effort and rate normal MUSCULOSKELETAL: Normal range of motion  Labs and Imaging No results found for this or any previous visit (from the past 24 hour(s)).  TVUS: IUGS measuring 6 weeks, +YS, no FP Assessment & Plan:   1. Supervision of other normal pregnancy, antepartum   2. Spotting in early pregnancy   3. Pregnancy with inconclusive fetal viability, single or unspecified fetus   4. Blood type, Rh positive    Discussed US findings, size/dates  discrepancy, cannot determine early pregnancy vs failed pregnancy Recommend rpt [place] in 7-10 days Follow up for US-ordered SAB precautions  I spent 15 minutes dedicated to the care of this patient including pre-visit review of records, face to face time with the patient discussing her conditions and treatments and post visit ordering of testing.  Donette Larry, CNM 08/17/2022 9:10 AM

## 2022-08-22 ENCOUNTER — Encounter: Payer: Medicaid Other | Admitting: Obstetrics and Gynecology

## 2022-08-24 ENCOUNTER — Ambulatory Visit (INDEPENDENT_AMBULATORY_CARE_PROVIDER_SITE_OTHER): Payer: Medicaid Other

## 2022-08-24 DIAGNOSIS — O26859 Spotting complicating pregnancy, unspecified trimester: Secondary | ICD-10-CM | POA: Diagnosis not present

## 2022-08-24 DIAGNOSIS — Z3A10 10 weeks gestation of pregnancy: Secondary | ICD-10-CM

## 2022-08-27 ENCOUNTER — Encounter (HOSPITAL_COMMUNITY): Payer: Self-pay | Admitting: Obstetrics & Gynecology

## 2022-08-27 ENCOUNTER — Inpatient Hospital Stay (HOSPITAL_COMMUNITY): Payer: Medicaid Other

## 2022-08-27 ENCOUNTER — Inpatient Hospital Stay (HOSPITAL_COMMUNITY)
Admission: AD | Admit: 2022-08-27 | Discharge: 2022-08-27 | Disposition: A | Discharge status: Home / Self Care | Payer: Medicaid Other | Attending: Obstetrics & Gynecology | Admitting: Obstetrics & Gynecology

## 2022-08-27 DIAGNOSIS — Z6790 Unspecified blood type, Rh positive: Secondary | ICD-10-CM | POA: Diagnosis not present

## 2022-08-27 DIAGNOSIS — Z3A10 10 weeks gestation of pregnancy: Secondary | ICD-10-CM | POA: Diagnosis not present

## 2022-08-27 DIAGNOSIS — O039 Complete or unspecified spontaneous abortion without complication: Secondary | ICD-10-CM | POA: Diagnosis not present

## 2022-08-27 LAB — HCG, QUANTITATIVE, PREGNANCY: hCG, Beta Chain, Quant, S: 19745 m[IU]/mL — ABNORMAL HIGH (ref <5)

## 2022-08-27 LAB — CBC
HCT: 38.8 % (ref 36.0–46.0)
Hemoglobin: 13.4 g/dL (ref 12.0–15.0)
MCH: 29.9 pg (ref 26.0–34.0)
MCHC: 34.5 g/dL (ref 30.0–36.0)
MCV: 86.6 fL (ref 80.0–100.0)
Platelets: 221 10*3/uL (ref 150–400)
RBC: 4.48 MIL/uL (ref 3.87–5.11)
RDW: 12 % (ref 11.5–15.5)
WBC: 8.3 10*3/uL (ref 4.0–10.5)
nRBC: 0 % (ref 0.0–0.2)

## 2022-08-27 MED ORDER — KETOROLAC TROMETHAMINE 30 MG/ML IJ SOLN: 30.0000 mg | Freq: Once | INTRAMUSCULAR | Status: DC

## 2022-08-27 MED ORDER — MORPHINE SULFATE (PF) 4 MG/ML IV SOLN
4.0000 mg | Freq: Once | INTRAVENOUS | Status: AC
  Administered 2022-08-27: 4 mg via INTRAMUSCULAR
  Filled 2022-08-27: qty 1

## 2022-08-27 MED ORDER — IBUPROFEN 600 MG PO TABS: 600.0000 mg | ORAL_TABLET | Freq: Four times a day (QID) | ORAL | 0 refills | Status: DC | PRN

## 2022-08-27 NOTE — MAU Note (Addendum)
.  Rebekah Smith is a 37 y.o. at [redacted]w[redacted]d here in MAU reporting: spotting for x1 week, but then this morning around 1130 she started bleeding very heavy and passing large clots. She thinks she passed the products of conception. Every time she uses the restroom she passes two or three golf ball sized clots. Soaks through a pad about every 1.5 hrs. Also reports dizziness and lower abdominal cramping. Took 600mg  of ibuprofen around 1130.  Pain score: 7 Vitals:   08/27/22 1325  BP: 128/88  Pulse: 66  Resp: 14  Temp: 97.7 F (36.5 C)  SpO2: 100%      Lab orders placed from triage:  UA

## 2022-08-27 NOTE — MAU Provider Note (Signed)
History     CSN: [id number]  Arrival date and time: 08/27/22 1314   Event Date/Time   First Provider Initiated Contact with Patient 08/27/22 1338      Chief Complaint  Patient presents with   Vaginal Bleeding   Dizziness   37 y.o. [id number] @ [redacted]w[redacted]d by LMP presenting with VB and passing tissue. Reports onset of sx this morning. States she is passing large clots and has used 3-4 pads today. She took a picture of what she passed and appears to be a fluid filled sack with grey tissue surrounding. Endorses constant abdominal cramping and rates pain 7/10.     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  01/10/16 Titer 4  [x]  MFM referral- serial MCA doppler  FOB is negative so no further testing is needed (pregnancy 2023--[name])   Migraines    Pregnancy induced hypertension     Past Surgical History:  Procedure Laterality Date   TONSILLECTOMY     TONSILLECTOMY      Family History  Problem Relation Age of Onset   Anxiety disorder Mother    Hypertension Mother    Heart murmur Mother    Breast cancer Maternal Aunt    Breast cancer Paternal Grandmother    Cervical cancer Maternal Aunt    Stroke Maternal Grandmother     Social History   Tobacco Use   Smoking status: Never   Smokeless tobacco: Never  Vaping Use   Vaping Use: Never used  Substance Use Topics   Alcohol use: No   Drug use: No    Allergies: No Known Allergies  No medications  prior to admission.    Review of Systems  Gastrointestinal:  Positive for abdominal pain.  Genitourinary:  Positive for vaginal bleeding.   Physical Exam   Blood pressure 124/82, pulse (!) 59, temperature 97.7 F (36.5 C), resp. rate 14, height 5\' 7"  (1.702 m), last menstrual period 06/13/2022, SpO2 100 %.  Physical Exam Vitals and nursing note reviewed. Exam conducted with a chaperone present.  Constitutional:      General: She is not in acute distress.    Appearance: Normal appearance.  HENT:     Head: Normocephalic and atraumatic.  Cardiovascular:     Rate and Rhythm: Normal rate.  Pulmonary:     Effort: Pulmonary effort is normal. No respiratory distress.  Abdominal:     Palpations: Abdomen is soft.     Tenderness: There is no abdominal tenderness.  Musculoskeletal:        General: Normal range of motion.     Cervical back: Normal range of motion.  Skin:    General: Skin is warm and dry.  Neurological:     General: No focal deficit present.     Mental Status: She is alert and oriented to person, place, and time.  Psychiatric:        Mood and Affect: Mood normal.        Behavior: Behavior normal.    Results for orders placed or performed during the hospital encounter of 08/27/22 (from the past 24 hour(s))  CBC     Status: None   Collection Time: 08/27/22  2:08 PM  Result Value Ref Range   WBC 8.3 4.0 - 10.5 K/uL   RBC 4.48 3.87 - 5.11 MIL/uL   Hemoglobin 13.4 12.0 - 15.0 g/dL   HCT 40.9 81.1 - 91.4 %   MCV 86.6 80.0 - 100.0 fL   MCH 29.9 26.0 - 34.0 pg   MCHC 34.5 30.0 - 36.0 g/dL   RDW 78.2 95.6 - 21.3 %   Platelets 221 150 - 400 K/uL   nRBC 0.0 0.0 - 0.2 %  hCG, quantitative, pregnancy     Status: Abnormal   Collection Time: 08/27/22  2:08 PM  Result Value Ref Range   hCG, Beta Chain, Quant, S 19,745 (H) <5 mIU/mL   US OB Transvaginal  Result Date: 08/27/2022 CLINICAL DATA:  Vaginal bleeding. Estimated gestational age of [redacted] weeks, 5 days by LMP. EXAM:  TRANSVAGINAL OB ULTRASOUND TECHNIQUE: Transvaginal ultrasound was performed for complete evaluation of the gestation as well as the maternal uterus, adnexal regions, and pelvic cul-de-sac. COMPARISON:  OB ultrasound dated Aug 24, 2022. FINDINGS: Intrauterine gestational sac: None. Maternal uterus/adnexae: Heterogeneous, thickened endometrium with mildly increased vascularity. The ovaries are unremarkable. No free fluid in the pelvis. IMPRESSION: 1. Failed pregnancy with expected appearance of the endometrium during ongoing abortion. Electronically Signed   By: Obie Dredge M.D.   On: 08/27/2022 15:58    MAU Course  Procedures Morphine  MDM IUP previously seen on [place] 3 days ago. [place] today confirms SAB. Pain improved with meds. Bleeding minimal. Hgb stable. Discussed findings with pt and partner, condolences given. Stable for discharge home.  Assessment and Plan   1. Supervision of other normal pregnancy, antepartum   2. SAB (spontaneous abortion)   3. Blood type, Rh positive    Discharge home Rx Ibuprofen Bleeding/return precautions Pelvic rest [facility] up at [facility] in 2 weeks- message sent  Allergies as of 08/27/2022   No Known Allergies      Medication List     STOP taking these medications    metoprolol succinate 25 MG 24 hr tablet Commonly known as: TOPROL-XL       TAKE these medications    citalopram 20 MG tablet Commonly known as: CELEXA 1/2 tab at bedtime x 4 days, then 1 tab at bedtime   ibuprofen 600 MG tablet Commonly known as: ADVIL Take 1 tablet (600 mg total) by mouth every 6 (six) hours as needed for mild pain, moderate pain or cramping. What changed: reasons to take this   multivitamin-prenatal 27-0.8 MG Tabs tablet Take 1 tablet by mouth daily at 12 noon.       Donette Larry, CNM 08/27/2022, 4:47 PM

## 2022-08-28 ENCOUNTER — Telehealth: Payer: Medicaid Other

## 2022-08-29 ENCOUNTER — Encounter: Payer: Self-pay | Admitting: Certified Nurse Midwife

## 2022-09-11 ENCOUNTER — Ambulatory Visit: Payer: Medicaid Other | Admitting: Certified Nurse Midwife

## 2022-10-02 ENCOUNTER — Encounter: Payer: Self-pay | Admitting: Certified Nurse Midwife

## 2022-10-02 ENCOUNTER — Encounter: Payer: Self-pay | Admitting: Obstetrics and Gynecology

## 2022-10-04 ENCOUNTER — Encounter: Payer: Self-pay | Admitting: Obstetrics and Gynecology

## 2022-10-04 NOTE — Progress Notes (Deleted)
   GYNECOLOGY OFFICE VISIT NOTE  History:   Rebekah Smith is a 37 y.o. [phone] here today for SAB follow up. She had a miscarriage at 10w in May. She has had intermittent bleeding since her miscarriage on 5/27. She had an [place] at that time which was c/w recent SAB but no imaging since.   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  01/10/16 Titer 4  [x]  MFM referral- serial MCA doppler  FOB is negative so no further testing is needed (pregnancy 2023--[name])   Migraines    Pregnancy induced hypertension     Past Surgical History:  Procedure Laterality Date   TONSILLECTOMY     TONSILLECTOMY      The following portions of the patient's history were reviewed and updated as appropriate: allergies, current medications, past family history, past medical history, past social history, past surgical history and problem list.   Health Maintenance:   Normal pap and negative HRHPV on ***.   Diagnosis  Date Value Ref Range Status  07/24/2021   Final   - Negative for intraepithelial lesion or malignancy (NILM)     Normal mammogram on ***.   Review of Systems:  Pertinent items noted in HPI and remainder of comprehensive ROS otherwise negative.  Physical Exam:  Breastfeeding Unknown  CONSTITUTIONAL: Well-developed, well-nourished female in no acute distress.  HEENT:  Normocephalic, atraumatic. External right and left ear normal. No scleral icterus.  NECK: Normal range of motion, supple, no masses noted on observation SKIN: No rash noted. Not diaphoretic. No erythema. No pallor. MUSCULOSKELETAL: Normal range of motion. No edema  noted. NEUROLOGIC: Alert and oriented to person, place, and time. Normal muscle tone coordination. No cranial nerve deficit noted. PSYCHIATRIC: Normal mood and affect. Normal behavior. Normal judgment and thought content.  PELVIC: {Blank single:19197::"Deferred","Normal appearing external genitalia; normal urethral meatus; normal appearing vaginal mucosa and cervix.  No abnormal discharge noted.  Normal uterine size, no other palpable masses, no uterine or adnexal tenderness. Performed in the presence of a chaperone"}  Labs and Imaging No results found for this or any previous visit (from the past 168 hour(s)). No results found.  Assessment and Plan:   1. SAB (spontaneous abortion) For bleeding that doesn't seem associated with her period, would recommend TVUS to ensure no ongoing RPOC. Discussed could also be menses related.  Discussed plan for future pregnancies: ***    Diagnoses and all orders for this visit:  SAB (spontaneous abortion)    Routine preventative health maintenance measures emphasized. Please refer to After Visit Summary for other counseling recommendations.   No follow-ups on file.  Milas Hock, MD, FACOG Obstetrician & Gynecologist, [facility] for [facility], [facility]

## 2022-10-10 ENCOUNTER — Ambulatory Visit: Payer: Medicaid Other | Admitting: Obstetrics and Gynecology

## 2022-10-10 DIAGNOSIS — O039 Complete or unspecified spontaneous abortion without complication: Secondary | ICD-10-CM

## 2022-10-15 ENCOUNTER — Ambulatory Visit: Payer: Medicaid Other | Admitting: Obstetrics & Gynecology

## 2022-10-15 ENCOUNTER — Encounter: Payer: Self-pay | Admitting: Obstetrics & Gynecology

## 2022-10-15 VITALS — BP 137/93 | HR 60 | Ht 67.0 in | Wt 144.0 lb

## 2022-10-15 DIAGNOSIS — O039 Complete or unspecified spontaneous abortion without complication: Secondary | ICD-10-CM | POA: Diagnosis not present

## 2022-10-15 NOTE — Progress Notes (Unsigned)
   Subjective:    Patient ID: Rebekah Smith, female    DOB: [birth year], 37 y.o.   MRN: [id number]  HPI  37 year old female presents for persistent vaginal bleeding after SAB in late May.  Bleeding has been most days.  Sometimes it is heavy with clots sometimes it is light and only when she wipes.  Patient also complaining of unintentional weight loss.  Review of Systems  Constitutional:  Positive for unexpected weight change.  Respiratory: Negative.    Cardiovascular: Negative.   Gastrointestinal: Negative.   Genitourinary:  Positive for vaginal bleeding.  Psychiatric/Behavioral: Negative.         Objective:   Physical Exam Vitals reviewed.  Constitutional:      General: She is not in acute distress.    Appearance: She is well-developed.  HENT:     Head: Normocephalic and atraumatic.  Eyes:     Conjunctiva/sclera: Conjunctivae normal.  Cardiovascular:     Rate and Rhythm: Normal rate.  Pulmonary:     Effort: Pulmonary effort is normal.  Abdominal:     General: Abdomen is flat. There is no distension.     Palpations: Abdomen is soft.     Tenderness: There is no abdominal tenderness. There is no guarding.  Genitourinary:    Comments: Tanner V Vulva:  No lesion Vagina:  Pink, no lesions, no discharge, no blood Cervix:  No CMT Uterus:  Non tender, mobile, retroverted Right adnexa--non tender, no mass Left adnexa--non tender, no mass   Skin:    General: Skin is warm and dry.  Neurological:     Mental Status: She is alert and oriented to person, place, and time.  Psychiatric:        Mood and Affect: Mood normal.    Vitals:   10/15/22 1536  BP: (!) 137/93  Pulse: 60  Weight: 144 lb (65.3 kg)  Height: 5\' 7"  (1.702 m)      Assessment & Plan:  37 year old female with persistent vaginal bleeding after SAB in May 2023  CBC to assess for anemia Beta-hCG, as this could be a second pregnancy or retained POC TSH for unexplained weight loss Pelvic ultrasound  complete  25 minutes was spent during this encounter including review of records, personal review of images of the ultrasound, labs, history and physical, coordination of care, documentation.

## 2022-10-17 LAB — CBC
Hematocrit: 37.3 % (ref 34.0–46.6)
Hemoglobin: 12.8 g/dL (ref 11.1–15.9)
MCH: 30.2 pg (ref 26.6–33.0)
MCHC: 34.3 g/dL (ref 31.5–35.7)
MCV: 88 fL (ref 79–97)
Platelets: 308 10*3/uL (ref 150–450)
RBC: 4.24 x10E6/uL (ref 3.77–5.28)
RDW: 11.7 % (ref 11.7–15.4)
WBC: 5.8 10*3/uL (ref 3.4–10.8)

## 2022-10-17 LAB — BETA HCG QUANT (REF LAB): hCG Quant: <1 m[IU]/mL

## 2022-10-17 LAB — TSH: TSH: 0.784 u[IU]/mL (ref 0.450–4.500)

## 2022-10-23 ENCOUNTER — Other Ambulatory Visit: Payer: Medicaid Other

## 2022-10-26 ENCOUNTER — Ambulatory Visit: Payer: Medicaid Other

## 2022-10-26 DIAGNOSIS — O039 Complete or unspecified spontaneous abortion without complication: Secondary | ICD-10-CM | POA: Diagnosis not present

## 2022-10-26 DIAGNOSIS — Z3A00 Weeks of gestation of pregnancy not specified: Secondary | ICD-10-CM

## 2023-01-15 ENCOUNTER — Encounter: Payer: Self-pay | Admitting: Obstetrics & Gynecology

## 2023-01-22 ENCOUNTER — Other Ambulatory Visit: Payer: Self-pay

## 2023-01-22 DIAGNOSIS — O3680X0 Pregnancy with inconclusive fetal viability, not applicable or unspecified: Secondary | ICD-10-CM

## 2023-01-22 NOTE — Progress Notes (Signed)
U/S ordered per Dr.Leggett

## 2023-02-04 ENCOUNTER — Ambulatory Visit: Payer: Medicaid Other

## 2023-02-04 DIAGNOSIS — O3680X0 Pregnancy with inconclusive fetal viability, not applicable or unspecified: Secondary | ICD-10-CM

## 2023-02-04 DIAGNOSIS — Z3A01 Less than 8 weeks gestation of pregnancy: Secondary | ICD-10-CM

## 2023-02-06 ENCOUNTER — Other Ambulatory Visit: Payer: Medicaid Other

## 2023-02-11 ENCOUNTER — Encounter: Payer: Self-pay | Admitting: Obstetrics & Gynecology

## 2023-02-21 ENCOUNTER — Encounter: Payer: Self-pay | Admitting: Obstetrics and Gynecology

## 2023-02-21 ENCOUNTER — Other Ambulatory Visit: Payer: Medicaid Other

## 2023-02-21 ENCOUNTER — Other Ambulatory Visit (HOSPITAL_COMMUNITY)
Admission: RE | Admit: 2023-02-21 | Discharge: 2023-02-21 | Disposition: A | Discharge status: Home / Self Care | Payer: Medicaid Other | Source: Ambulatory Visit | Attending: Obstetrics and Gynecology | Admitting: Obstetrics and Gynecology

## 2023-02-21 ENCOUNTER — Ambulatory Visit: Payer: Medicaid Other | Admitting: Obstetrics and Gynecology

## 2023-02-21 VITALS — BP 133/82 | HR 77 | Wt 152.0 lb

## 2023-02-21 DIAGNOSIS — Z3A08 8 weeks gestation of pregnancy: Secondary | ICD-10-CM

## 2023-02-21 DIAGNOSIS — Z1339 Encounter for screening examination for other mental health and behavioral disorders: Secondary | ICD-10-CM

## 2023-02-21 DIAGNOSIS — O09299 Supervision of pregnancy with other poor reproductive or obstetric history, unspecified trimester: Secondary | ICD-10-CM

## 2023-02-21 DIAGNOSIS — R002 Palpitations: Secondary | ICD-10-CM | POA: Diagnosis not present

## 2023-02-21 DIAGNOSIS — Z8759 Personal history of other complications of pregnancy, childbirth and the puerperium: Secondary | ICD-10-CM

## 2023-02-21 DIAGNOSIS — Z3480 Encounter for supervision of other normal pregnancy, unspecified trimester: Secondary | ICD-10-CM | POA: Insufficient documentation

## 2023-02-21 DIAGNOSIS — O361910 Maternal care for other isoimmunization, first trimester, not applicable or unspecified: Secondary | ICD-10-CM

## 2023-02-21 DIAGNOSIS — O09291 Supervision of pregnancy with other poor reproductive or obstetric history, first trimester: Secondary | ICD-10-CM | POA: Diagnosis not present

## 2023-02-21 DIAGNOSIS — Z3481 Encounter for supervision of other normal pregnancy, first trimester: Secondary | ICD-10-CM | POA: Insufficient documentation

## 2023-02-21 DIAGNOSIS — R87610 Atypical squamous cells of undetermined significance on cytologic smear of cervix (ASC-US): Secondary | ICD-10-CM

## 2023-02-21 DIAGNOSIS — R87810 Cervical high risk human papillomavirus (HPV) DNA test positive: Secondary | ICD-10-CM

## 2023-02-22 ENCOUNTER — Encounter: Payer: Self-pay | Admitting: Obstetrics and Gynecology

## 2023-02-22 LAB — COMPREHENSIVE METABOLIC PANEL
ALT: 9 [IU]/L (ref 0–32)
AST: 15 [IU]/L (ref 0–40)
Albumin: 4.5 g/dL (ref 3.9–4.9)
Alkaline Phosphatase: 46 [IU]/L (ref 44–121)
BUN/Creatinine Ratio: 25 — ABNORMAL HIGH (ref 9–23)
BUN: 15 mg/dL (ref 6–20)
Bilirubin Total: 0.3 mg/dL (ref 0.0–1.2)
CO2: 23 mmol/L (ref 20–29)
Calcium: 9.6 mg/dL (ref 8.7–10.2)
Chloride: 100 mmol/L (ref 96–106)
Creatinine, Ser: 0.61 mg/dL (ref 0.57–1.00)
Globulin, Total: 1.9 g/dL (ref 1.5–4.5)
Glucose: 92 mg/dL (ref 70–99)
Potassium: 4.2 mmol/L (ref 3.5–5.2)
Sodium: 137 mmol/L (ref 134–144)
Total Protein: 6.4 g/dL (ref 6.0–8.5)
eGFR: 120 mL/min/{1.73_m2} (ref >59)

## 2023-02-22 LAB — PROTEIN / CREATININE RATIO, URINE
Creatinine, Urine: 95.2 mg/dL
Protein, Ur: 7.6 mg/dL
Protein/Creat Ratio: 80 mg/g{creat} (ref 0–200)

## 2023-02-24 ENCOUNTER — Encounter: Payer: Self-pay | Admitting: Obstetrics and Gynecology

## 2023-02-24 NOTE — Progress Notes (Signed)
Subjective:   Rebekah Smith is a 37 y.o. [id number] at [redacted]w[redacted]d by early ultrasound being seen today for her first obstetrical visit.  Her obstetrical history is significant for  kell alloimmunization, history of postpartum hemorrhage requiring blood transfusion, history of severe preeclampsia requiring iatrogenic preterm birth . Patient does intend to breast feed. Pregnancy history fully reviewed.  Patient reports  nausea, increased palpitations worse at night .  HISTORY: OB History  Gravida Para Term Preterm AB Living  8 4 2 2 2 4   SAB IAB Ectopic Multiple Live Births  2 0 0 0 4    # Outcome Date GA Lbr Len/2nd Weight Sex Type Anes PTL Lv  8 Current           7 Preterm 07/14/16 [redacted]w[redacted]d 21:04 / 00:15 7 lb 4.4 oz (3.3 kg) M Vag-Spont EPI  LIV     Birth Comments: PPH     Name: Rebekah Smith     Apgar1: 9  Apgar5: 9  6 Preterm 04/30/14 [redacted]w[redacted]d / 00:14 6 lb 2.1 oz (2.781 kg) M Vag-Spont EPI  LIV     Birth Comments: within normal limits     Apgar1: 9  Apgar5: 9  5 SAB 06/09/13          4 Term 09/01/10 [redacted]w[redacted]d  9 lb 9 oz (4.338 kg) M Vag-Spont EPI N LIV     Birth Comments: PPH  3 Term 12/03/07 [redacted]w[redacted]d  8 lb 9 oz (3.884 kg) M Vag-Spont EPI N LIV  2 Gravida           1 SAB              Last pap smear: Lab Results  Component Value Date   DIAGPAP  07/24/2021    - Negative for intraepithelial lesion or malignancy (NILM)   HPV NOT DETECTED 08/21/2016   HPVHIGH Negative 07/24/2021   Past Medical History:  Diagnosis Date   Abnormal Pap smear of cervix    colposcopy   Anemia    Blood transfusion    Chlamydia infection 02/28/2022   Hemorrhage    History of preeclampsia    Had in two last pregnancies, denies chronic hypertension as she is normotensive when not pregnant   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  01/10/16 Titer 4  [x]  MFM referral- serial MCA doppler  FOB is negative so no further testing is needed  (pregnancy 2023--[name])   Migraines    Pregnancy induced hypertension    Past Surgical History:  Procedure Laterality Date   TONSILLECTOMY     Family History  Problem Relation Age of Onset   Anxiety disorder Mother    Hypertension Mother    Heart murmur Mother    Breast cancer Maternal Aunt    Breast cancer Paternal Grandmother    Cervical cancer Maternal Aunt    Stroke Maternal Grandmother    Social History   Tobacco Use   Smoking status: Never   Smokeless tobacco: Never  Vaping Use   Vaping status: Never Used  Substance Use Topics   Alcohol use: No   Drug use: No   No Known Allergies Current Outpatient Medications on File Prior to Visit  Medication Sig Dispense Refill   metoprolol succinate (TOPROL-XL) 25 MG 24 hr tablet Take by mouth.     No current facility-administered medications on file prior to visit.   Exam   Vitals:   02/21/23 1512  BP: 133/82  Pulse: 77  Weight: 152 lb (68.9 kg)   Fetal Heart Rate (bpm): 171  General:  Alert, oriented and cooperative. Patient is in no acute distress.  Breast: Deferred  Cardiovascular: Normal heart rate noted  Respiratory: Normal respiratory effort, no problems with respiration noted  Abdomen: Soft, non tender  Pain/Pressure: Absent     Extremities: Normal range of motion.  Edema: None  Mental Status: Normal mood and affect. Normal behavior. Normal judgment and thought content.    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 Supervision of other normal pregnancy, antepartum 2. Encounter for supervision of other normal  pregnancy in first trimester 3. [redacted] weeks gestation of pregnancy Initial labs drawn. Continue prenatal vitamins. Genetic Screening discussed: NIPS, carrier screening and AFP, ordered. Ultrasound discussed; fetal anatomic survey: ordered. Problem list reviewed and updated. The nature of [facility] with multiple MDs and other Advanced Practice Providers was explained to patient; also emphasized that residents, students are part of our team. Routine obstetric precautions reviewed. - [place] MFM OB COMP + 14 WK; Future - Pregnancy, Initial Screen - HgB A1c - US OB Limited; Future - Protein / creatinine ratio, urine - Comprehensive metabolic panel - PANORAMA PRENATAL TEST - Cytology - PAP( [place])  4. History of postpartum hemorrhage Required transfusion in 2012 pregnancy. 2016 pregnancy EBL 500cc, 2018 EBL 700cc. Optimize Hgb, consider PIVx2 and T&C x 2u on admission to L&D if anemic  5. History of pre-eclampsia in prior pregnancy, currently pregnant Discussed elevated risk of preeclampsia this pregnancy Baseline labs & PCR ordered Normotensive today Discussed ldASA to start at 12 weeks  6. Kell isoimmunization during pregnancy in first trimester, single or unspecified fetus Antigen testing ordered for FOB Joshua  7. ASCUS with positive high risk HPV cervical - Cytology - PAP( [place])  8. Palpitations Continue metoprolol  Has cardiology appointment scheduled 11/27  Return in about 4 weeks (around 03/21/2023) for return OB at 12 weeks.  Harvie Bridge, MD Obstetrician & Gynecologist, [facility] for [employer], [facility]

## 2023-02-27 ENCOUNTER — Encounter: Payer: Self-pay | Admitting: Obstetrics and Gynecology

## 2023-03-04 LAB — PREGNANCY, INITIAL SCREEN
Basophils Absolute: 0 10*3/uL (ref 0.0–0.2)
Basos: 0 %
Bilirubin, UA: NEGATIVE
Chlamydia trachomatis, NAA: NEGATIVE
EOS (ABSOLUTE): 0.1 10*3/uL (ref 0.0–0.4)
Eos: 1 %
Glucose, UA: NEGATIVE
HCV Ab: NONREACTIVE
HIV Screen 4th Generation wRfx: NONREACTIVE
Hematocrit: 37.9 % (ref 34.0–46.6)
Hemoglobin: 13.1 g/dL (ref 11.1–15.9)
Hepatitis B Surface Ag: NEGATIVE
Immature Grans (Abs): 0 10*3/uL (ref 0.0–0.1)
Immature Granulocytes: 0 %
Ketones, UA: NEGATIVE
Leukocytes,UA: NEGATIVE
Lymphocytes Absolute: 1.2 10*3/uL (ref 0.7–3.1)
Lymphs: 18 %
MCH: 29.9 pg (ref 26.6–33.0)
MCHC: 34.6 g/dL (ref 31.5–35.7)
MCV: 87 fL (ref 79–97)
Monocytes Absolute: 0.6 10*3/uL (ref 0.1–0.9)
Monocytes: 8 %
Neisseria Gonorrhoeae by PCR: NEGATIVE
Neutrophils Absolute: 5 10*3/uL (ref 1.4–7.0)
Neutrophils: 73 %
Nitrite, UA: NEGATIVE
Platelets: 227 10*3/uL (ref 150–450)
Protein,UA: NEGATIVE
RBC, UA: NEGATIVE
RBC: 4.38 x10E6/uL (ref 3.77–5.28)
RDW: 12.2 % (ref 11.7–15.4)
RPR Ser Ql: NONREACTIVE
Rh Factor: POSITIVE
Rubella Antibodies, IGG: 1.31 {index} (ref >0.99)
Specific Gravity, UA: 1.021 (ref 1.005–1.030)
Urobilinogen, Ur: 0.2 mg/dL (ref 0.2–1.0)
WBC: 6.9 10*3/uL (ref 3.4–10.8)
pH, UA: 6 (ref 5.0–7.5)

## 2023-03-04 LAB — MICROSCOPIC EXAMINATION: Casts: NONE SEEN /[LPF]

## 2023-03-04 LAB — AB SCR+ANTIBODY ID: Antibody Screen: POSITIVE — AB

## 2023-03-04 LAB — HEMOGLOBIN A1C
Est. average glucose Bld gHb Est-mCnc: 97 mg/dL
Hgb A1c MFr Bld: 5 % (ref 4.8–5.6)

## 2023-03-04 LAB — CYTOLOGY - PAP
Comment: NEGATIVE
Diagnosis: NEGATIVE
High risk HPV: NEGATIVE

## 2023-03-04 LAB — HCV INTERPRETATION

## 2023-03-04 LAB — URINE CULTURE, OB REFLEX

## 2023-03-06 ENCOUNTER — Encounter: Payer: Self-pay | Admitting: Obstetrics and Gynecology

## 2023-03-11 ENCOUNTER — Encounter: Payer: Self-pay | Admitting: Obstetrics and Gynecology

## 2023-03-14 ENCOUNTER — Telehealth: Payer: Self-pay | Admitting: *Deleted

## 2023-03-14 DIAGNOSIS — O285 Abnormal chromosomal and genetic finding on antenatal screening of mother: Secondary | ICD-10-CM

## 2023-03-14 LAB — PANORAMA PRENATAL TEST FULL PANEL:PANORAMA TEST PLUS 5 ADDITIONAL MICRODELETIONS
FETAL FRACTION: 3.9
REPORT SUMMARY: HIGH — AB
TRIPLOIDY 13 18 RESULT TEXT: HIGH — AB

## 2023-03-14 NOTE — Telephone Encounter (Cosign Needed)
Received Natera results which are HR due to fetal DNA.  Pt notified and explained taht sometimes these results can have false positives.  She is being referred to MFM for consults and additional testing may be offered.  Pt voices understanding and will wait for her call to schedule the appt.

## 2023-03-18 ENCOUNTER — Encounter: Payer: Self-pay | Admitting: Obstetrics and Gynecology

## 2023-03-18 ENCOUNTER — Ambulatory Visit: Payer: Self-pay | Admitting: Maternal & Fetal Medicine

## 2023-03-18 ENCOUNTER — Ambulatory Visit: Payer: Medicaid Other | Attending: Maternal & Fetal Medicine | Admitting: Obstetrics and Gynecology

## 2023-03-18 ENCOUNTER — Ambulatory Visit (INDEPENDENT_AMBULATORY_CARE_PROVIDER_SITE_OTHER): Payer: Medicaid Other | Admitting: Obstetrics & Gynecology

## 2023-03-18 VITALS — BP 126/88 | HR 80 | Wt 153.0 lb

## 2023-03-18 DIAGNOSIS — O09521 Supervision of elderly multigravida, first trimester: Secondary | ICD-10-CM | POA: Diagnosis not present

## 2023-03-18 DIAGNOSIS — Z3A15 15 weeks gestation of pregnancy: Secondary | ICD-10-CM

## 2023-03-18 DIAGNOSIS — R87610 Atypical squamous cells of undetermined significance on cytologic smear of cervix (ASC-US): Secondary | ICD-10-CM

## 2023-03-18 DIAGNOSIS — R87810 Cervical high risk human papillomavirus (HPV) DNA test positive: Secondary | ICD-10-CM

## 2023-03-18 DIAGNOSIS — O285 Abnormal chromosomal and genetic finding on antenatal screening of mother: Secondary | ICD-10-CM

## 2023-03-18 DIAGNOSIS — O09299 Supervision of pregnancy with other poor reproductive or obstetric history, unspecified trimester: Secondary | ICD-10-CM

## 2023-03-18 DIAGNOSIS — Z3A12 12 weeks gestation of pregnancy: Secondary | ICD-10-CM

## 2023-03-18 DIAGNOSIS — Z3480 Encounter for supervision of other normal pregnancy, unspecified trimester: Secondary | ICD-10-CM

## 2023-03-18 NOTE — Progress Notes (Signed)
   PRENATAL VISIT NOTE  Subjective:  Rebekah Smith is a 37 y.o. [phone] at [redacted]w[redacted]d being seen today for ongoing prenatal care.  She is currently monitored for the following issues for this high-risk pregnancy and has History of postpartum hemorrhage; History of pre-eclampsia in prior pregnancy, currently pregnant; Kell isoimmunization during pregnancy; ASCUS with positive high risk HPV cervical; Family history of breast cancer; and Supervision of other normal pregnancy, antepartum on their problem list.  Patient reports bleeding, headache, and cramping .  Contractions: Not present. Vag. Bleeding: Small.  Movement: Absent. Denies leaking of fluid.   The following portions of the patient's history were reviewed and updated as appropriate: allergies, current medications, past family history, past medical history, past social history, past surgical history and problem list.   Objective:   Vitals:   03/18/23 0926  BP: (!) 136/93  Pulse: 84  Weight: 153 lb (69.4 kg)    Fetal Status: Fetal Heart Rate (bpm): 152   Movement: Absent     General:  Alert, oriented and cooperative. Patient is in no acute distress.  Skin: Skin is warm and dry. No rash noted.   Cardiovascular: Normal heart rate noted  Respiratory: Normal respiratory effort, no problems with respiration noted  Abdomen: Soft, gravid, appropriate for gestational age.  Pain/Pressure: Absent     Pelvic: Cervical exam deferred        Extremities: Normal range of motion.  Edema: None  Mental Status: Normal mood and affect. Normal behavior. Normal judgment and thought content.   Assessment and Plan:  Pregnancy: [id number] at [redacted]w[redacted]d 1. ASCUS with positive high risk HPV cervical (Primary) Rpt 02/2023 is normal with neg HPV  2. History of pre-eclampsia in prior pregnancy, currently pregnant Rpt BP today--(pt very nervous)  120/88 Has not taken metoprolol--advised to tak in the morning. Start ASA at 13 weeks  3. Supervision of other normal  pregnancy, antepartum Rpt [place] for subchorionic hematoma (scanned an [facility] on 02/08/23) RPt [place] at MFM at 14 weeks  4.  Abnormal genetic screen Has genetic video visit--High Risk due to fetal DNA fraction.   Preterm labor symptoms and general obstetric precautions including but not limited to vaginal bleeding, contractions, leaking of fluid and fetal movement were reviewed in detail with the patient. Please refer to After Visit Summary for other counseling recommendations.     Future Appointments  Date Time Provider Department Center  03/18/2023  3:00 PM [facility] GENETIC RM [facility] None  03/20/2023  4:10 PM Lennart Pall, MD [facility]  05/06/2023  2:15 PM [facility] NURSE [facility]  05/06/2023  2:30 PM [facility] US4 [facility]    Elsie Lincoln, MD

## 2023-03-18 NOTE — Addendum Note (Signed)
Addended by: Granville Lewis on: 03/18/2023 10:09 AM   Modules accepted: Orders

## 2023-03-18 NOTE — Progress Notes (Signed)
Virtual Visit via Video Note  I connected with Rebekah Smith on 03/18/23 at  3:00 PM EST by a video enabled telemedicine application and verified that I am speaking with the correct person using two identifiers.  Location: Patient: home Provider: [facility] [place]   Referring provider: Dr. Berton Lan, [facility] Length of Consultation:  40 minutes  Rebekah Smith was referred for genetic counseling at [facility] to review the results of her Panorama noninvasive prenatal screening (NIPS) through [facility] which showed a high risk due to insufficient fetal DNA fraction. She was present on this virtual visit with her partner, Rebekah Smith.      Low Fetal Fraction on Cell-Free DNA Screen (cfDNA): We reviewed that cfDNA screening analyzes cell-free DNA originating from the placenta that is found in the maternal blood circulation during pregnancy. The term "fetal fraction" refers to the amount of DNA in the sample that is believed to have come from placental DNA rather than maternal DNA. This test can provide information regarding the presence or absence of extra placental DNA for chromosomes 13, 18, and 21 as well as the sex chromosomes. Rebekah Smith's cfDNA result is high risk due to a low fetal fraction (3.9% at 10 weeks). Low fetal fraction has been associated with early gestational age, high maternal weight, sub-optimal sample collection, pregnancy loss, low molecular weight heparin, chromosomal abnormalities and can be a normal variation. Given that there was a low fetal fraction in Rebekah Smith's blood sample the laboratory was unable to obtain a reliable result using standard cfDNA screening methods. Therefore, they performed an additional analysis incorporating fetal fraction, maternal age, maternal weight, and gestational age. Based upon the results of the additional analysis, the current pregnancy is at high risk (1 in 69) for Triploidy, Trisomy 1, or Trisomy 48. The risks for  Trisomy 21 and Monosomy X are unchanged. Genetic counseling reviewed this result and information about the clinical features of Triploidy, Trisomy 42, and Trisomy 72.   Advanced Maternal Age: Rebekah Smith is 37 years old.  Therefore, we reviewed the age related chance for chromosome conditions and option of screening versus diagnostic testing for all chromosome conditions.  At 37 years old in the first trimester, the chance for Down syndrome is estimated to be 1 in 240 and the chance for any chromosome difference is in 114.  Further testing options: It is recommended that Rebekah Smith have her anatomy scan performed around 18-20 weeks' gestation in Maternal Fetal Medicine. She was counseled that 90-95% of fetuses with trisomy 38, trisomy 64, or triploidy demonstrate a sign of the respective conditions on anatomy ultrasound. She has already been scheduled for this appointment on 05/06/2023.  Redrawing a new sample for NIPS is possible. Now that she is a few weeks further along in her pregnancy, the fetal fraction may be higher. If the redraw was successful and contained a sufficient fetal fraction, results could clarify the risks for chromosomal aneuploidy in the current pregnancy. Rebekah Smith was counseled that we could pursue NIPS through [facility] or a different laboratory. However, there is still a chance that a new sample may not contain a sufficient fetal fraction. Additionally, other NIPS laboratories are not able to assess for triploidy; only Panorama NIPS through [facility] is able to provide a risk for triploidy in a pregnancy.   Diagnostic testing via [facility] at 12 weeks or amniocentesis available from 16 weeks' gestation. We discussed the technical aspects of the procedure and quoted up to a 1 in  500 (0.2%) risk for spontaneous pregnancy loss or other adverse pregnancy outcomes as a result of amniocentesis. Cultured cells from an amniocentesis sample allow for the visualization of a fetal karyotype, which can detect  >99% of large chromosomal aberrations. Chromosomal microarray can also be performed to identify smaller deletions or duplications of fetal chromosomal material. Rebekah Smith  was informed that invasive diagnostic testing is the most definitive way to determine whether or not a fetus has a chromosomal abnormality in the prenatal period.   Pregnancy and Family History: We obtained a detailed pregnancy history and family history.  Rebekah Smith has four sons from prior relationships, ages 51 to 15 years, all of whom are in good health.  She also had one miscarriage with a prior partner.  She and Rebekah Smith have had one early miscarriage and the current pregnancy.  She reported vaginal bleeding earlier today for which she visited her OB.  She reported that the ultrasound at that visit showed a normal heartbeat. She denied the use of tobacco, alcohol and recreational drugs.  In the family history, Rebekah Smith reported that she had a maternal half sister who passed away at 21 days of life due to Trisomy 26.  Trisomy 9 is a rare chromosome condition in which there is an extra copy of the genetic information from chromosome 9 in all or a portion of the cells (known as mosaic) of a persons body. The effects of this condition include growth restriction, heart defects, craniofacial differences, other birth defects and intellectual disability.  Babies with full Trisomy 9 often pass away during pregnancy or soon after birth.  While most cases of Trisomy occur due to sporadic mistakes in formation of the egg or sperm cells which do not run in families, some are the result of chromosome rearrangements in a parent.  Without medical records with the chromosome testing on this sister, we cannot rule out the chance for a familial chromosome condition. Rebekah Smith reported that he had one sister who passed away at 79 months of age due to SIDS.  The specific cause of SIDS is often not known but may be due to both environmental or genetic causes.  In the  absence of a known cause, it is difficult to provide an accurate estimate of the chance for other family members to have a similar outcome. The remainder of the family history was unremarkable for birth defects, intellectual delays, recurrent pregnancy loss or known genetic conditions.  Carrier screening. Per the ACOG Committee Opinion 691, all women who are considering a pregnancy or are currently pregnant should be offered carrier screening for, at minimum, Cystic Fibrosis (CF), Spinal Muscular Atrophy (SMA), and Hemoglobinopathies. The mode of inheritance, clinical manifestations of these conditions, as well as details about testing were reviewed. A negative result on carrier screening reduces the likelihood of being a carrier, however, does not entirely rule out the possibility. If Rebekah Smith was found to be a carrier for a specific condition, carrier screening for their reproductive partner would be recommended. More expanded panels of conditions can also be considered if desired.  Plan of care: Detailed anatomy ultrasound scheduled at Maternal Fetal Care at [redacted] weeks gestation. Rebekah Smith desires repeat NIPS at this time and is scheduled for a lab only visit on 03/19/23. The patient may also desire the [employer] Basic carrier screening panel at the time of her lab visit.   We appreciate being involved in the care of this family and can be reached at [phone] with questions or  concerns.  Rebekah Anderson, MS, CGC  I provided 40 minutes of non-face-to-face time during this encounter.   Katrina Stack

## 2023-03-19 ENCOUNTER — Ambulatory Visit: Payer: Medicaid Other | Attending: Obstetrics and Gynecology

## 2023-03-19 DIAGNOSIS — Z31430 Encounter of female for testing for genetic disease carrier status for procreative management: Secondary | ICD-10-CM

## 2023-03-19 DIAGNOSIS — O285 Abnormal chromosomal and genetic finding on antenatal screening of mother: Secondary | ICD-10-CM

## 2023-03-20 ENCOUNTER — Encounter: Payer: Medicaid Other | Admitting: Obstetrics and Gynecology

## 2023-03-28 ENCOUNTER — Telehealth: Payer: Self-pay

## 2023-03-28 LAB — PANORAMA PRENATAL TEST FULL PANEL:PANORAMA TEST PLUS 5 ADDITIONAL MICRODELETIONS: FETAL FRACTION: 7.5

## 2023-03-28 NOTE — Telephone Encounter (Signed)
I spoke with the patient to return her recent Panorama cell-free DNA (cfDNA) screening results. The result is low risk, consistent with a female fetus. Fetal sex was not shared, as she did not want to know. This screening significantly reduces but does not eliminate the chance that the current pregnancy has Down syndrome (trisomy 96), trisomy 19, trisomy 55, and common sex chromosome conditions. Please see report for details. Additionally, there are many genetic conditions that cannot be detected by NIPS.    Sheppard Plumber, MS Genetic Counselor [facility] for Maternal Fetal Care [phone]

## 2023-04-02 LAB — HORIZON CUSTOM: REPORT SUMMARY: NEGATIVE

## 2023-04-03 NOTE — L&D Delivery Note (Signed)
 OB/GYN Faculty Practice Delivery Note  Rebekah Smith is a 38 y.o. [id number] s/p SVD at [redacted]w[redacted]d. She was admitted for IOL for cHTN. She has a history of several postpartum hemorrhages   ROM: 1h 30m with clear fluid GBS Status: Negative Maximum Maternal Temperature: 98.1  Labor Progress: Began IOL with Pitocin  titration, then progressed precipitously after AROM. TXA given as prophylaxis as soon as dilation complete due to history of PPH with all previous deliveries.  Delivery Date/Time: 09/09/23 at 1607 Delivery: Called to room and patient was complete and and ready to push. Hand over hand delivery done with FOB. Head delivered ROA. Loose nuchal cord present. Shoulder and body delivered in usual fashion. Infant with spontaneous cry, placed on mother's abdomen, dried and stimulated. Cord clamped x 2 after 3-minute delay, and cut by FOB. Cord blood drawn. Placenta delivered spontaneously, intact, with 3-vessel cord. Fundus firm with massage and Pitocin . Labia, perineum, vagina, and cervix inspected, no laceration found. 800mcg cytotec  placed rectally for postpartum hemorrhage prophylaxis.  Placenta: intact, spontaneous, sent to L&D for disposal Complications: None Lacerations: None EBL: Analgesia: epidural  Postpartum Planning [x]  transfer orders to MB [x]  discharge summary started & shared [x]  message to sent to schedule follow-up  [x]  lists updated  Infant: Boy(y)  APGARs 8/9  3730g  Lemuel Quaker, CNM, IBCLC Certified Nurse Midwife, [employer] for [employer], [facility] 09/09/2023, 7:44 PM

## 2023-04-04 ENCOUNTER — Telehealth: Payer: Self-pay

## 2023-04-04 NOTE — Telephone Encounter (Signed)
 I called the patient to discuss her recent [employer] carrier screening results. She was not found to be a carrier for the four conditions screened for (alpha thalassemia, beta-hemoglobinopathies, cystic fibrosis, and spinal muscular atrophy). This significantly reduces but does not eliminate the chance that she is a carrier for those conditions. Please see report for details.  Lauraine Bodily, MS Genetic Counselor [facility] [phone]

## 2023-04-08 ENCOUNTER — Encounter: Payer: Self-pay | Admitting: Obstetrics & Gynecology

## 2023-04-08 ENCOUNTER — Ambulatory Visit (INDEPENDENT_AMBULATORY_CARE_PROVIDER_SITE_OTHER): Payer: Medicaid Other | Admitting: Obstetrics & Gynecology

## 2023-04-08 VITALS — BP 136/86 | HR 65 | Wt 160.0 lb

## 2023-04-08 DIAGNOSIS — Z1331 Encounter for screening for depression: Secondary | ICD-10-CM | POA: Diagnosis not present

## 2023-04-08 DIAGNOSIS — Z3480 Encounter for supervision of other normal pregnancy, unspecified trimester: Secondary | ICD-10-CM

## 2023-04-08 DIAGNOSIS — O09299 Supervision of pregnancy with other poor reproductive or obstetric history, unspecified trimester: Secondary | ICD-10-CM

## 2023-04-08 DIAGNOSIS — O09522 Supervision of elderly multigravida, second trimester: Secondary | ICD-10-CM | POA: Insufficient documentation

## 2023-04-08 DIAGNOSIS — O09892 Supervision of other high risk pregnancies, second trimester: Secondary | ICD-10-CM | POA: Insufficient documentation

## 2023-04-08 MED ORDER — METOPROLOL SUCCINATE ER 50 MG PO TB24: 50.0000 mg | ORAL_TABLET | Freq: Every day | ORAL | 5 refills | Status: DC

## 2023-04-08 MED ORDER — ASPIRIN 81 MG PO CAPS: 162.0000 mg | ORAL_CAPSULE | Freq: Every day | ORAL | 8 refills | Status: AC

## 2023-04-08 NOTE — Progress Notes (Signed)
   PRENATAL VISIT NOTE  Subjective:  Rebekah Smith is a 38 y.o. [phone] at [redacted]w[redacted]d being seen today for ongoing prenatal care.  She is currently monitored for the following issues for this high-risk pregnancy and has History of postpartum hemorrhage; History of pre-eclampsia in prior pregnancy, currently pregnant; Kell isoimmunization during pregnancy; ASCUS with positive high risk HPV cervical; Family history of breast cancer; Supervision of high risk pregnancy; AMA (advanced maternal age) multigravida 35+, second trimester; and Hx PTD X 2, [redacted]w[redacted]d, [redacted]w[redacted]d on their problem list.  Patient reports headache.  Contractions: Not present. Vag. Bleeding: None.  Movement: Absent. Denies leaking of fluid.   The following portions of the patient's history were reviewed and updated as appropriate: allergies, current medications, past family history, past medical history, past social history, past surgical history and problem list.   Objective:   Vitals:   04/08/23 1529  BP: (!) 152/94  Pulse: 69  Weight: 160 lb (72.6 kg)   Vitals:   04/08/23 1529 04/08/23 1606  BP: (!) 152/94 136/86  Pulse: 69 65  Weight: 160 lb (72.6 kg)      Fetal Status: Fetal Heart Rate (bpm): 147   Movement: Absent     General:  Alert, oriented and cooperative. Patient is in no acute distress.  Skin: Skin is warm and dry. No rash noted.   Cardiovascular: Normal heart rate noted  Respiratory: Normal respiratory effort, no problems with respiration noted  Abdomen: Soft, gravid, appropriate for gestational age.  Pain/Pressure: Absent     Pelvic: Cervical exam deferred        Extremities: Normal range of motion.  Edema: None  Mental Status: Normal mood and affect. Normal behavior. Normal judgment and thought content.   Assessment and Plan:  Pregnancy: [id number] at [redacted]w[redacted]d 1. Supervision of high risk pregnancy (Primary) MFM scan at look at subchorionic hematoma AFP today  2. History of pre-eclampsia in prior pregnancy,  currently pregnant 162 mg asa daily (increasing from 81)  3.  HTN BP 140s/90s and elevated today Will increase to metropolol 50 mg daily   4.  Headache Refer to Darice Nance Gaskins.   Preterm labor symptoms and general obstetric precautions including but not limited to vaginal bleeding, contractions, leaking of fluid and fetal movement were reviewed in detail with the patient. Please refer to After Visit Summary for other counseling recommendations.   No follow-ups on file.  Future Appointments  Date Time Provider Department Center  04/12/2023  7:15 AM [facility] NURSE [facility]  04/12/2023  7:30 AM [facility] US4 [facility]  05/06/2023  2:15 PM [facility] NURSE [facility]  05/06/2023  2:30 PM [facility] US4 [facility]    Burnard Pate, MD

## 2023-04-10 LAB — AFP, SERUM, OPEN SPINA BIFIDA
AFP MoM: 0.64
AFP Value: 17.9 ng/mL
Gest. Age on Collection Date: 15.1 wk
Maternal Age At EDD: 35.5 a
OSBR Risk 1 IN: 10000
Test Results:: NEGATIVE
Weight: 160 [lb_av]

## 2023-04-12 ENCOUNTER — Other Ambulatory Visit: Payer: Self-pay | Admitting: *Deleted

## 2023-04-12 ENCOUNTER — Ambulatory Visit: Payer: Medicaid Other

## 2023-04-12 ENCOUNTER — Other Ambulatory Visit: Payer: Self-pay | Admitting: Obstetrics & Gynecology

## 2023-04-12 ENCOUNTER — Other Ambulatory Visit: Payer: Self-pay | Admitting: Obstetrics and Gynecology

## 2023-04-12 ENCOUNTER — Other Ambulatory Visit: Payer: Self-pay

## 2023-04-12 ENCOUNTER — Ambulatory Visit (HOSPITAL_BASED_OUTPATIENT_CLINIC_OR_DEPARTMENT_OTHER): Payer: Medicaid Other | Admitting: Obstetrics

## 2023-04-12 ENCOUNTER — Ambulatory Visit: Payer: Medicaid Other | Attending: Obstetrics & Gynecology

## 2023-04-12 DIAGNOSIS — O10912 Unspecified pre-existing hypertension complicating pregnancy, second trimester: Secondary | ICD-10-CM | POA: Diagnosis not present

## 2023-04-12 DIAGNOSIS — O09522 Supervision of elderly multigravida, second trimester: Secondary | ICD-10-CM | POA: Insufficient documentation

## 2023-04-12 DIAGNOSIS — O361920 Maternal care for other isoimmunization, second trimester, not applicable or unspecified: Secondary | ICD-10-CM | POA: Diagnosis not present

## 2023-04-12 DIAGNOSIS — O09212 Supervision of pregnancy with history of pre-term labor, second trimester: Secondary | ICD-10-CM | POA: Diagnosis not present

## 2023-04-12 DIAGNOSIS — Z3480 Encounter for supervision of other normal pregnancy, unspecified trimester: Secondary | ICD-10-CM | POA: Diagnosis present

## 2023-04-12 DIAGNOSIS — Z363 Encounter for antenatal screening for malformations: Secondary | ICD-10-CM | POA: Insufficient documentation

## 2023-04-12 DIAGNOSIS — O09292 Supervision of pregnancy with other poor reproductive or obstetric history, second trimester: Secondary | ICD-10-CM | POA: Diagnosis not present

## 2023-04-12 DIAGNOSIS — R87610 Atypical squamous cells of undetermined significance on cytologic smear of cervix (ASC-US): Secondary | ICD-10-CM

## 2023-04-12 DIAGNOSIS — Z3A35 35 weeks gestation of pregnancy: Secondary | ICD-10-CM | POA: Insufficient documentation

## 2023-04-12 DIAGNOSIS — Z3A15 15 weeks gestation of pregnancy: Secondary | ICD-10-CM | POA: Insufficient documentation

## 2023-04-12 DIAGNOSIS — O468X2 Other antepartum hemorrhage, second trimester: Secondary | ICD-10-CM | POA: Diagnosis not present

## 2023-04-12 DIAGNOSIS — O09299 Supervision of pregnancy with other poor reproductive or obstetric history, unspecified trimester: Secondary | ICD-10-CM

## 2023-04-12 DIAGNOSIS — O09892 Supervision of other high risk pregnancies, second trimester: Secondary | ICD-10-CM

## 2023-04-12 NOTE — Progress Notes (Signed)
 MFM Note  Rebekah Smith is currently at 15 weeks and 5 days.  She was seen due to advanced maternal age (38 years old), chronic hypertension treated with metoprolol  50 mg daily, and Kell isoimmunization.  Her blood pressures today were 132/97 and 143/97.  She reports that a subchorionic hematoma was noted earlier in her pregnancy.  She has 2 prior indicated late preterm deliveries at around 34 and 35 weeks due to severe preeclampsia.  Due to her elevated blood pressures, her metoprolol  dose was recently increased to 50 mg daily.  She is taking 2 baby aspirins daily for preeclampsia prophylaxis.  The patient has a history of postpartum hemorrhage requiring a blood transfusion following the delivery of her second child in 2012.  She believes that she was exposed to the Kell antigen from the blood transfusion.  The FOB reports that he was screened and is negative for the Kell antigen on his red blood cells.  This is their first child together.  Her most recent anti-Kell antibody titer level drawn one month ago was too weak to titer.  Her initial cell free DNA test indicated a high risk for triploidy, trisomy 53 and 1.  However, a repeat cell free DNA test indicated a low risk for trisomy 21, 18, and 13. A female fetus is predicted.   She was informed that the fetal biometry measurements obtained today are consistent with an [facility] of September 29, 2023, making her 15 weeks and 5 days pregnant today.  An early fetal anatomy scan performed today did not reveal any obvious fetal anomalies.  A female fetus is noted.  A normal-appearing anterior placenta was noted today.  There were no signs of a subchorionic hematoma.  The peak systolic velocity of the middle cerebral artery was less than 1.5 multiple of the median for her gestational age, indicating that her fetus is not anemic at this time.   There were no signs of fetal hydrops noted on today's exam. The patient was reassured by today's findings.   The  following were discussed during today's consultation:  Kell isoimmunization  Due to Kell isoimmunization, the increased risk of fetal anemia should the fetus have the Kell antigen on its red blood cells was discussed.  As the FOB reports that he does not carry the Kell antigen on his red blood cells and if she was exposed to the Kell antigen from her prior blood transfusion, it is unlikely that the baby will have the Kell antigen on its red blood cells and therefore the risk of fetal anemia is low.  Please verify that the FOB was screened.  Chronic hypertension and pregnancy  The patient was advised that should her blood pressures continue to be elevated, the dosage of metoprolol  may need to be increased.    Alternatively, she may need to be switched to a different antihypertensive medication such as nifedipine  or labetalol  for better blood pressure control.  The increased risk of superimposed preeclampsia, an indicated preterm delivery, and possible fetal growth restriction due to chronic hypertension in pregnancy was discussed.   She was advised to continue taking 2 tablets of baby aspirin  daily for preeclampsia prophylaxis.    We will continue to follow her with monthly growth scans.   Weekly fetal testing should be started at around 32 weeks.   She will return in 3 weeks for a detailed fetal anatomy scan.  We will reassess the MCA Doppler studies at her next exam to screen for fetal anemia.  The patient and her partner stated that all of their questions were answered today.  A total of 60 minutes was spent counseling and coordinating the care for this patient.  Greater than 50% of the time was spent in direct face-to-face contact.

## 2023-05-06 ENCOUNTER — Ambulatory Visit: Payer: Medicaid Other | Admitting: *Deleted

## 2023-05-06 ENCOUNTER — Ambulatory Visit: Payer: Medicaid Other | Attending: Obstetrics & Gynecology

## 2023-05-06 ENCOUNTER — Other Ambulatory Visit: Payer: Self-pay | Admitting: *Deleted

## 2023-05-06 ENCOUNTER — Ambulatory Visit: Payer: Medicaid Other

## 2023-05-06 VITALS — BP 125/70 | HR 91

## 2023-05-06 DIAGNOSIS — O10012 Pre-existing essential hypertension complicating pregnancy, second trimester: Secondary | ICD-10-CM | POA: Diagnosis not present

## 2023-05-06 DIAGNOSIS — O09529 Supervision of elderly multigravida, unspecified trimester: Secondary | ICD-10-CM

## 2023-05-06 DIAGNOSIS — O361920 Maternal care for other isoimmunization, second trimester, not applicable or unspecified: Secondary | ICD-10-CM | POA: Diagnosis not present

## 2023-05-06 DIAGNOSIS — Z363 Encounter for antenatal screening for malformations: Secondary | ICD-10-CM | POA: Insufficient documentation

## 2023-05-06 DIAGNOSIS — Z3A19 19 weeks gestation of pregnancy: Secondary | ICD-10-CM | POA: Diagnosis not present

## 2023-05-06 DIAGNOSIS — O09292 Supervision of pregnancy with other poor reproductive or obstetric history, second trimester: Secondary | ICD-10-CM

## 2023-05-06 DIAGNOSIS — O09212 Supervision of pregnancy with history of pre-term labor, second trimester: Secondary | ICD-10-CM

## 2023-05-06 DIAGNOSIS — O09522 Supervision of elderly multigravida, second trimester: Secondary | ICD-10-CM

## 2023-05-06 DIAGNOSIS — Z3480 Encounter for supervision of other normal pregnancy, unspecified trimester: Secondary | ICD-10-CM

## 2023-05-06 DIAGNOSIS — O10919 Unspecified pre-existing hypertension complicating pregnancy, unspecified trimester: Secondary | ICD-10-CM

## 2023-05-13 ENCOUNTER — Ambulatory Visit (INDEPENDENT_AMBULATORY_CARE_PROVIDER_SITE_OTHER): Payer: Medicaid Other | Admitting: Obstetrics & Gynecology

## 2023-05-13 VITALS — BP 162/95 | HR 97 | Wt 172.0 lb

## 2023-05-13 DIAGNOSIS — Z3480 Encounter for supervision of other normal pregnancy, unspecified trimester: Secondary | ICD-10-CM

## 2023-05-13 DIAGNOSIS — O09292 Supervision of pregnancy with other poor reproductive or obstetric history, second trimester: Secondary | ICD-10-CM | POA: Diagnosis not present

## 2023-05-13 DIAGNOSIS — O361910 Maternal care for other isoimmunization, first trimester, not applicable or unspecified: Secondary | ICD-10-CM | POA: Diagnosis not present

## 2023-05-13 DIAGNOSIS — Z3A20 20 weeks gestation of pregnancy: Secondary | ICD-10-CM

## 2023-05-13 DIAGNOSIS — O09299 Supervision of pregnancy with other poor reproductive or obstetric history, unspecified trimester: Secondary | ICD-10-CM

## 2023-05-13 MED ORDER — LABETALOL HCL 200 MG PO TABS: 200.0000 mg | ORAL_TABLET | Freq: Two times a day (BID) | ORAL | 3 refills | Status: DC

## 2023-05-13 NOTE — Progress Notes (Addendum)
 Pain above pubic bone(sharp) and has had a H/A for several days    PRENATAL VISIT NOTE  Subjective:  Rebekah Smith is a 38 y.o. [phone] at [redacted]w[redacted]d being seen today for ongoing prenatal care.  She is currently monitored for the following issues for this high-risk pregnancy and has History of postpartum hemorrhage; History of pre-eclampsia in prior pregnancy, currently pregnant; Kell isoimmunization during pregnancy; ASCUS with positive high risk HPV cervical; Family history of breast cancer; Supervision of high risk pregnancy; AMA (advanced maternal age) multigravida 35+, second trimester; and Hx PTD X 2, 102w6d, [redacted]w[redacted]d on their problem list.  Patient reports headache.  Contractions: Not present. Vag. Bleeding: None.  Movement: Present. Denies leaking of fluid.   The following portions of the patient's history were reviewed and updated as appropriate: allergies, current medications, past family history, past medical history, past social history, past surgical history and problem list.   Objective:   Vitals:   05/13/23 1556  BP: (!) 147/94  Pulse: 89  Weight: 172 lb (78 kg)    Fetal Status: Fetal Heart Rate (bpm): 139   Movement: Present     General:  Alert, oriented and cooperative. Patient is in no acute distress.  Skin: Skin is warm and dry. No rash noted.   Cardiovascular: Normal heart rate noted  Respiratory: Normal respiratory effort, no problems with respiration noted  Abdomen: Soft, gravid, appropriate for gestational age.  Pain/Pressure: Present     Pelvic: Cervical exam deferred        Extremities: Normal range of motion.  Edema: None  Mental Status: Normal mood and affect. Normal behavior. Normal judgment and thought content.   Assessment and Plan:  Pregnancy: [id number] at [redacted]w[redacted]d   HTN--BP elevated.  Will switch to labetalol  200 mg bid. Take BP daily at home to titrate.  Will check PreE labs as she is >20 weeks and having headaches and has hx of Pre E with prior pregnancies.   HA--pressure message helped  Trigger point injections done today at occipital condyle--1/2 cc 1% lidocaine  each side.   Kell isoimmunization--FOB was tested in our lab and is negative.  I have seen the test result on his [email or web address].  Should also be under media tab. Followed by [facility] with scans and MCA sopplers.  RTC 3 weeks--pt to let us  know in the mean time if BP is controled via my chart.   Preterm labor symptoms and general obstetric precautions including but not limited to vaginal bleeding, contractions, leaking of fluid and fetal movement were reviewed in detail with the patient. Please refer to After Visit Summary for other counseling recommendations.   No follow-ups on file.  Future Appointments  Date Time Provider Department Center  06/03/2023  3:15 PM [facility] NURSE [facility]  06/03/2023  3:30 PM [facility] US2 [facility]  06/28/2023 11:00 AM Teague Gordon Latus [facility]  07/01/2023  3:15 PM [facility] NURSE [facility]  07/01/2023  3:30 PM [facility] US3 [facility]    Glorietta Lark, MD

## 2023-05-14 LAB — COMPREHENSIVE METABOLIC PANEL
ALT: 7 [IU]/L (ref 0–32)
AST: 15 [IU]/L (ref 0–40)
Albumin: 3.9 g/dL (ref 3.9–4.9)
Alkaline Phosphatase: 52 [IU]/L (ref 44–121)
BUN/Creatinine Ratio: 18 (ref 9–23)
BUN: 11 mg/dL (ref 6–20)
Bilirubin Total: 0.3 mg/dL (ref 0.0–1.2)
CO2: 20 mmol/L (ref 20–29)
Calcium: 9.2 mg/dL (ref 8.7–10.2)
Chloride: 102 mmol/L (ref 96–106)
Creatinine, Ser: 0.62 mg/dL (ref 0.57–1.00)
Globulin, Total: 2.1 g/dL (ref 1.5–4.5)
Glucose: 92 mg/dL (ref 70–99)
Potassium: 4.1 mmol/L (ref 3.5–5.2)
Sodium: 140 mmol/L (ref 134–144)
Total Protein: 6 g/dL (ref 6.0–8.5)
eGFR: 119 mL/min/{1.73_m2} (ref >59)

## 2023-05-14 LAB — CBC
Hematocrit: 37.3 % (ref 34.0–46.6)
Hemoglobin: 12.6 g/dL (ref 11.1–15.9)
MCH: 31.2 pg (ref 26.6–33.0)
MCHC: 33.8 g/dL (ref 31.5–35.7)
MCV: 92 fL (ref 79–97)
Platelets: 185 10*3/uL (ref 150–450)
RBC: 4.04 x10E6/uL (ref 3.77–5.28)
RDW: 12.3 % (ref 11.7–15.4)
WBC: 7.6 10*3/uL (ref 3.4–10.8)

## 2023-05-14 LAB — PROTEIN / CREATININE RATIO, URINE
Creatinine, Urine: 77.2 mg/dL
Protein, Ur: 10.3 mg/dL
Protein/Creat Ratio: 133 mg/g{creat} (ref 0–200)

## 2023-05-15 NOTE — Progress Notes (Signed)
Please see genetic counseling note on 03/18/2023 I provided general supervision for this patient and was immediately available for any patient care concerns. Jamonica Schoff [employer]

## 2023-05-16 ENCOUNTER — Encounter: Payer: Self-pay | Admitting: Obstetrics & Gynecology

## 2023-05-18 ENCOUNTER — Encounter: Payer: Self-pay | Admitting: Obstetrics & Gynecology

## 2023-05-21 ENCOUNTER — Other Ambulatory Visit: Payer: Self-pay

## 2023-05-21 DIAGNOSIS — K439 Ventral hernia without obstruction or gangrene: Secondary | ICD-10-CM

## 2023-06-03 ENCOUNTER — Ambulatory Visit: Payer: Medicaid Other

## 2023-06-03 ENCOUNTER — Ambulatory Visit: Payer: Medicaid Other | Attending: Obstetrics and Gynecology | Admitting: *Deleted

## 2023-06-03 ENCOUNTER — Ambulatory Visit (INDEPENDENT_AMBULATORY_CARE_PROVIDER_SITE_OTHER): Payer: Medicaid Other | Admitting: Obstetrics & Gynecology

## 2023-06-03 ENCOUNTER — Other Ambulatory Visit: Payer: Self-pay

## 2023-06-03 ENCOUNTER — Encounter: Payer: Self-pay | Admitting: *Deleted

## 2023-06-03 VITALS — BP 120/79 | HR 76

## 2023-06-03 VITALS — BP 141/90 | HR 80 | Wt 175.0 lb

## 2023-06-03 DIAGNOSIS — O09292 Supervision of pregnancy with other poor reproductive or obstetric history, second trimester: Secondary | ICD-10-CM

## 2023-06-03 DIAGNOSIS — Z3480 Encounter for supervision of other normal pregnancy, unspecified trimester: Secondary | ICD-10-CM | POA: Diagnosis not present

## 2023-06-03 DIAGNOSIS — O09299 Supervision of pregnancy with other poor reproductive or obstetric history, unspecified trimester: Secondary | ICD-10-CM | POA: Diagnosis not present

## 2023-06-03 DIAGNOSIS — O3662X0 Maternal care for excessive fetal growth, second trimester, not applicable or unspecified: Secondary | ICD-10-CM

## 2023-06-03 DIAGNOSIS — K429 Umbilical hernia without obstruction or gangrene: Secondary | ICD-10-CM | POA: Insufficient documentation

## 2023-06-03 DIAGNOSIS — Z3A23 23 weeks gestation of pregnancy: Secondary | ICD-10-CM

## 2023-06-03 DIAGNOSIS — O361920 Maternal care for other isoimmunization, second trimester, not applicable or unspecified: Secondary | ICD-10-CM

## 2023-06-03 DIAGNOSIS — O09212 Supervision of pregnancy with history of pre-term labor, second trimester: Secondary | ICD-10-CM

## 2023-06-03 DIAGNOSIS — O09522 Supervision of elderly multigravida, second trimester: Secondary | ICD-10-CM

## 2023-06-03 DIAGNOSIS — R519 Headache, unspecified: Secondary | ICD-10-CM

## 2023-06-03 DIAGNOSIS — O26899 Other specified pregnancy related conditions, unspecified trimester: Secondary | ICD-10-CM | POA: Diagnosis not present

## 2023-06-03 DIAGNOSIS — O10012 Pre-existing essential hypertension complicating pregnancy, second trimester: Secondary | ICD-10-CM

## 2023-06-03 DIAGNOSIS — O10912 Unspecified pre-existing hypertension complicating pregnancy, second trimester: Secondary | ICD-10-CM | POA: Insufficient documentation

## 2023-06-03 DIAGNOSIS — O10919 Unspecified pre-existing hypertension complicating pregnancy, unspecified trimester: Secondary | ICD-10-CM

## 2023-06-03 DIAGNOSIS — O09529 Supervision of elderly multigravida, unspecified trimester: Secondary | ICD-10-CM

## 2023-06-03 DIAGNOSIS — Z3A35 35 weeks gestation of pregnancy: Secondary | ICD-10-CM | POA: Diagnosis not present

## 2023-06-03 MED ORDER — AMLODIPINE BESYLATE 5 MG PO TABS: 5.0000 mg | ORAL_TABLET | Freq: Every day | ORAL | 11 refills | Status: DC

## 2023-06-03 NOTE — Progress Notes (Signed)
   PRENATAL VISIT NOTE  Subjective:  Rebekah Smith is a 38 y.o. [phone] at [redacted]w[redacted]d being seen today for ongoing prenatal care.  She is currently monitored for the following issues for this high-risk pregnancy and has History of postpartum hemorrhage; History of pre-eclampsia in prior pregnancy, currently pregnant; Kell isoimmunization during pregnancy; ASCUS with positive high risk HPV cervical; Family history of breast cancer; Supervision of high risk pregnancy; AMA (advanced maternal age) multigravida 35+, second trimester; and Hx PTD X 2, [redacted]w[redacted]d, [redacted]w[redacted]d on their problem list.  Patient reports headache.  Contractions: Not present. Vag. Bleeding: None.  Movement: Present. Denies leaking of fluid.   The following portions of the patient's history were reviewed and updated as appropriate: allergies, current medications, past family history, past medical history, past social history, past surgical history and problem list.   Objective:   Vitals:   06/03/23 1314  BP: (!) 141/90  Pulse: 80  Weight: 175 lb (79.4 kg)    Fetal Status:     Movement: Present     General:  Alert, oriented and cooperative. Patient is in no acute distress.  Skin: Skin is warm and dry. No rash noted.   Cardiovascular: Normal heart rate noted  Respiratory: Normal respiratory effort, no problems with respiration noted  Abdomen: Soft, gravid, appropriate for gestational age.  +umbilical hernia Pain/Pressure: Absent     Pelvic: Cervical exam deferred        Extremities: Normal range of motion.  Edema: None  Mental Status: Normal mood and affect. Normal behavior. Normal judgment and thought content.   Assessment and Plan:  Pregnancy: [id number] at [redacted]w[redacted]d 1. Supervision of high risk pregnancy (Primary)  2. History of pre-eclampsia in prior pregnancy, currently pregnant Rpt labs again today given headache (constant headache).  Referred to [facility] for better HA management.  DTR 1+ (not hyper reflexic)  3.  CHTN and  pregnant Labetalol makes her fatigued and would like to change meds in lieu of going up on labetalol.  Will change to norvasc 5 mg.   4.  Umbilical hernia Intermittent pain especially with bending.  Refer to general surgery.  Aware of when to seek emergency evaluation. Pt thinking of bilateral tubal ligation and will sign papers today.    Preterm labor symptoms and general obstetric precautions including but not limited to vaginal bleeding, contractions, leaking of fluid and fetal movement were reviewed in detail with the patient. Please refer to After Visit Summary for other counseling recommendations.   No follow-ups on file.  Future Appointments  Date Time Provider Department Center  06/03/2023  3:15 PM [facility] NURSE [facility]  06/03/2023  3:30 PM [facility] US2 [facility]  06/28/2023 11:00 AM Teague Docia Chuck [facility]  07/01/2023  3:15 PM [facility] NURSE [facility]  07/01/2023  3:30 PM [facility] US3 [facility]    Elsie Lincoln, MD

## 2023-06-04 LAB — CBC
Hematocrit: 38.1 % (ref 34.0–46.6)
Hemoglobin: 13.1 g/dL (ref 11.1–15.9)
MCH: 31.7 pg (ref 26.6–33.0)
MCHC: 34.4 g/dL (ref 31.5–35.7)
MCV: 92 fL (ref 79–97)
Platelets: 198 10*3/uL (ref 150–450)
RBC: 4.13 x10E6/uL (ref 3.77–5.28)
RDW: 11.8 % (ref 11.7–15.4)
WBC: 7.3 10*3/uL (ref 3.4–10.8)

## 2023-06-04 LAB — COMPREHENSIVE METABOLIC PANEL
ALT: 11 IU/L (ref 0–32)
AST: 15 IU/L (ref 0–40)
Albumin: 3.9 g/dL (ref 3.9–4.9)
Alkaline Phosphatase: 54 IU/L (ref 44–121)
BUN/Creatinine Ratio: 19 (ref 9–23)
BUN: 12 mg/dL (ref 6–20)
Bilirubin Total: 0.4 mg/dL (ref 0.0–1.2)
CO2: 20 mmol/L (ref 20–29)
Calcium: 8.9 mg/dL (ref 8.7–10.2)
Chloride: 104 mmol/L (ref 96–106)
Creatinine, Ser: 0.64 mg/dL (ref 0.57–1.00)
Globulin, Total: 2.4 g/dL (ref 1.5–4.5)
Glucose: 78 mg/dL (ref 70–99)
Potassium: 4.3 mmol/L (ref 3.5–5.2)
Sodium: 139 mmol/L (ref 134–144)
Total Protein: 6.3 g/dL (ref 6.0–8.5)
eGFR: 118 mL/min/{1.73_m2} (ref >59)

## 2023-06-04 LAB — PROTEIN / CREATININE RATIO, URINE
Creatinine, Urine: 67.1 mg/dL
Protein, Ur: 4.8 mg/dL
Protein/Creat Ratio: 72 mg/g{creat} (ref 0–200)

## 2023-06-07 ENCOUNTER — Encounter: Payer: Self-pay | Admitting: Obstetrics & Gynecology

## 2023-06-13 ENCOUNTER — Encounter: Payer: Self-pay | Admitting: Obstetrics and Gynecology

## 2023-06-13 ENCOUNTER — Encounter (HOSPITAL_COMMUNITY): Payer: Self-pay | Admitting: Obstetrics and Gynecology

## 2023-06-13 ENCOUNTER — Inpatient Hospital Stay (HOSPITAL_COMMUNITY)
Admission: AD | Admit: 2023-06-13 | Discharge: 2023-06-13 | Disposition: A | Discharge status: Home / Self Care | Attending: Obstetrics and Gynecology | Admitting: Obstetrics and Gynecology

## 2023-06-13 ENCOUNTER — Telehealth: Payer: Self-pay

## 2023-06-13 ENCOUNTER — Inpatient Hospital Stay (HOSPITAL_COMMUNITY)

## 2023-06-13 DIAGNOSIS — O09292 Supervision of pregnancy with other poor reproductive or obstetric history, second trimester: Secondary | ICD-10-CM | POA: Insufficient documentation

## 2023-06-13 DIAGNOSIS — R109 Unspecified abdominal pain: Secondary | ICD-10-CM | POA: Diagnosis not present

## 2023-06-13 DIAGNOSIS — R1033 Periumbilical pain: Secondary | ICD-10-CM | POA: Insufficient documentation

## 2023-06-13 DIAGNOSIS — O26892 Other specified pregnancy related conditions, second trimester: Secondary | ICD-10-CM | POA: Insufficient documentation

## 2023-06-13 DIAGNOSIS — Z3A24 24 weeks gestation of pregnancy: Secondary | ICD-10-CM | POA: Diagnosis not present

## 2023-06-13 DIAGNOSIS — K429 Umbilical hernia without obstruction or gangrene: Secondary | ICD-10-CM | POA: Diagnosis not present

## 2023-06-13 DIAGNOSIS — O26899 Other specified pregnancy related conditions, unspecified trimester: Secondary | ICD-10-CM

## 2023-06-13 DIAGNOSIS — O99612 Diseases of the digestive system complicating pregnancy, second trimester: Secondary | ICD-10-CM | POA: Diagnosis not present

## 2023-06-13 NOTE — Telephone Encounter (Signed)
 Pictures reviewed. Unable to tell by pictures. Recommended evaluation due to pain.

## 2023-06-13 NOTE — MAU Provider Note (Signed)
 History     CSN: [id number]  Arrival date and time: 06/13/23 1726   None     Chief Complaint  Patient presents with   Abdominal Pain   HPI Patient is a 38 year old G8 P2-2-3-4 at 24 weeks and 4 days presenting for painful umbilical hernia.  Patient reports that over the last 24 hours the pain has gotten considerably worse and she is extremely tender.  Reports that she is able to push it and it decreases in size but is incredibly uncomfortable.  Denies any issues with constipation, diarrhea.  Denies any fever or chills.  Denies any other symptoms.  OB History     Gravida  8   Para  4   Term  2   Preterm  2   AB  3   Living  4      SAB  3   IAB      Ectopic      Multiple  0   Live Births  4           Past Medical History:  Diagnosis Date   Abnormal Pap smear of cervix    colposcopy   Anemia    Blood transfusion    Chlamydia infection 02/28/2022   Hemorrhage    History of preeclampsia    Had in two last pregnancies, denies chronic hypertension as she is normotensive when not pregnant   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  01/10/16 Titer 4  [x]  MFM referral- serial MCA doppler  FOB is negative so no further testing is needed (pregnancy 2023--[name])   Migraines    Pregnancy induced hypertension     Past Surgical History:  Procedure Laterality Date   TONSILLECTOMY      Family History  Problem Relation Age of Onset   Anxiety disorder Mother    Hypertension Mother    Heart murmur Mother    Breast cancer Maternal Aunt    Breast cancer Paternal Grandmother    Cervical cancer Maternal Aunt    Stroke Maternal Grandmother     Social History   Tobacco Use   Smoking status: Never   Smokeless tobacco: Never  Vaping Use   Vaping status: Never Used  Substance Use Topics   Alcohol use: No   Drug use: No    Allergies: No Known Allergies  Medications Prior to Admission   Medication Sig Dispense Refill Last Dose/Taking   Aspirin 81 MG CAPS Take 162 mg by mouth daily. 60 capsule 8 06/12/2023   labetalol (NORMODYNE) 200 MG tablet Take 200 mg by mouth 2 (two) times daily.   06/13/2023 at 12:00 PM   Prenatal Vit-Fe Fumarate-FA (PRENATAL VITAMINS PO) Take by mouth.   06/12/2023   amLODipine (NORVASC) 5 MG tablet Take 1 tablet (5 mg total) by mouth daily. 30 tablet 11     Review of Systems  Constitutional:  Negative for chills and fever.  Respiratory:  Negative for shortness of breath.   Cardiovascular:  Negative for chest pain.  Gastrointestinal:  Positive for abdominal pain. Negative for constipation, diarrhea, nausea and vomiting.  Genitourinary:  Negative for vaginal bleeding.  Neurological:  Negative for headaches.  All other systems reviewed and are negative.  Physical Exam   Blood pressure 122/87, pulse 87, temperature 98.3 F (36.8 C), temperature source Oral, resp. rate 18, height 5\' 7"  (1.702 m), weight 80.7 kg, last menstrual period 06/13/2022, SpO2 100%.  Physical Exam Vitals reviewed.  Constitutional:  Appearance: She is well-developed.  HENT:     Head: Normocephalic and atraumatic.  Cardiovascular:     Rate and Rhythm: Normal rate and regular rhythm.  Pulmonary:     Effort: Pulmonary effort is normal.  Abdominal:     Tenderness: There is abdominal tenderness in the periumbilical area.     Comments: Gravid  Skin:    Capillary Refill: Capillary refill takes less than 2 seconds.  Neurological:     General: No focal deficit present.     Mental Status: She is alert.  Psychiatric:        Mood and Affect: Mood normal.    MAU Course  Procedures  MDM Abdominal ultrasound limited   Assessment and Plan  TEIGHLOR KORSON is a 38 yo [id number] @ [redacted]w[redacted]d presenting for umbilical pain around umbilical hernia.  Painful umbilical hernia Abdominal ultrasound collected to evaluate for incarcerated hernia.  On review abdominal contents seem to  be coming through the hernia but reduced spontaneously.  Official read pending at the time of shift change.  General surgery was also consulted given patient's considerable abdominal tenderness.  Handed off to oncoming provider who will follow-up and determine next steps in patient's management.   Celedonio Savage 06/13/2023, 6:39 PM   A; Single IUP at [redacted]w[redacted]d Umbilical hernia with intermittent omentum herniating  P; Seen by Dr Sheliah Hatch He states the hernia is stable with intermittent omentum herniating through which is reduceable He recommends followup in office  Abdominal binder obtained.   Folded 4x4s placed over umbilicus under abdominal binder.  Recommend keeping it on while up and around Guard against straining Discussed how to self-reduce.  Encouraged to return if she develops worsening of symptoms, increase in pain, fever, or other concerning symptoms.   Aviva Signs, CNM

## 2023-06-13 NOTE — MAU Provider Note (Incomplete Revision)
 History     CSN: [id number]  Arrival date and time: 06/13/23 1726   None     Chief Complaint  Patient presents with   Abdominal Pain   HPI Patient is a 38 year old G8 P2-2-3-4 at 24 weeks and 4 days presenting for painful umbilical hernia.  Patient reports that over the last 24 hours the pain has gotten considerably worse and she is extremely tender.  Reports that she is able to push it and it decreases in size but is incredibly uncomfortable.  Denies any issues with constipation, diarrhea.  Denies any fever or chills.  Denies any other symptoms.  OB History     Gravida  8   Para  4   Term  2   Preterm  2   AB  3   Living  4      SAB  3   IAB      Ectopic      Multiple  0   Live Births  4           Past Medical History:  Diagnosis Date   Abnormal Pap smear of cervix    colposcopy   Anemia    Blood transfusion    Chlamydia infection 02/28/2022   Hemorrhage    History of preeclampsia    Had in two last pregnancies, denies chronic hypertension as she is normotensive when not pregnant   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  01/10/16 Titer 4  [x]  MFM referral- serial MCA doppler  FOB is negative so no further testing is needed (pregnancy 2023--[name])   Migraines    Pregnancy induced hypertension     Past Surgical History:  Procedure Laterality Date   TONSILLECTOMY      Family History  Problem Relation Age of Onset   Anxiety disorder Mother    Hypertension Mother    Heart murmur Mother    Breast cancer Maternal Aunt    Breast cancer Paternal Grandmother    Cervical cancer Maternal Aunt    Stroke Maternal Grandmother     Social History   Tobacco Use   Smoking status: Never   Smokeless tobacco: Never  Vaping Use   Vaping status: Never Used  Substance Use Topics   Alcohol use: No   Drug use: No    Allergies: No Known Allergies  Medications Prior to Admission   Medication Sig Dispense Refill Last Dose/Taking   Aspirin 81 MG CAPS Take 162 mg by mouth daily. 60 capsule 8 06/12/2023   labetalol (NORMODYNE) 200 MG tablet Take 200 mg by mouth 2 (two) times daily.   06/13/2023 at 12:00 PM   Prenatal Vit-Fe Fumarate-FA (PRENATAL VITAMINS PO) Take by mouth.   06/12/2023   amLODipine (NORVASC) 5 MG tablet Take 1 tablet (5 mg total) by mouth daily. 30 tablet 11     Review of Systems  Constitutional:  Negative for chills and fever.  Respiratory:  Negative for shortness of breath.   Cardiovascular:  Negative for chest pain.  Gastrointestinal:  Positive for abdominal pain. Negative for constipation, diarrhea, nausea and vomiting.  Genitourinary:  Negative for vaginal bleeding.  Neurological:  Negative for headaches.  All other systems reviewed and are negative.  Physical Exam   Blood pressure 122/87, pulse 87, temperature 98.3 F (36.8 C), temperature source Oral, resp. rate 18, height 5\' 7"  (1.702 m), weight 80.7 kg, last menstrual period 06/13/2022, SpO2 100%.  Physical Exam Vitals reviewed.  Constitutional:  Appearance: She is well-developed.  HENT:     Head: Normocephalic and atraumatic.  Cardiovascular:     Rate and Rhythm: Normal rate and regular rhythm.  Pulmonary:     Effort: Pulmonary effort is normal.  Abdominal:     Tenderness: There is abdominal tenderness in the periumbilical area.     Comments: Gravid  Skin:    Capillary Refill: Capillary refill takes less than 2 seconds.  Neurological:     General: No focal deficit present.     Mental Status: She is alert.  Psychiatric:        Mood and Affect: Mood normal.    MAU Course  Procedures  MDM Abdominal ultrasound limited   Assessment and Plan  Rebekah Smith is a 38 yo [id number] @ [redacted]w[redacted]d presenting for umbilical pain around umbilical hernia.  Painful umbilical hernia Abdominal ultrasound collected to evaluate for incarcerated hernia.  On review abdominal contents seem to  be coming through the hernia but reduced spontaneously.  Official read pending at the time of shift change.  General surgery was also consulted given patient's considerable abdominal tenderness.  Handed off to oncoming provider who will follow-up and determine next steps in patient's management.   Celedonio Savage 06/13/2023, 6:39 PM

## 2023-06-13 NOTE — MAU Note (Signed)
 Rebekah Smith is a 38 y.o. at [redacted]w[redacted]d here in MAU reporting: has an umbilical hernia, last 24 hrs has had a lot of pain.  Called OB, was told if she could push it in and stays in she would probably be ok.  Won't stay in, pain continues.  No bleeding or leaking.  Reports +FM.  Denies diarrhea or constipation.   Onset of complaint: yesterday.  Pain score: 6 Vitals:   06/13/23 1744  BP: 131/87  Pulse: 88  Resp: 18  Temp: 99 F (37.2 C)  SpO2: 100%     FHT:154 Lab orders placed from triage:

## 2023-06-13 NOTE — Telephone Encounter (Signed)
 Pt called concerned about umbilical hernia pain. Pt states it is constant pain and the pain is a 6 out of 10. Per Dr.Duncan pt should go to MAU for evaluation. Pt expressed understanding.

## 2023-06-13 NOTE — Consult Note (Addendum)
 Rebekah Smith 04-30-1985  [id number].    Requesting MD: Mariel Aloe Chief Complaint/Reason for Consult: acute umbilical hernia  HPI:  38 yo female is 24 wga. She has had worsening periumbilical pain over the last 2 days. Pain is worse with movement and sitting in one position for long periods of time. She denies nausea, vomiting, or constipation.  ROS: Review of Systems  Constitutional: Negative.   HENT: Negative.    Eyes: Negative.   Respiratory: Negative.    Cardiovascular: Negative.   Gastrointestinal:  Positive for abdominal pain.  Genitourinary: Negative.   Musculoskeletal: Negative.   Skin: Negative.   Neurological: Negative.   Endo/Heme/Allergies: Negative.   Psychiatric/Behavioral: Negative.      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  01/10/16 Titer 4  [x]  MFM referral- serial MCA doppler  FOB is negative so no further testing is needed (pregnancy 2023--[name])   Migraines    Pregnancy induced hypertension     Past Surgical History:  Procedure Laterality Date   TONSILLECTOMY      Social History:  reports that she has never smoked. She has never used smokeless tobacco. She reports that she does not drink alcohol and does not use drugs.  Allergies: No Known Allergies  Medications Prior to Admission  Medication Sig Dispense Refill   Aspirin  81 MG CAPS Take 162 mg by mouth daily. 60 capsule 8   labetalol (NORMODYNE) 200 MG tablet Take 200 mg by mouth 2 (two) times daily.     Prenatal Vit-Fe Fumarate-FA (PRENATAL VITAMINS PO) Take by mouth.     amLODipine (NORVASC) 5 MG tablet Take 1 tablet (5 mg total) by mouth daily. 30 tablet 11    Physical Exam: Blood pressure 122/87, pulse 87, temperature 98.3 F (36.8 C), temperature source Oral, resp. rate 18, height 5\' 7"  (1.702 m), weight 80.7 kg, last menstrual period 06/13/2022, SpO2 100%. Gen: NAD Resp: nonlabored CV: RRR Abd: soft, soft reducible umbilical hernia with <1 cm defect Neuro: AOx4  No results found for this or any previous visit (from the past 48 hours). No results found.  Assessment/Plan I reviewed [place] images showing small amount of fluid in the hernia sac and small neck to the hernia. -I discussed with her the etiology of hernias and how they cause pain and potential complications of them. -I do not think any procedure should be done at this time and I do not think the hernia is obstructed or strangulated. -The hernia may continue to change over the pregnancy. She will likely have the hernia after pregnancy and may then benefit from repair. -I think a binder may help some of her symptoms of pain with movement -I do not think she needs to be admitted/observed for this pain -Follow up with  me as needed  I reviewed last 24 h vitals and pain scores, last 48 h intake and output, last 24 h labs and trends, and last 24 h imaging results.  De Blanch [facility] 06/13/2023, 8:36 PM Please see Amion for pager number during day hours 7:00am-4:30pm or 7:00am -11:30am on weekends

## 2023-06-17 ENCOUNTER — Ambulatory Visit (INDEPENDENT_AMBULATORY_CARE_PROVIDER_SITE_OTHER): Admitting: Obstetrics and Gynecology

## 2023-06-17 ENCOUNTER — Encounter: Payer: Self-pay | Admitting: Obstetrics and Gynecology

## 2023-06-17 VITALS — BP 128/83 | HR 81 | Wt 182.0 lb

## 2023-06-17 DIAGNOSIS — O09892 Supervision of other high risk pregnancies, second trimester: Secondary | ICD-10-CM

## 2023-06-17 DIAGNOSIS — K429 Umbilical hernia without obstruction or gangrene: Secondary | ICD-10-CM

## 2023-06-17 DIAGNOSIS — Z3480 Encounter for supervision of other normal pregnancy, unspecified trimester: Secondary | ICD-10-CM

## 2023-06-17 DIAGNOSIS — O361910 Maternal care for other isoimmunization, first trimester, not applicable or unspecified: Secondary | ICD-10-CM | POA: Diagnosis not present

## 2023-06-17 DIAGNOSIS — O09522 Supervision of elderly multigravida, second trimester: Secondary | ICD-10-CM

## 2023-06-17 DIAGNOSIS — O09299 Supervision of pregnancy with other poor reproductive or obstetric history, unspecified trimester: Secondary | ICD-10-CM

## 2023-06-17 DIAGNOSIS — O26899 Other specified pregnancy related conditions, unspecified trimester: Secondary | ICD-10-CM

## 2023-06-17 DIAGNOSIS — O10919 Unspecified pre-existing hypertension complicating pregnancy, unspecified trimester: Secondary | ICD-10-CM

## 2023-06-17 DIAGNOSIS — R519 Headache, unspecified: Secondary | ICD-10-CM

## 2023-06-17 DIAGNOSIS — Z8759 Personal history of other complications of pregnancy, childbirth and the puerperium: Secondary | ICD-10-CM

## 2023-06-17 DIAGNOSIS — Z3A25 25 weeks gestation of pregnancy: Secondary | ICD-10-CM

## 2023-06-17 MED ORDER — PROCHLORPERAZINE MALEATE 10 MG PO TABS: 10.0000 mg | ORAL_TABLET | Freq: Four times a day (QID) | ORAL | 1 refills | Status: DC | PRN

## 2023-06-17 NOTE — Patient Instructions (Addendum)
 Headache Prevention Vitamin B12 100 mg daily Magnesium Glycinate 400 mg daily  Headache Treatment Benadryl OTC + Compazine

## 2023-06-17 NOTE — Progress Notes (Signed)
 PRENATAL VISIT NOTE  Subjective:  Rebekah Smith is a 38 y.o. [phone] at [redacted]w[redacted]d being seen today for ongoing prenatal care.  She is currently monitored for the following issues for this high-risk pregnancy and has History of postpartum hemorrhage; History of pre-eclampsia in prior pregnancy, currently pregnant; Chronic hypertension affecting pregnancy; Kell isoimmunization during pregnancy; ASCUS with positive high risk HPV cervical; Family history of breast cancer; Supervision of high risk pregnancy; AMA (advanced maternal age) multigravida 35+, second trimester; Hx PTD X 2, [redacted]w[redacted]d, [redacted]w[redacted]d; Hernia, umbilical; and Headache in pregnancy, antepartum on their problem list.  Patient reports no complaints.  Contractions: Not present. Vag. Bleeding: None.  Movement: Present. Denies leaking of fluid.   The following portions of the patient's history were reviewed and updated as appropriate: allergies, current medications, past family history, past medical history, past social history, past surgical history and problem list.   Objective:   Vitals:   06/17/23 1549  BP: 128/83  Pulse: 81  Weight: 182 lb (82.6 kg)    Fetal Status: Fetal Heart Rate (bpm): 140   Movement: Present     General:  Alert, oriented and cooperative. Patient is in no acute distress.  Skin: Skin is warm and dry. No rash noted.   Cardiovascular: Normal heart rate noted  Respiratory: Normal respiratory effort, no problems with respiration noted  Abdomen: Soft, gravid, appropriate for gestational age.  Pain/Pressure: Absent     Pelvic: Cervical exam deferred        Extremities: Normal range of motion.  Edema: None  Mental Status: Normal mood and affect. Normal behavior. Normal judgment and thought content.   Assessment and Plan:  Pregnancy: [id number] at [redacted]w[redacted]d 1. Chronic hypertension affecting pregnancy (Primary) Taking amlodipine 5 mg Growth is on 3/31.   2. Supervision of high risk pregnancy 28 week labs next  visit  3. Kell isoimmunization during pregnancy in first trimester, single or unspecified fetus From blood transfusion. FOB is Ag negative.  4. Hx PTD X 2, [redacted]w[redacted]d, [redacted]w[redacted]d Iatrogenic due to preeclampsia  5. History of pre-eclampsia in prior pregnancy, currently pregnant  6. History of postpartum hemorrhage Will do proactive anemia management.  TXA and Pitocin at delivery.  Discussed +/- Methergine depending on blood pressures at the time of delivery Reviewed low threshold for Jada if Pitocin and TXA don't show quick response.   7. Umbilical hernia without obstruction and without gangrene S/p Gen surgery consult. They discussed doing tubal and hernia repair concurrently. Patient would prefer one surgery together. Reviewed PPTL vs interval salpingectomy and she thinks she would prefer interval salpingectomy.   8. AMA (advanced maternal age) multigravida 35+, second trimester  33. Headache in pregnancy, antepartum Still getting headaches with blurred vision. Discussed compazine/benadryl to break cycle and then also vitamin b12 and magnesium to help with prevention.  Has appt with Nada Maclachlan for HA consult as well.   10. Pregnancy with 25 completed weeks gestation   Preterm labor symptoms and general obstetric precautions including but not limited to vaginal bleeding, contractions, leaking of fluid and fetal movement were reviewed in detail with the patient. Please refer to After Visit Summary for other counseling recommendations.   Return in about 4 weeks (around 07/15/2023) for 2 hr GTT/28w labs.  Future Appointments  Date Time Provider Department Center  06/28/2023 11:00 AM Teague Docia Chuck [facility]  07/01/2023  3:15 PM [facility] NURSE [facility]  07/01/2023  3:30 PM [facility] US3 [facility]  07/10/2023  8:50  AM Milas Hock, MD [facility]    Milas Hock, MD

## 2023-06-28 ENCOUNTER — Encounter: Payer: Self-pay | Admitting: Obstetrics & Gynecology

## 2023-06-28 ENCOUNTER — Institutional Professional Consult (permissible substitution): Payer: Medicaid Other | Admitting: Physician Assistant

## 2023-07-01 ENCOUNTER — Ambulatory Visit: Payer: Medicaid Other | Attending: Obstetrics and Gynecology | Admitting: *Deleted

## 2023-07-01 ENCOUNTER — Encounter (HOSPITAL_COMMUNITY): Payer: Self-pay | Admitting: Obstetrics and Gynecology

## 2023-07-01 ENCOUNTER — Inpatient Hospital Stay (HOSPITAL_COMMUNITY)
Admission: RE | Admit: 2023-07-01 | Discharge: 2023-07-01 | Disposition: A | Discharge status: Home / Self Care | Payer: Medicaid Other | Attending: Obstetrics and Gynecology | Admitting: Obstetrics and Gynecology

## 2023-07-01 ENCOUNTER — Ambulatory Visit (HOSPITAL_BASED_OUTPATIENT_CLINIC_OR_DEPARTMENT_OTHER): Payer: Medicaid Other

## 2023-07-01 ENCOUNTER — Other Ambulatory Visit: Payer: Self-pay

## 2023-07-01 ENCOUNTER — Ambulatory Visit (HOSPITAL_BASED_OUTPATIENT_CLINIC_OR_DEPARTMENT_OTHER): Admitting: Maternal & Fetal Medicine

## 2023-07-01 VITALS — BP 131/90

## 2023-07-01 VITALS — BP 135/94 | HR 85

## 2023-07-01 DIAGNOSIS — O3662X0 Maternal care for excessive fetal growth, second trimester, not applicable or unspecified: Secondary | ICD-10-CM | POA: Diagnosis not present

## 2023-07-01 DIAGNOSIS — R519 Headache, unspecified: Secondary | ICD-10-CM | POA: Diagnosis present

## 2023-07-01 DIAGNOSIS — O09292 Supervision of pregnancy with other poor reproductive or obstetric history, second trimester: Secondary | ICD-10-CM | POA: Insufficient documentation

## 2023-07-01 DIAGNOSIS — O10012 Pre-existing essential hypertension complicating pregnancy, second trimester: Secondary | ICD-10-CM

## 2023-07-01 DIAGNOSIS — O361920 Maternal care for other isoimmunization, second trimester, not applicable or unspecified: Secondary | ICD-10-CM | POA: Insufficient documentation

## 2023-07-01 DIAGNOSIS — O09529 Supervision of elderly multigravida, unspecified trimester: Secondary | ICD-10-CM | POA: Diagnosis present

## 2023-07-01 DIAGNOSIS — O10912 Unspecified pre-existing hypertension complicating pregnancy, second trimester: Secondary | ICD-10-CM | POA: Insufficient documentation

## 2023-07-01 DIAGNOSIS — Z3480 Encounter for supervision of other normal pregnancy, unspecified trimester: Secondary | ICD-10-CM

## 2023-07-01 DIAGNOSIS — O10919 Unspecified pre-existing hypertension complicating pregnancy, unspecified trimester: Secondary | ICD-10-CM

## 2023-07-01 DIAGNOSIS — Z3A27 27 weeks gestation of pregnancy: Secondary | ICD-10-CM | POA: Insufficient documentation

## 2023-07-01 DIAGNOSIS — O09522 Supervision of elderly multigravida, second trimester: Secondary | ICD-10-CM

## 2023-07-01 DIAGNOSIS — O3663X0 Maternal care for excessive fetal growth, third trimester, not applicable or unspecified: Secondary | ICD-10-CM | POA: Diagnosis not present

## 2023-07-01 DIAGNOSIS — O26899 Other specified pregnancy related conditions, unspecified trimester: Secondary | ICD-10-CM

## 2023-07-01 DIAGNOSIS — Z79899 Other long term (current) drug therapy: Secondary | ICD-10-CM | POA: Diagnosis not present

## 2023-07-01 DIAGNOSIS — O09212 Supervision of pregnancy with history of pre-term labor, second trimester: Secondary | ICD-10-CM | POA: Insufficient documentation

## 2023-07-01 LAB — CBC
HCT: 34.1 % — ABNORMAL LOW (ref 36.0–46.0)
Hemoglobin: 12 g/dL (ref 12.0–15.0)
MCH: 31.5 pg (ref 26.0–34.0)
MCHC: 35.2 g/dL (ref 30.0–36.0)
MCV: 89.5 fL (ref 80.0–100.0)
Platelets: 181 10*3/uL (ref 150–400)
RBC: 3.81 MIL/uL — ABNORMAL LOW (ref 3.87–5.11)
RDW: 12.7 % (ref 11.5–15.5)
WBC: 8.8 10*3/uL (ref 4.0–10.5)
nRBC: 0 % (ref 0.0–0.2)

## 2023-07-01 LAB — URINALYSIS, ROUTINE W REFLEX MICROSCOPIC
Bilirubin Urine: NEGATIVE
Glucose, UA: NEGATIVE mg/dL
Hgb urine dipstick: NEGATIVE
Ketones, ur: NEGATIVE mg/dL
Leukocytes,Ua: NEGATIVE
Nitrite: NEGATIVE
Protein, ur: NEGATIVE mg/dL
Specific Gravity, Urine: 1.018 (ref 1.005–1.030)
pH: 6 (ref 5.0–8.0)

## 2023-07-01 LAB — COMPREHENSIVE METABOLIC PANEL WITH GFR
ALT: 10 U/L (ref 0–44)
AST: 22 U/L (ref 15–41)
Albumin: 2.9 g/dL — ABNORMAL LOW (ref 3.5–5.0)
Alkaline Phosphatase: 45 U/L (ref 38–126)
Anion gap: 10 (ref 5–15)
BUN: 13 mg/dL (ref 6–20)
CO2: 18 mmol/L — ABNORMAL LOW (ref 22–32)
Calcium: 8.5 mg/dL — ABNORMAL LOW (ref 8.9–10.3)
Chloride: 106 mmol/L (ref 98–111)
Creatinine, Ser: 0.65 mg/dL (ref 0.44–1.00)
GFR, Estimated: >60 mL/min (ref >60)
Glucose, Bld: 123 mg/dL — ABNORMAL HIGH (ref 70–99)
Potassium: 3.7 mmol/L (ref 3.5–5.1)
Sodium: 134 mmol/L — ABNORMAL LOW (ref 135–145)
Total Bilirubin: 0.4 mg/dL (ref 0.0–1.2)
Total Protein: 5.5 g/dL — ABNORMAL LOW (ref 6.5–8.1)

## 2023-07-01 LAB — PROTEIN / CREATININE RATIO, URINE
Creatinine, Urine: 100 mg/dL
Protein Creatinine Ratio: 0.08 mg/mg{creat} (ref 0.00–0.15)
Total Protein, Urine: 8 mg/dL

## 2023-07-01 MED ORDER — DIPHENHYDRAMINE HCL 25 MG PO CAPS
25.0000 mg | ORAL_CAPSULE | ORAL | Status: AC
  Administered 2023-07-01: 25 mg via ORAL
  Filled 2023-07-01: qty 1

## 2023-07-01 MED ORDER — METOCLOPRAMIDE HCL 5 MG PO TABS: 5.0000 mg | ORAL_TABLET | Freq: Three times a day (TID) | ORAL | 0 refills | Status: DC | PRN

## 2023-07-01 MED ORDER — ACETAMINOPHEN-CAFFEINE 500-65 MG PO TABS
2.0000 | ORAL_TABLET | Freq: Once | ORAL | Status: AC
  Administered 2023-07-01: 2 via ORAL
  Filled 2023-07-01: qty 2

## 2023-07-01 MED ORDER — METOCLOPRAMIDE HCL 10 MG PO TABS
10.0000 mg | ORAL_TABLET | Freq: Once | ORAL | Status: AC
  Administered 2023-07-01: 10 mg via ORAL
  Filled 2023-07-01: qty 1

## 2023-07-01 NOTE — MAU Note (Signed)
 Rebekah Smith is a 38 y.o. at [redacted]w[redacted]d here in MAU reporting: was seen today @ MFM, while there had elevated BP, HA, and visual disturbances.  Reports had Pre Eclampsia with two previous pregnancies.  Reports current HA and blurred vision. States took Compazine for HA around "lunchtime".  Denies epigastric pain.  Denies VB or LOF.  Endorses +FM, less than usual.  LMP: NA Onset of complaint: today Pain score: 6 Vitals:   07/01/23 1720  BP: 137/88  Pulse: 91  Temp: 97.9 F (36.6 C)  SpO2: 100%     FHT: 143 bpm  Lab orders placed from triage: UA

## 2023-07-01 NOTE — Progress Notes (Signed)
 Patient information  Patient Name: Rebekah Smith  Patient MRN:   [id number]  Referring practice: MFM Referring Provider: [facility]  MFM CONSULT  Rebekah Smith is a 38 y.o. [id number] at [redacted]w[redacted]d here for ultrasound and consultation. 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 During today's visit we focused on the following concerns:    The patient is here for a follow-up BPP and growth ultrasound  at 27w 1d for [facility] and hx of preE. EDD: 09/29/2023 dated  by Early Ultrasound  (02/04/23). Today Rebekah Smith reports a  'pulsating headache of 5/10 behind her eyes. She reports  headaches all throughout prengnacy but this is worse today.  Her BP is 135/94 and she also has some "spots" seen on  occasion. Her new perscription for headaches have been  ineffective. She only takes IBU when not pregnant. I  discussed my concern for preeclampsia vs another cause of  the headache. I encouraged her to go to the MAU for blood  work and blood pressure monitoring.    Sonographic findings  Single intrauterine pregnancy.  Fetal cardiac activity: Observed.  Presentation: Breech.  Interval fetal anatomy appears normal.  Fetal biometry shows the estimated fetal weight at the 90  percentile.  Amniotic fluid: Within normal limits.   MVP: 6.56 cm.  Placenta: Posterior.  Normal movement and tone.    Recommendations  -Send to MAU due to headache and CHTN with hx of  preeclampsia. Needs BP monitoring and preE labs.  -Antenatal testing at 32 weeks or sooner if inidcated.  -Neurology consulation if headaches remain severe without  an underlying etiology  Review of Systems: A review of systems was performed and was negative except per HPI   Vitals and Physical Exam    07/01/2023    4:17 PM 07/01/2023    3:45 PM 06/17/2023    3:49 PM  Vitals with BMI  Weight   182 lbs  BMI   28.5  Systolic 131 135 865  Diastolic 90 94 83  Pulse  85 81    Sitting comfortably on the sonogram table Nonlabored breathing Normal rate and rhythm Abdomen is nontender  Past pregnancies OB History  Gravida Para Term Preterm AB Living  8 4 2 2 3 4   SAB IAB Ectopic Multiple Live Births  3   0 4    # Outcome Date GA Lbr Len/2nd Weight Sex Type Anes PTL Lv  8 Current           7 SAB 08/27/22 [redacted]w[redacted]d         6 Preterm 07/14/16 [redacted]w[redacted]d 21:04 / 00:15 7 lb 4.4 oz (3.3 kg) M Vag-Spont EPI  LIV     Birth Comments: PPH  5 Preterm 04/30/14 [redacted]w[redacted]d / 00:14 6 lb 2.1 oz (2.781 kg) M Vag-Spont EPI  LIV     Birth  Comments: within normal limits  4 SAB 06/09/13          3 Term 09/01/10 [redacted]w[redacted]d  9 lb 9 oz (4.338 kg) M Vag-Spont EPI N LIV     Birth Comments: PPH  2 Term 12/03/07 [redacted]w[redacted]d  8 lb 9 oz (3.884 kg) M Vag-Spont EPI N LIV  1 SAB              I spent 20 minutes reviewing the patients chart, including labs and images as well as counseling the patient about her medical conditions. Greater than 50% of the time was spent in direct face-to-face patient counseling.  Braxton Feathers, DO Maternal fetal medicine, [place]   07/01/2023  4:28 PM

## 2023-07-01 NOTE — Discharge Instructions (Signed)
Please check your blood pressure at least once daily. If your blood pressure is >140 (top number) or >90 (bottom number) consistently, please call your primary OB group If your blood pressure is >160 (top number) or >110 (bottom number), please return to the maternity assessment unit

## 2023-07-01 NOTE — MAU Provider Note (Signed)
 History     CSN: [id number]  Arrival date and time: 07/01/23 1711   Event Date/Time   First Provider Initiated Contact with Patient 07/01/2023  5:19 PM   Chief Complaint  Patient presents with   Headache   BP Evaluation    HPI  Rebekah Smith is a 38 y.o. [id number] at [redacted]w[redacted]d who presents to the MAU for PEC w/up. Pt with hx pre-e w SF, requiring IOL at 35wk. She reports new onset cHTN prior to this pregnancy, have been normotensive to mild range. She was seen in MFM office today, noted to have elevated BP there and reported headache and vision changes, prompting MAU eval today. Describes headache as "lingering pulsating pain behind my eyes". She was prescribed Compazine which she last took around 1200 today. She did not feel like it helped much. She also notes that her visual changes are overall blurred vision with occasional black spots in her vision. She denies RUQ pain, SOB, worsening edema. Pt states current symptoms are reminiscent of her symptoms prior to onset of PEC in past.  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  01/10/16 Titer 4  [x]  MFM referral- serial MCA doppler  FOB is negative so no further testing is needed (pregnancy 2023--[name])   Migraines    Pregnancy induced hypertension     Past Surgical History:  Procedure Laterality Date   TONSILLECTOMY      Family History  Problem Relation Age of Onset   Anxiety disorder Mother    Hypertension Mother    Heart murmur Mother    Breast cancer Maternal Aunt    Breast cancer Paternal Grandmother    Cervical cancer Maternal Aunt    Stroke Maternal Grandmother      Social History   Tobacco Use   Smoking status: Never   Smokeless tobacco: Never  Vaping Use   Vaping status: Never Used  Substance Use Topics   Alcohol use: No   Drug use: No    Allergies: No Known Allergies  Medications Prior to Admission  Medication Sig Dispense Refill Last Dose/Taking   amLODipine (NORVASC) 5 MG tablet Take 1 tablet (5 mg total) by mouth daily. 30 tablet 11 07/01/2023   Aspirin 81 MG CAPS Take 162 mg by mouth daily. 60 capsule 8 06/30/2023   Prenatal Vit-Fe Fumarate-FA (PRENATAL VITAMINS PO) Take by mouth.   06/30/2023   prochlorperazine (COMPAZINE) 10 MG tablet Take 1 tablet (10 mg total) by mouth every 6 (six) hours as needed (headaches). 30 tablet 1 07/01/2023    ROS reviewed and pertinent positives and negatives as documented in HPI.  Physical Exam   Blood pressure 137/88, pulse 91, temperature 97.9 F (36.6 C), temperature source Oral, height 5\' 7"  (1.702 m), weight 84.2 kg, last menstrual period 06/13/2022, SpO2 100%.  Physical Exam Constitutional:      General: She is not in acute distress.    Appearance: Normal appearance. She is not ill-appearing.  HENT:     Head: Normocephalic and atraumatic.  Cardiovascular:     Rate and Rhythm: Normal rate.  Pulmonary:     Effort:  Pulmonary effort is normal.     Breath sounds: Normal breath sounds.  Abdominal:     Palpations: Abdomen is soft.     Tenderness: There is no abdominal tenderness. There is no guarding.     Comments: No RUQ pain  Musculoskeletal:        General: Normal range of motion.  Skin:    General: Skin is warm and dry.     Findings: No rash.  Neurological:     General: No focal deficit present.     Mental Status: She is alert and oriented to person, place, and time.     Deep Tendon Reflexes: Reflexes normal.     Comments: No clonus BLE   EFM: 135/mod/+10x10a/-d  MAU Course  Procedures  MDM 37 y.o. [id number] at [redacted]w[redacted]d presenting for PEC w/up. Has significant PMHx of pre-e w SF  prompting preterm delivery. Now with headache and vision changes. BP normotensive since admit. Exam reassuring Cat I strip. Will order labs, cycle BP, and give Tylenol to see if headache resolves.   2034 Labs back -- PCR neg, Cr, LFTs, PLT all wnl. HA resolved w Reglan, Benadryl, and Excedrin. Suspect has migraine headache based on her photophobia, description of headache. Her BP have overall been normotensive with some that were high normal. Given the above, discussed importance of checking BP at home and given return precautions. Will defer adjustment of Amlodipine and message sent to schedule for BP check in [facility] office later this week.   Assessment and Plan  Headache in pregnancy, antepartum  Chronic hypertension affecting pregnancy - PEC workup wnl - HA resolved w migraine cocktail - Will keep Amlodipine dose the same for now - Needs clinic BP check later this week, message sent - Strict return precautions reviewed w pt   Sundra Aland, MD OB Fellow, Faculty Practice [facility], Center for [facility]  07/01/23, 6:02 PM

## 2023-07-02 ENCOUNTER — Other Ambulatory Visit: Payer: Self-pay

## 2023-07-02 DIAGNOSIS — O09522 Supervision of elderly multigravida, second trimester: Secondary | ICD-10-CM

## 2023-07-02 DIAGNOSIS — O10919 Unspecified pre-existing hypertension complicating pregnancy, unspecified trimester: Secondary | ICD-10-CM

## 2023-07-02 DIAGNOSIS — O09299 Supervision of pregnancy with other poor reproductive or obstetric history, unspecified trimester: Secondary | ICD-10-CM

## 2023-07-08 ENCOUNTER — Other Ambulatory Visit: Payer: Self-pay

## 2023-07-08 ENCOUNTER — Ambulatory Visit
Admission: EM | Admit: 2023-07-08 | Discharge: 2023-07-08 | Disposition: A | Discharge status: Home / Self Care | Attending: Family Medicine | Admitting: Family Medicine

## 2023-07-08 ENCOUNTER — Encounter: Payer: Self-pay | Admitting: Obstetrics & Gynecology

## 2023-07-08 DIAGNOSIS — U071 COVID-19: Secondary | ICD-10-CM | POA: Diagnosis not present

## 2023-07-08 DIAGNOSIS — Z3A28 28 weeks gestation of pregnancy: Secondary | ICD-10-CM | POA: Diagnosis not present

## 2023-07-08 DIAGNOSIS — O98512 Other viral diseases complicating pregnancy, second trimester: Secondary | ICD-10-CM | POA: Diagnosis not present

## 2023-07-08 LAB — POC SARS CORONAVIRUS 2 AG -  ED: SARS Coronavirus 2 Ag: POSITIVE — AB

## 2023-07-08 MED ORDER — PAXLOVID (300/100) 20 X 150 MG & 10 X 100MG PO TBPK: 3.0000 | ORAL_TABLET | Freq: Two times a day (BID) | ORAL | 0 refills | Status: AC

## 2023-07-08 NOTE — ED Triage Notes (Addendum)
 X 2 days has had sore throat, cough, congestion, thick mucus when coughing or blowing nose. No fever. Has taken ibuprofen for headache, tylenol cold and flu. Is [redacted] weeks pregnant. Does not want to be tested for covid.

## 2023-07-08 NOTE — ED Provider Notes (Signed)
 [facility]    CSN: [id number] Arrival date & time: 07/08/23  1223      History   Chief Complaint Chief Complaint  Patient presents with   Cough    HPI Rebekah Smith is a 38 y.o. female.   HPI  Patient is currently [redacted] weeks pregnant.  She has an advanced maternal age multigravida hypertension and history of preeclampsia with prior pregnancies.  She is here because of cough and congestion has been going on since yesterday.  She started with some symptoms Saturday night, yesterday felt more sick.  She states that she has a lot of head congestion, postnasal drip, cough, mucus.  She feels tired.  No headache or bodyaches.  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 Authorizing Provider  nirmatrelvir/ritonavir (PAXLOVID, 300/100,) 20 x 150 MG & 10 x 100MG  TBPK Take 3 tablets by mouth 2 (two) times daily for 5 days. Patient GFR is 60. Take nirmatrelvir (150 mg) two tablets twice daily for 5 days and ritonavir (100 mg) one tablet twice daily for 5 days. 07/08/23 07/13/23 Yes Eustace Moore, MD  amLODipine (NORVASC) 5 MG tablet Take 1 tablet (5 mg total) by mouth daily. 06/03/23 06/02/24  Lesly Dukes, MD  Aspirin 81 MG CAPS Take 162 mg by mouth daily. 04/08/23   Lesly Dukes, MD  metoCLOPramide (REGLAN) 5 MG tablet Take 1 tablet (5 mg total) by mouth every 8 (eight) hours as needed (migraine/headache). 07/01/23   Sundra Aland, MD  Prenatal Vit-Fe Fumarate-FA (PRENATAL VITAMINS PO) Take by mouth.    [provider]  prochlorperazine (COMPAZINE) 10 MG tablet  Take 1 tablet (10 mg total) by mouth every 6 (six) hours as needed (headaches). 06/17/23   Milas Hock, MD    Family History Family History  Problem Relation Age of Onset   Anxiety disorder Mother    Hypertension Mother    Heart murmur Mother    Breast cancer Maternal Aunt    Breast cancer Paternal Grandmother    Cervical cancer Maternal Aunt    Stroke Maternal Grandmother     Social History Social History   Tobacco Use   Smoking status: Never   Smokeless tobacco: Never  Vaping Use   Vaping status: Never Used  Substance Use Topics   Alcohol use: No   Drug use: No     Allergies   Patient has no known allergies.   Review of Systems Review of Systems  See HPI Physical Exam Triage Vital Signs ED Triage Vitals  Encounter Vitals Group     BP 07/08/23  1232 (!) 134/91     Systolic BP Percentile --      Diastolic BP Percentile --      Pulse Rate 07/08/23 1232 92     Resp 07/08/23 1232 16     Temp 07/08/23 1232 97.8 F (36.6 C)     Temp src --      SpO2 07/08/23 1232 98 %     Weight --      Height --      Head Circumference --      Peak Flow --      Pain Score 07/08/23 1235 5     Pain Loc --      Pain Education --      Exclude from Growth Chart --    No data found.  Updated Vital Signs BP (!) 134/91   Pulse 92   Temp 97.8 F (36.6 C)   Resp 16   LMP 12/02/2022 (Approximate)   SpO2 98%       Physical Exam Constitutional:      General: She is not in acute distress.    Appearance: She is well-developed. She is ill-appearing.  HENT:     Head: Normocephalic and atraumatic.     Right Ear: Tympanic membrane normal.     Left Ear: Tympanic membrane normal.     Nose: Congestion and rhinorrhea present.     Mouth/Throat:     Pharynx: Posterior oropharyngeal erythema present.  Eyes:     Conjunctiva/sclera: Conjunctivae normal.     Pupils: Pupils are equal, round, and reactive to light.  Cardiovascular:     Rate and Rhythm: Normal rate and regular rhythm.     Heart sounds: Normal heart sounds.  Pulmonary:     Effort: Pulmonary effort is normal. No respiratory distress.     Breath sounds: Rhonchi present.  Abdominal:     General: There is no distension.     Palpations: Abdomen is soft.     Comments: Gravid  Musculoskeletal:        General: Normal range of motion.     Cervical back: Normal range of motion and neck supple.  Lymphadenopathy:     Cervical: No cervical adenopathy.  Skin:    General: Skin is warm and dry.  Neurological:     Mental Status: She is alert.      UC Treatments / Results  Labs (all labs ordered are listed, but only abnormal results are displayed) Labs Reviewed  POC SARS CORONAVIRUS 2 AG -  ED - Abnormal; Notable  for the following components:      Result Value   SARS Coronavirus 2 Ag  Positive (*)    All other components within normal limits    EKG   Radiology No results found.  Procedures Procedures (including critical care time)  Medications Ordered in UC Medications - No data to display  Initial Impression / Assessment and Plan / UC Course  I have reviewed the triage vital signs and the nursing notes.  Pertinent labs & imaging results that were available during my care of the patient were reviewed by me and considered in my medical decision making (see chart for details).     I explained to the patient that she had a moderate to high risk pregnancy.  Pregnancy by itself is a risk factor for severe COVID.  Paxlovid is recommended.  I encouraged her to call her GYN for additional advice. Final Clinical Impressions(s) / UC Diagnoses   Final diagnoses:  COVID-19 affecting pregnancy in second trimester     Discharge Instructions      Take Paxlovid 2 times a day Get plenty of rest.  Drink lots of fluids May take over-the-counter cough or cold medicines as approved in pregnancy Call your OB/GYN for questions or complications    ED Prescriptions     Medication Sig Dispense Auth. Provider   nirmatrelvir/ritonavir (PAXLOVID, 300/100,) 20 x 150 MG & 10 x 100MG  TBPK Take 3 tablets by mouth 2 (two) times daily for 5 days. Patient GFR is 60. Take nirmatrelvir (150 mg) two tablets twice daily for 5 days and ritonavir (100 mg) one tablet twice daily for 5 days. 30 tablet Eustace Moore, MD      PDMP not reviewed this encounter.   Eustace Moore, MD 07/08/23 [phone]

## 2023-07-08 NOTE — Discharge Instructions (Signed)
 Take Paxlovid 2 times a day Get plenty of rest.  Drink lots of fluids May take over-the-counter cough or cold medicines as approved in pregnancy Call your OB/GYN for questions or complications

## 2023-07-09 NOTE — Progress Notes (Deleted)
   PRENATAL VISIT NOTE  Subjective:  Rebekah Smith is a 38 y.o. [phone] at [redacted]w[redacted]d being seen today for ongoing prenatal care.  She is currently monitored for the following issues for this high-risk pregnancy and has History of postpartum hemorrhage; History of pre-eclampsia in prior pregnancy, currently pregnant; Chronic hypertension affecting pregnancy; Kell isoimmunization during pregnancy; ASCUS with positive high risk HPV cervical; Family history of breast cancer; Supervision of high risk pregnancy; AMA (advanced maternal age) multigravida 35+, second trimester; Hx PTD X 2, [redacted]w[redacted]d, [redacted]w[redacted]d; Hernia, umbilical; and Headache in pregnancy, antepartum on their problem list.  Patient reports {sx:14538}.   .  .   . Denies leaking of fluid.   The following portions of the patient's history were reviewed and updated as appropriate: allergies, current medications, past family history, past medical history, past social history, past surgical history and problem list.   Objective:  There were no vitals filed for this visit.  Fetal Status:           General:  Alert, oriented and cooperative. Patient is in no acute distress.  Skin: Skin is warm and dry. No rash noted.   Cardiovascular: Normal heart rate noted  Respiratory: Normal respiratory effort, no problems with respiration noted  Abdomen: Soft, gravid, appropriate for gestational age.        Pelvic: Cervical exam deferred        Extremities: Normal range of motion.     Mental Status: Normal mood and affect. Normal behavior. Normal judgment and thought content.   Assessment and Plan:  Pregnancy: [id number] at [redacted]w[redacted]d 1. Chronic hypertension affecting pregnancy (Primary) Continue ldASA  2. Supervision of high risk pregnancy ***  3. AMA (advanced maternal age) multigravida 35+, second trimester ***  4. History of postpartum hemorrhage ***  5. History of pre-eclampsia in prior pregnancy, currently pregnant ***  6. Hx PTD X 2, [redacted]w[redacted]d,  [redacted]w[redacted]d ***  7. Kell isoimmunization during pregnancy in first trimester, single or unspecified fetus ***  8. Umbilical hernia without obstruction and without gangrene ***  9. Headache in pregnancy, antepartum ***  10. Pregnancy with 28 completed weeks gestation ***  {Blank single:19197::"Term","Preterm"} labor symptoms and general obstetric precautions including but not limited to vaginal bleeding, contractions, leaking of fluid and fetal movement were reviewed in detail with the patient. Please refer to After Visit Summary for other counseling recommendations.   No follow-ups on file.  Future Appointments  Date Time Provider Department Center  07/10/2023  8:50 AM Milas Hock, MD [facility]  08/05/2023  3:00 PM [facility] PROVIDER 1 [facility]  08/05/2023  3:30 PM [facility] US5 [facility]  08/09/2023  9:30 AM Teague Edwena Blow, PA-C [facility]  08/12/2023  3:00 PM [facility] NURSE [facility]  08/12/2023  3:15 PM [facility] NST [facility]  08/19/2023  3:00 PM [facility] NURSE [facility]  08/19/2023  3:15 PM [facility] NST [facility]  08/27/2023  3:00 PM [facility] NURSE [facility]  08/27/2023  3:15 PM [facility] NST [facility]  09/02/2023  3:00 PM [facility] PROVIDER 1 [facility]  09/02/2023  3:30 PM [facility] US5 [facility]    Milas Hock, MD

## 2023-07-10 ENCOUNTER — Encounter: Admitting: Obstetrics and Gynecology

## 2023-07-17 ENCOUNTER — Encounter: Payer: Self-pay | Admitting: Obstetrics and Gynecology

## 2023-07-18 ENCOUNTER — Ambulatory Visit: Admitting: Obstetrics and Gynecology

## 2023-07-18 ENCOUNTER — Encounter: Payer: Self-pay | Admitting: Obstetrics and Gynecology

## 2023-07-18 VITALS — BP 110/74 | HR 81 | Wt 185.2 lb

## 2023-07-18 DIAGNOSIS — O09522 Supervision of elderly multigravida, second trimester: Secondary | ICD-10-CM

## 2023-07-18 DIAGNOSIS — O361930 Maternal care for other isoimmunization, third trimester, not applicable or unspecified: Secondary | ICD-10-CM | POA: Diagnosis not present

## 2023-07-18 DIAGNOSIS — Z3A29 29 weeks gestation of pregnancy: Secondary | ICD-10-CM | POA: Diagnosis not present

## 2023-07-18 DIAGNOSIS — R87810 Cervical high risk human papillomavirus (HPV) DNA test positive: Secondary | ICD-10-CM

## 2023-07-18 DIAGNOSIS — O09892 Supervision of other high risk pregnancies, second trimester: Secondary | ICD-10-CM | POA: Diagnosis not present

## 2023-07-18 DIAGNOSIS — Z23 Encounter for immunization: Secondary | ICD-10-CM | POA: Diagnosis not present

## 2023-07-18 DIAGNOSIS — O3663X0 Maternal care for excessive fetal growth, third trimester, not applicable or unspecified: Secondary | ICD-10-CM

## 2023-07-18 DIAGNOSIS — Z8759 Personal history of other complications of pregnancy, childbirth and the puerperium: Secondary | ICD-10-CM

## 2023-07-18 DIAGNOSIS — R87610 Atypical squamous cells of undetermined significance on cytologic smear of cervix (ASC-US): Secondary | ICD-10-CM

## 2023-07-18 DIAGNOSIS — O10919 Unspecified pre-existing hypertension complicating pregnancy, unspecified trimester: Secondary | ICD-10-CM | POA: Diagnosis not present

## 2023-07-18 DIAGNOSIS — Z3480 Encounter for supervision of other normal pregnancy, unspecified trimester: Secondary | ICD-10-CM

## 2023-07-18 NOTE — Progress Notes (Signed)
 CC: Patient has changed back to labetalol from amlodipine because she was too lethargic on the amlodipine.

## 2023-07-18 NOTE — Progress Notes (Signed)
   PRENATAL VISIT NOTE  Subjective:  Rebekah Smith is a 38 y.o. [phone] at [redacted]w[redacted]d being seen today for ongoing prenatal care.  She is currently monitored for the following issues for this high-risk pregnancy and has History of postpartum hemorrhage; History of pre-eclampsia in prior pregnancy, currently pregnant; Chronic hypertension affecting pregnancy; Kell isoimmunization during pregnancy; ASCUS with positive high risk HPV cervical; Family history of breast cancer; Supervision of high risk pregnancy; AMA (advanced maternal age) multigravida 35+, second trimester; Hx PTD X 2, [redacted]w[redacted]d, 106w0d; Hernia, umbilical; and Headache in pregnancy, antepartum on their problem list.  Patient reports  doing well overall .  Contractions: Not present. Vag. Bleeding: None.  Movement: Present. Denies leaking of fluid.   The following portions of the patient's history were reviewed and updated as appropriate: allergies, current medications, past family history, past medical history, past social history, past surgical history and problem list.   Objective:   Vitals:   07/18/23 0839  BP: 110/74  Pulse: 81  Weight: 185 lb 4 oz (84 kg)    Fetal Status: Fetal Heart Rate (bpm): 137   Movement: Present     General:  Alert, oriented and cooperative. Patient is in no acute distress.  Skin: Skin is warm and dry. No rash noted.   Cardiovascular: Normal heart rate noted  Respiratory: Normal respiratory effort, no problems with respiration noted  Abdomen: Soft, gravid, appropriate for gestational age.  Pain/Pressure: Absent      Assessment and Plan:  Pregnancy: [id number] at [redacted]w[redacted]d 1. Supervision of high risk pregnancy (Primary) 2. [redacted] weeks gestation of pregnancy Tdap today, labs as below - CBC - HIV antibody (with reflex) - RPR - Glucose Tolerance, 2 Hours w/1 Hour  3. Kell isoimmunization during pregnancy in third trimester, single or unspecified fetus FOB antigen testing negative  4. Hx PTD X 2, [redacted]w[redacted]d,  [redacted]w[redacted]d Iatrogenic d/t preeclampsia  5. Chronic hypertension affecting pregnancy 6. LGA Normotensive today No HA complaints, but has appt w/ Nada Maclachlan on 5/9 for HA management Switched back to labetalol 200mg  BID d/t side effects from amlodipine (fatigue). Feeling good w/ labetalol  Normal baseline labs Growth 3/31 1248g (90%)- next scheduled 5/5 w/ weekly BPP Continue ldASA IOL by 39 weeks  6. AMA (advanced maternal age) multigravida 35+, second trimester  7. History of postpartum hemorrhage CBC today, optimize Hgb Plan pit, TXA at del; low threshold for additional uterotonics/JADA  8. Sterilization Tentative plan for interval tubal with concomitant umbilical hernia repair  Please refer to After Visit Summary for other counseling recommendations.   Return in about 2 weeks (around 08/01/2023) for return OB at 31 weeks.  Future Appointments  Date Time Provider Department Center  08/05/2023  3:00 PM [facility] PROVIDER 1 [facility]  08/05/2023  3:30 PM [facility] US5 [facility]  08/09/2023  9:30 AM Teague Edwena Blow, PA-C [facility]  08/12/2023  3:00 PM [facility] NURSE [facility]  08/12/2023  3:15 PM [facility] NST [facility]  08/19/2023  3:00 PM [facility] NURSE [facility]  08/19/2023  3:15 PM [facility] NST [facility]  08/27/2023  3:00 PM [facility] NURSE [facility]  08/27/2023  3:15 PM [facility] NST [facility]  09/02/2023  3:00 PM [facility] PROVIDER 1 [facility]  09/02/2023  3:30 PM [facility] US5 [facility]    Lennart Pall, MD

## 2023-07-19 ENCOUNTER — Encounter: Payer: Self-pay | Admitting: Obstetrics and Gynecology

## 2023-07-19 LAB — HIV ANTIBODY (ROUTINE TESTING W REFLEX): HIV Screen 4th Generation wRfx: NONREACTIVE

## 2023-07-19 LAB — GLUCOSE TOLERANCE, 2 HOURS W/ 1HR
Glucose, 1 hour: 113 mg/dL (ref 70–179)
Glucose, 2 hour: 90 mg/dL (ref 70–152)
Glucose, Fasting: 79 mg/dL (ref 70–91)

## 2023-07-19 LAB — CBC
Hematocrit: 34 % (ref 34.0–46.6)
Hemoglobin: 11.8 g/dL (ref 11.1–15.9)
MCH: 31.6 pg (ref 26.6–33.0)
MCHC: 34.7 g/dL (ref 31.5–35.7)
MCV: 91 fL (ref 79–97)
Platelets: 201 10*3/uL (ref 150–450)
RBC: 3.74 x10E6/uL — ABNORMAL LOW (ref 3.77–5.28)
RDW: 12 % (ref 11.7–15.4)
WBC: 7 10*3/uL (ref 3.4–10.8)

## 2023-07-19 LAB — RPR: RPR Ser Ql: NONREACTIVE

## 2023-07-30 ENCOUNTER — Ambulatory Visit (INDEPENDENT_AMBULATORY_CARE_PROVIDER_SITE_OTHER): Admitting: Obstetrics and Gynecology

## 2023-07-30 VITALS — BP 130/83 | HR 76 | Wt 193.0 lb

## 2023-07-30 DIAGNOSIS — Z3A31 31 weeks gestation of pregnancy: Secondary | ICD-10-CM

## 2023-07-30 DIAGNOSIS — O10919 Unspecified pre-existing hypertension complicating pregnancy, unspecified trimester: Secondary | ICD-10-CM

## 2023-07-30 DIAGNOSIS — O10913 Unspecified pre-existing hypertension complicating pregnancy, third trimester: Secondary | ICD-10-CM

## 2023-07-30 DIAGNOSIS — Z3480 Encounter for supervision of other normal pregnancy, unspecified trimester: Secondary | ICD-10-CM

## 2023-07-30 NOTE — Progress Notes (Signed)
   PRENATAL VISIT NOTE  Subjective:  Rebekah Smith is a 38 y.o. [phone] at [redacted]w[redacted]d being seen today for ongoing prenatal care.  She is currently monitored for the following issues for this high-risk pregnancy and has History of postpartum hemorrhage; History of pre-eclampsia in prior pregnancy, currently pregnant; Chronic hypertension affecting pregnancy; Kell isoimmunization during pregnancy; ASCUS with positive high risk HPV cervical; Family history of breast cancer; Supervision of high risk pregnancy; AMA (advanced maternal age) multigravida 35+, second trimester; Hx PTD X 2, [redacted]w[redacted]d, [redacted]w[redacted]d; Hernia, umbilical; Headache in pregnancy, antepartum; and Excessive fetal growth affecting management of mother in third trimester, antepartum on their problem list.  Patient reports no complaints.  Contractions: Irregular. Vag. Bleeding: None.  Movement: Present. Denies leaking of fluid.   The following portions of the patient's history were reviewed and updated as appropriate: allergies, current medications, past family history, past medical history, past social history, past surgical history and problem list.   Objective:   Vitals:   07/30/23 1555  BP: 130/83  Pulse: 76  Weight: 193 lb (87.5 kg)    Fetal Status: Fetal Heart Rate (bpm): 128 Fundal Height: 35 cm Movement: Present     General:  Alert, oriented and cooperative. Patient is in no acute distress.  Skin: Skin is warm and dry. No rash noted.   Cardiovascular: Normal heart rate noted  Respiratory: Normal respiratory effort, no problems with respiration noted  Abdomen: Soft, gravid, appropriate for gestational age.  Pain/Pressure: Present     Pelvic: Cervical exam deferred        Extremities: Normal range of motion.  Edema: Trace  Mental Status: Normal mood and affect. Normal behavior. Normal judgment and thought content.   Assessment and Plan:  Pregnancy: [id number] at [redacted]w[redacted]d  1. Chronic hypertension affecting pregnancy  (Primary)  Taking BASA 162 mg Taking Labetalol  200 mg BID, recommend taking TID dosing given elevations in the evenings when checking.  She checks her BP at home at night time.  Reports 139-144/89-95  Seeing HA provider next week due to continued HA. Patient is Concerned about timing of delivery. Will review and discuss with MFM. 37w may be appropriate given LGA, uncontrolled HTN on meds and history of PPH.   2. Supervision of high risk pregnancy  Normal 2 hour GTT Antenatal testing to begin next week.  Preterm labor symptoms and general obstetric precautions including but not limited to vaginal bleeding, contractions, leaking of fluid and fetal movement were reviewed in detail with the patient. Please refer to After Visit Summary for other counseling recommendations.   No follow-ups on file.  Future Appointments  Date Time Provider Department Center  08/05/2023  3:00 PM [facility] PROVIDER 1 [facility]  08/05/2023  3:30 PM [facility] US5 [facility]  08/09/2023  9:30 AM Teague Teodoro Feller, PA-C [facility]  08/12/2023  3:00 PM [facility] NURSE [facility]  08/12/2023  3:15 PM [facility] NST [facility]  08/19/2023  1:30 PM Rik Chasten, MD [facility]  08/19/2023  3:00 PM [facility] NURSE [facility]  08/19/2023  3:15 PM [facility] NST [facility]  08/27/2023  3:00 PM [facility] NURSE [facility]  08/27/2023  3:15 PM [facility] NST [facility]  09/02/2023  3:00 PM [facility] PROVIDER 1 [facility]  09/02/2023  3:30 PM [facility] US5 [facility]    Almond Jaffe, NP

## 2023-08-05 ENCOUNTER — Ambulatory Visit (HOSPITAL_BASED_OUTPATIENT_CLINIC_OR_DEPARTMENT_OTHER): Admitting: Obstetrics

## 2023-08-05 ENCOUNTER — Ambulatory Visit: Attending: Obstetrics and Gynecology

## 2023-08-05 VITALS — BP 122/72 | HR 74

## 2023-08-05 DIAGNOSIS — O10013 Pre-existing essential hypertension complicating pregnancy, third trimester: Secondary | ICD-10-CM | POA: Insufficient documentation

## 2023-08-05 DIAGNOSIS — O09522 Supervision of elderly multigravida, second trimester: Secondary | ICD-10-CM | POA: Insufficient documentation

## 2023-08-05 DIAGNOSIS — O09292 Supervision of pregnancy with other poor reproductive or obstetric history, second trimester: Secondary | ICD-10-CM | POA: Diagnosis not present

## 2023-08-05 DIAGNOSIS — Z3A32 32 weeks gestation of pregnancy: Secondary | ICD-10-CM | POA: Diagnosis not present

## 2023-08-05 DIAGNOSIS — O26899 Other specified pregnancy related conditions, unspecified trimester: Secondary | ICD-10-CM | POA: Insufficient documentation

## 2023-08-05 DIAGNOSIS — Z8759 Personal history of other complications of pregnancy, childbirth and the puerperium: Secondary | ICD-10-CM | POA: Diagnosis not present

## 2023-08-05 DIAGNOSIS — O10012 Pre-existing essential hypertension complicating pregnancy, second trimester: Secondary | ICD-10-CM

## 2023-08-05 DIAGNOSIS — O361920 Maternal care for other isoimmunization, second trimester, not applicable or unspecified: Secondary | ICD-10-CM

## 2023-08-05 DIAGNOSIS — O10919 Unspecified pre-existing hypertension complicating pregnancy, unspecified trimester: Secondary | ICD-10-CM | POA: Diagnosis present

## 2023-08-05 DIAGNOSIS — R519 Headache, unspecified: Secondary | ICD-10-CM | POA: Diagnosis not present

## 2023-08-05 DIAGNOSIS — O09213 Supervision of pregnancy with history of pre-term labor, third trimester: Secondary | ICD-10-CM

## 2023-08-05 DIAGNOSIS — O3663X0 Maternal care for excessive fetal growth, third trimester, not applicable or unspecified: Secondary | ICD-10-CM | POA: Insufficient documentation

## 2023-08-05 DIAGNOSIS — Z3480 Encounter for supervision of other normal pregnancy, unspecified trimester: Secondary | ICD-10-CM

## 2023-08-05 DIAGNOSIS — O09523 Supervision of elderly multigravida, third trimester: Secondary | ICD-10-CM

## 2023-08-05 DIAGNOSIS — O09299 Supervision of pregnancy with other poor reproductive or obstetric history, unspecified trimester: Secondary | ICD-10-CM | POA: Diagnosis present

## 2023-08-05 DIAGNOSIS — O09293 Supervision of pregnancy with other poor reproductive or obstetric history, third trimester: Secondary | ICD-10-CM | POA: Insufficient documentation

## 2023-08-05 DIAGNOSIS — O10913 Unspecified pre-existing hypertension complicating pregnancy, third trimester: Secondary | ICD-10-CM | POA: Insufficient documentation

## 2023-08-05 NOTE — Progress Notes (Signed)
 MFM Consult Note  Rebekah Smith is currently at 32 weeks and 1 day.  She has been followed due to advanced maternal age (38 years old) and chronic hypertension treated with labetalol .  Her blood pressure today was 122/72.    A large for gestational age fetus was noted during her prior ultrasound exams.  She has screened negative for gestational diabetes in her current pregnancy.  On today's exam, the overall EFW of 5 pounds 4 ounces measures at the 94th percentile for her gestational age.    There was normal amniotic fluid noted with a total AFI of 12.33 cm.    A BPP performed today was 8 out of 8.    Due to chronic hypertension, we will continue to follow her with weekly fetal testing until delivery.    Should a large for gestational age fetus continue to be noted and her blood pressures remain elevated, delivery may be considered at around 37 weeks.  She will return in 1 week for an NST.    The patient stated that all of her questions were answered today.  A total of 20 minutes was spent counseling and coordinating the care for this patient.  Greater than 50% of the time was spent in direct face-to-face contact.

## 2023-08-07 ENCOUNTER — Encounter: Payer: Self-pay | Admitting: Obstetrics & Gynecology

## 2023-08-09 ENCOUNTER — Ambulatory Visit: Admitting: Physician Assistant

## 2023-08-09 ENCOUNTER — Encounter: Payer: Self-pay | Admitting: Physician Assistant

## 2023-08-09 VITALS — BP 120/80 | HR 87

## 2023-08-09 DIAGNOSIS — R519 Headache, unspecified: Secondary | ICD-10-CM | POA: Diagnosis not present

## 2023-08-09 DIAGNOSIS — Z3A32 32 weeks gestation of pregnancy: Secondary | ICD-10-CM

## 2023-08-09 DIAGNOSIS — G43009 Migraine without aura, not intractable, without status migrainosus: Secondary | ICD-10-CM

## 2023-08-09 DIAGNOSIS — O26893 Other specified pregnancy related conditions, third trimester: Secondary | ICD-10-CM | POA: Diagnosis not present

## 2023-08-09 DIAGNOSIS — O26899 Other specified pregnancy related conditions, unspecified trimester: Secondary | ICD-10-CM

## 2023-08-09 DIAGNOSIS — O10913 Unspecified pre-existing hypertension complicating pregnancy, third trimester: Secondary | ICD-10-CM | POA: Diagnosis not present

## 2023-08-09 DIAGNOSIS — O10919 Unspecified pre-existing hypertension complicating pregnancy, unspecified trimester: Secondary | ICD-10-CM

## 2023-08-09 MED ORDER — BUTALBITAL-APAP-CAFFEINE 50-325-40 MG PO TABS: 1.0000 | ORAL_TABLET | Freq: Four times a day (QID) | ORAL | 1 refills | Status: DC | PRN

## 2023-08-09 MED ORDER — CYCLOBENZAPRINE HCL 10 MG PO TABS: 10.0000 mg | ORAL_TABLET | Freq: Three times a day (TID) | ORAL | 1 refills | Status: DC | PRN

## 2023-08-09 NOTE — Progress Notes (Signed)
 Medication Samples have been provided to the patient.  Drug name: Nurtec       Strength: 75mg         Qty: 2  LOT: [id number]  Exp.Date: 11/27  Dosing instructions: TO BE USED AFTER PREGNANCY   The patient has been instructed regarding the correct time, dose, and frequency of taking this medication, including desired effects and most common side effects.   Shannan Dart 10:33 AM 08/09/2023

## 2023-08-09 NOTE — Progress Notes (Signed)
 New Headache :   No fm hx of consistent headaches   Visual disturbances   No nausea/ vomiting   Ibuprofen  prior to pregnancy  History:  Rebekah Smith is a 38 y.o. [id number] at [redacted] weeks GA who presents to clinic today for new headache eval.  She kinda always has a lingering headache throughout this pregnancy. She had headaches before this pregnancy but notes they were less frequent.  They were related to her menstrual cycle.   She has moderate to severe pain, throbbing, worse with movement, lasting all day, sensitivity to lights/noises, blurry vision and seeing light spots.  She does not have vision changes prior to the headache.   There is no vomiting or nausea.  She also experiences tenderness in the back of the head/neck and shoulders. 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  She has 4 sons and one on the way.  She is planning to have no more kids after this one.   She is often busy with mom/household duties in the evening and weekends.  She admits she puts herself last.    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  01/10/16 Titer 4  [x]  MFM referral- serial MCA doppler  FOB is negative so no further testing is needed (pregnancy 2023--[name])   Migraines    Pregnancy induced hypertension     Social History   Socioeconomic History   Marital status: Divorced    Spouse name: Not on file   Number of children: Not on file   Years of education: Not on file   Highest education level: Not  on file  Occupational History   Occupation: Actuary    Employer: [employer]  Tobacco Use   Smoking status: Never   Smokeless tobacco: Never  Vaping Use   Vaping status: Never Used  Substance and Sexual Activity   Alcohol use: No   Drug use: No   Sexual activity: Yes    Partners: Male    Birth control/protection: None  Other Topics Concern   Not on file  Social History Narrative   Not on file   Social Drivers of Health   Financial Resource Strain: Low Risk  (04/23/2022)   Received from [facility]   Overall Financial Resource Strain (CARDIA)    Difficulty of Paying Living Expenses: Not hard at all  Food Insecurity: No Food Insecurity (07/01/2023)   Hunger Vital Sign    Worried About Running Out of Food in the Last Year: Never true    Ran Out of Food in the Last Year: Never true  Transportation Needs: No Transportation Needs (07/01/2023)   PRAPARE - Administrator, Civil Service (Medical): No    Lack of Transportation (Non-Medical): No  Physical Activity: Not on file  Stress: Not on file  Social Connections: Unknown (07/31/2021)   Received from [facility], [facility]   Social Network  Social Network: Not on file  Intimate Partner Violence: Not At Risk (07/01/2023)   Humiliation, Afraid, Rape, and Kick questionnaire    Fear of Current or Ex-Partner: No    Emotionally Abused: No    Physically Abused: No    Sexually Abused: No    Family History  Problem Relation Age of Onset   Anxiety disorder Mother    Hypertension Mother    Heart murmur Mother    Breast cancer Maternal Aunt    Breast cancer Paternal Grandmother    Cervical cancer Maternal Aunt    Stroke Maternal Grandmother     No Known Allergies  Current Outpatient Medications on File Prior to Visit  Medication Sig Dispense Refill   Aspirin  81 MG CAPS Take 162 mg by mouth daily. 60 capsule 8   labetalol  (NORMODYNE ) 200 MG tablet Take 200 mg by mouth 2 (two)  times daily.     Prenatal Vit-Fe Fumarate-FA (PRENATAL VITAMINS PO) Take by mouth.     metoCLOPramide  (REGLAN ) 5 MG tablet Take 1 tablet (5 mg total) by mouth every 8 (eight) hours as needed (migraine/headache). (Patient not taking: Reported on 08/09/2023) 30 tablet 0   prochlorperazine  (COMPAZINE ) 10 MG tablet Take 1 tablet (10 mg total) by mouth every 6 (six) hours as needed (headaches). (Patient not taking: Reported on 08/09/2023) 30 tablet 1   No current facility-administered medications on file prior to visit.     Review of Systems:  All pertinent positive/negative included in HPI, all other review of systems are negative   Objective:  Physical Exam BP 120/80   Pulse 87   LMP 12/02/2022 (Approximate)  CONSTITUTIONAL: Well-developed, well-nourished female in no acute distress.  EYES: EOM intact ENT: Normocephalic CARDIOVASCULAR: Regular rate  RESPIRATORY: Normal rate.  MUSCULOSKELETAL: Normal ROM SKIN: Warm, dry without erythema  NEUROLOGICAL: Alert, oriented, CN II-XII grossly intact, Appropriate balance PSYCH: Normal behavior, mood   Assessment & Plan:  Assessment: 1. Migraine without aura and without status migrainosus, not intractable   2. Headache in pregnancy, antepartum   3. Chronic hypertension affecting pregnancy        Plan: Pt will talk with OB regarding use of magnesium  which can help with headache prevention.  She is nervous about using this related to her high risk conditions/past experience. Great BP today Flexeril  - 1/2 to 1 tab up to three times daily as tolerated for headache or muscle spasm.  Watch for sedation.  Fioricet  - use for breakthrough headache or when she needs something that won't make her sleepy.  Limit to 2 days per week to avoid dependence.  Do not use with additional caffeine  or tylenol .   Healthy routines and habits encouraged.  Biofreeze may be helpful.  Ice is encouraged along with drinking extra water.  Pt encouraged to keep with beta  blocker for htn as able - as this class often helps prevent migraine.  Nurtec sample given for after pregnancy.   Follow-up PRN  81 minutes spent with patient and her care this encounter  Jeanett Miles, PA-C 08/09/2023 9:42 AM

## 2023-08-09 NOTE — Patient Instructions (Signed)
 Migraine Headache A migraine headache is a very strong throbbing pain on one or both sides of your head. This type of headache can also cause other symptoms. It can last from 4 hours to 3 days. Talk with your doctor about what things may bring on (trigger) this condition. What are the causes? The exact cause of a migraine is not known. This condition may be brought on or caused by: Smoking. Medicines, such as: Medicine used to treat chest pain (nitroglycerin). Birth control pills. Estrogen. Some blood pressure medicines. Certain substances in some foods or drinks. Foods and drinks, such as: Cheese. Chocolate. Alcohol. Caffeine. Doing physical activity that is very hard. Other things that may trigger a migraine headache include: Periods. Pregnancy. Hunger. Stress. Getting too much or too little sleep. Weather changes. Feeling tired (fatigue). What increases the risk? Being 18-65 years old. Being female. Having a family history of migraine headaches. Being Caucasian. Having a mental health condition, such as being sad (depressed) or feeling worried or nervous (anxious). Being very overweight (obese). What are the signs or symptoms? A throbbing pain. This pain may: Happen in any area of the head, such as on one or both sides. Make it hard to do daily activities. Get worse with physical activity. Get worse around bright lights, loud noises, or smells. Other symptoms may include: Feeling like you may vomit (nauseous). Vomiting. Dizziness. Before a migraine headache starts, you may get warning signs (an aura). An aura may include: Seeing flashing lights or having blind spots. Seeing bright spots, halos, or zigzag lines. Having tunnel vision or blurred vision. Having numbness or a tingling feeling. Having trouble talking. Having weak muscles. After a migraine ends, you may have symptoms. These may include: Tiredness. Trouble thinking (concentrating). How is this  treated? Taking medicines that: Relieve pain. Relieve the feeling like you may vomit. Prevent migraine headaches. Treatment may also include: Acupuncture. Lifestyle changes like avoiding foods that bring on migraine headaches. Learning ways to control your body functions (biofeedback). Therapy to help you know and deal with negative thoughts (cognitive behavioral therapy). Follow these instructions at home: Medicines Take over-the-counter and prescription medicines only as told by your doctor. If told, take steps to prevent problems with pooping (constipation). You may need to: Drink enough fluid to keep your pee (urine) pale yellow. Take medicines. You will be told what medicines to take. Eat foods that are high in fiber. These include beans, whole grains, and fresh fruits and vegetables. Limit foods that are high in fat and sugar. These include fried or sweet foods. Ask your doctor if you should avoid driving or using machines while you are taking your medicine. Lifestyle  Do not drink alcohol. Do not smoke or use any products that contain nicotine or tobacco. If you need help quitting, ask your doctor. Get 7-9 hours of sleep each night, or the amount recommended by your doctor. Find ways to deal with stress, such as meditation, deep breathing, or yoga. Try to exercise often. This can help lessen how bad and how often your migraines happen. General instructions Keep a journal to find out what may bring on your migraine headaches. This can help you avoid those things. For example, write down: What you eat and drink. How much sleep you get. Any change to your medicines or diet. If you have a migraine headache: Avoid things that make your symptoms worse, such as bright lights. Lie down in a dark, quiet room. Do not drive or use machinery. Ask your  doctor what activities are safe for you. Where to find more information Coalition for Headache and Migraine Patients (CHAMP):  headachemigraine.org American Migraine Foundation: americanmigrainefoundation.org National Headache Foundation: headaches.org Contact a doctor if: You get a migraine headache that is different or worse than others you have had. You have more than 15 days of headaches in one month. Get help right away if: Your migraine headache gets very bad. Your migraine headache lasts more than 72 hours. You have a fever or stiff neck. You have trouble seeing. Your muscles feel weak or like you cannot control them. You lose your balance a lot. You have trouble walking. You faint. You have a seizure. This information is not intended to replace advice given to you by your health care provider. Make sure you discuss any questions you have with your health care provider. Document Revised: 11/13/2021 Document Reviewed: 11/13/2021 Elsevier Patient Education  2024 [employer].

## 2023-08-12 ENCOUNTER — Ambulatory Visit

## 2023-08-16 ENCOUNTER — Encounter: Payer: Self-pay | Admitting: Obstetrics & Gynecology

## 2023-08-16 ENCOUNTER — Encounter: Payer: Self-pay | Admitting: Obstetrics and Gynecology

## 2023-08-19 ENCOUNTER — Inpatient Hospital Stay (HOSPITAL_COMMUNITY)
Admission: AD | Admit: 2023-08-19 | Discharge: 2023-08-19 | Disposition: A | Discharge status: Home / Self Care | Attending: Obstetrics and Gynecology | Admitting: Obstetrics and Gynecology

## 2023-08-19 ENCOUNTER — Encounter (HOSPITAL_COMMUNITY): Payer: Self-pay | Admitting: Obstetrics and Gynecology

## 2023-08-19 ENCOUNTER — Other Ambulatory Visit: Payer: Self-pay

## 2023-08-19 ENCOUNTER — Ambulatory Visit

## 2023-08-19 ENCOUNTER — Other Ambulatory Visit

## 2023-08-19 ENCOUNTER — Ambulatory Visit (INDEPENDENT_AMBULATORY_CARE_PROVIDER_SITE_OTHER): Admitting: Obstetrics & Gynecology

## 2023-08-19 VITALS — BP 147/93 | HR 91 | Wt 212.0 lb

## 2023-08-19 DIAGNOSIS — Z3A34 34 weeks gestation of pregnancy: Secondary | ICD-10-CM | POA: Diagnosis not present

## 2023-08-19 DIAGNOSIS — O10919 Unspecified pre-existing hypertension complicating pregnancy, unspecified trimester: Secondary | ICD-10-CM | POA: Diagnosis not present

## 2023-08-19 DIAGNOSIS — O09299 Supervision of pregnancy with other poor reproductive or obstetric history, unspecified trimester: Secondary | ICD-10-CM | POA: Diagnosis not present

## 2023-08-19 DIAGNOSIS — Z79899 Other long term (current) drug therapy: Secondary | ICD-10-CM | POA: Insufficient documentation

## 2023-08-19 DIAGNOSIS — Z3480 Encounter for supervision of other normal pregnancy, unspecified trimester: Secondary | ICD-10-CM

## 2023-08-19 DIAGNOSIS — O10013 Pre-existing essential hypertension complicating pregnancy, third trimester: Secondary | ICD-10-CM | POA: Insufficient documentation

## 2023-08-19 DIAGNOSIS — O10913 Unspecified pre-existing hypertension complicating pregnancy, third trimester: Secondary | ICD-10-CM

## 2023-08-19 LAB — COMPREHENSIVE METABOLIC PANEL WITH GFR
ALT: 14 U/L (ref 0–44)
AST: 23 U/L (ref 15–41)
Albumin: 2.9 g/dL — ABNORMAL LOW (ref 3.5–5.0)
Alkaline Phosphatase: 57 U/L (ref 38–126)
Anion gap: 6 (ref 5–15)
BUN: 6 mg/dL (ref 6–20)
CO2: 22 mmol/L (ref 22–32)
Calcium: 8.6 mg/dL — ABNORMAL LOW (ref 8.9–10.3)
Chloride: 109 mmol/L (ref 98–111)
Creatinine, Ser: 0.56 mg/dL (ref 0.44–1.00)
GFR, Estimated: >60 mL/min (ref >60)
Glucose, Bld: 83 mg/dL (ref 70–99)
Potassium: 3.7 mmol/L (ref 3.5–5.1)
Sodium: 137 mmol/L (ref 135–145)
Total Bilirubin: 0.5 mg/dL (ref 0.0–1.2)
Total Protein: 5.7 g/dL — ABNORMAL LOW (ref 6.5–8.1)

## 2023-08-19 LAB — CBC
HCT: 34.3 % — ABNORMAL LOW (ref 36.0–46.0)
Hemoglobin: 11.7 g/dL — ABNORMAL LOW (ref 12.0–15.0)
MCH: 30.7 pg (ref 26.0–34.0)
MCHC: 34.1 g/dL (ref 30.0–36.0)
MCV: 90 fL (ref 80.0–100.0)
Platelets: 166 10*3/uL (ref 150–400)
RBC: 3.81 MIL/uL — ABNORMAL LOW (ref 3.87–5.11)
RDW: 13.7 % (ref 11.5–15.5)
WBC: 6.1 10*3/uL (ref 4.0–10.5)
nRBC: 0 % (ref 0.0–0.2)

## 2023-08-19 LAB — PROTEIN / CREATININE RATIO, URINE
Creatinine, Urine: 70 mg/dL
Protein Creatinine Ratio: 0.14 mg/mg{creat} (ref 0.00–0.15)
Total Protein, Urine: 10 mg/dL

## 2023-08-19 MED ORDER — HYDRALAZINE HCL 20 MG/ML IJ SOLN: 5.0000 mg | INTRAMUSCULAR | Status: DC | PRN

## 2023-08-19 MED ORDER — LABETALOL HCL 5 MG/ML IV SOLN: 40.0000 mg | INTRAVENOUS | Status: DC | PRN

## 2023-08-19 MED ORDER — LABETALOL HCL 300 MG PO TABS: 300.0000 mg | ORAL_TABLET | Freq: Three times a day (TID) | ORAL | 3 refills | Status: DC

## 2023-08-19 MED ORDER — LABETALOL HCL 5 MG/ML IV SOLN: 20.0000 mg | INTRAVENOUS | Status: DC | PRN

## 2023-08-19 MED ORDER — LABETALOL HCL 100 MG PO TABS
200.0000 mg | ORAL_TABLET | Freq: Once | ORAL | Status: AC
  Administered 2023-08-19: 200 mg via ORAL
  Filled 2023-08-19: qty 2

## 2023-08-19 MED ORDER — HYDRALAZINE HCL 20 MG/ML IJ SOLN: 10.0000 mg | INTRAMUSCULAR | Status: DC | PRN

## 2023-08-19 NOTE — MAU Note (Signed)
 Rebekah Smith is a 38 y.o. at [redacted]w[redacted]d here in MAU reporting: sent from office for BP evaluation, with c/o upper abd/chest pain right sided, HA, and increased BP in office. Pt last took labetolol today at 0900 due a dose now. NO c/o SROM, vaginal bleeding, contractions, decreased FM, or visual disturbances. EFM explained and applied to soft non tender abd. Physical begun.   LMP: na Onset of complaint: this am Pain score: 6/10 Vitals:   08/19/23 1501  BP: (!) 130/98  Pulse: 87  Resp: 16  Temp: 98.1 F (36.7 C)  SpO2: 100%     FHT: 140  Lab orders placed from triage: urine

## 2023-08-19 NOTE — Progress Notes (Signed)
    PRENATAL VISIT NOTE  Subjective:  Rebekah Smith is a 38 y.o. [phone] at [redacted]w[redacted]d being seen today for ongoing prenatal care.  She is currently monitored for the following issues for this high-risk pregnancy and has History of postpartum hemorrhage; History of pre-eclampsia in prior pregnancy, currently pregnant; Chronic hypertension affecting pregnancy; Kell isoimmunization during pregnancy; ASCUS with positive high risk HPV cervical; Family history of breast cancer; Supervision of high risk pregnancy; AMA (advanced maternal age) multigravida 35+, second trimester; Hx PTD X 2, [redacted]w[redacted]d, [redacted]w[redacted]d; Hernia, umbilical; Headache in pregnancy, antepartum; Excessive fetal growth affecting management of mother in third trimester, antepartum; and Migraine without aura and without status migrainosus, not intractable on their problem list.  Patient reports headache, hand swelling, blurry vision, RUQ pain. Contractions: Irregular. Vag. Bleeding: None.  Movement: Present. Denies leaking of fluid.   The following portions of the patient's history were reviewed and updated as appropriate: allergies, current medications, past family history, past medical history, past social history, past surgical history and problem list.   Objective:    Vitals:   08/19/23 1331 08/19/23 1335  BP: (!) 153/95 (!) 147/93  Pulse: 89 91  Weight: 212 lb (96.2 kg)     Fetal Status:  Fetal Heart Rate (bpm): 131   Movement: Present    General: Alert, oriented and cooperative. Patient is in no acute distress.  Skin: Skin is warm and dry. No rash noted.   Cardiovascular: Normal heart rate noted  Respiratory: Normal respiratory effort, no problems with respiration noted  Abdomen: Soft, gravid, appropriate for gestational age.  Pain/Pressure: Present (noticed pain under right breast x 1 day, comes and goes)     Pelvic: Cervical exam deferred        Extremities: Normal range of motion.  Edema: Trace  Mental Status: Normal mood and  affect. Normal behavior. Normal judgment and thought content.   Assessment and Plan:  Pregnancy: [id number] at [redacted]w[redacted]d 1. Supervision of high risk pregnancy (Primary) Fundal height 35  2. Chronic hypertension affecting pregnancy BP elevated with headache, RUQ pain, hand swelling MAU notified that she is coming for evaluation.  Needs BPPP--unable to do at [facility] or MFM this week.  If she does not have Pre E; will need BP meds adjusted prior to discharge.   3. History of pre-eclampsia in prior pregnancy, currently pregnant   Preterm labor symptoms and general obstetric precautions including but not limited to vaginal bleeding, contractions, leaking of fluid and fetal movement were reviewed in detail with the patient. Please refer to After Visit Summary for other counseling recommendations.   No follow-ups on file.  Future Appointments  Date Time Provider Department Center  08/19/2023  3:00 PM [facility] NURSE [facility]  08/19/2023  3:15 PM [facility] NST [facility]  08/27/2023  3:00 PM [facility] NURSE [facility]  08/27/2023  3:15 PM [facility] NST [facility]  09/02/2023  3:00 PM [facility] PROVIDER 1 [facility]  09/02/2023  3:30 PM [facility] US5 [facility]    Glorietta Lark, MD

## 2023-08-19 NOTE — Discharge Instructions (Signed)
 Reasons to return to [facility]U at [facility]: Preeclampsia Your blood pressure is >160/110. OR your blood pressure is >140/90 the first time you check it and stays that high when you recheck 15 minutes later. You have a headache that will not go away after having something to eat, something to drink, and Tylenol . You have changes in your vision or upper abdominal pain that does not get better with Tylenol .  Labor/Preterm Labor You begin to have strong, frequent contractions 5 minutes apart or less, each last 1 minute, these have been going on for 1-2 hours, and you cannot walk or talk during them Your water breaks.  Sometimes it is a big gush of fluid, sometimes it is just a trickle that keeps getting your panties wet or running down your legs You have vaginal bleeding.  It is normal to have a small amount of spotting if your cervix was checked.  You don't feel your baby moving like normal.  If you don't, get you something to eat and drink and lay down and focus on feeling your baby move. If your baby is still not moving like normal, you should call your OB or go to the MAU.

## 2023-08-19 NOTE — MAU Provider Note (Signed)
 Chief Complaint:  Hypertension, Headache, and upper abd pain (Right side)   HPI   None     Rebekah Smith is a 38 y.o. [id number] at [redacted]w[redacted]d who presents to maternity admissions for preeclampsia workup. Associated symptoms include headache, visual disturbances, and epigastric/RUQ pain. Patient does have a history of migraines and is unsure if headache/visual disturbances are migraine or HTN related. RUQ pain started yesterday, described as a constant irritation but not a sharp or burning pain. She has a history of AMA and previous pregnancy with preeclampsia (x2) that increases the risk of preeclampsia.   Additional history obtained from partner  Pregnancy Course: Receives care at [facility]. Prenatal records reviewed. Pregnancy complicated by AMA, hx preterm delivery x2, gestational HTN.  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  01/10/16 Titer 4  [x]  MFM referral- serial MCA doppler  FOB is negative so no further testing is needed (pregnancy 2023--[name])   Migraines    Pregnancy induced hypertension    OB History  Gravida Para Term Preterm AB Living  8 4 2 2 3 4   SAB IAB Ectopic Multiple Live Births  3   0 4    # Outcome Date GA Lbr Len/2nd Weight Sex Type Anes PTL Lv  8 Current           7 SAB 08/27/22 [redacted]w[redacted]d         6 Preterm 07/14/16 [redacted]w[redacted]d 21:04 / 00:15 3300 g M Vag-Spont EPI  LIV     Birth Comments: PPH  5 Preterm 04/30/14 [redacted]w[redacted]d / 00:14 2781 g M Vag-Spont EPI  LIV     Birth Comments: within normal limits  4 SAB 06/09/13          3 Term 09/01/10 [redacted]w[redacted]d  4338 g M Vag-Spont EPI N LIV     Birth Comments: PPH  2 Term 12/03/07  [redacted]w[redacted]d  3884 g M Vag-Spont EPI N LIV  1 SAB            Past Surgical History:  Procedure Laterality Date   TONSILLECTOMY     Family History  Problem Relation Age of Onset   Anxiety disorder Mother    Hypertension Mother    Heart murmur Mother    Breast cancer Maternal Aunt    Breast cancer Paternal Grandmother    Cervical cancer Maternal Aunt    Stroke Maternal Grandmother    Social History   Tobacco Use   Smoking status: Never   Smokeless tobacco: Never  Vaping Use   Vaping status: Never Used  Substance Use Topics   Alcohol use: No   Drug use: No   No Known Allergies Medications Prior to Admission  Medication Sig Dispense Refill Last Dose/Taking   Aspirin  81 MG CAPS Take 162 mg by mouth daily. 60 capsule 8 08/18/2023   labetalol  (NORMODYNE ) 200 MG tablet Take 200 mg by mouth 3 (three) times daily.   08/19/2023 at  9:00 AM   Prenatal Vit-Fe Fumarate-FA (PRENATAL VITAMINS PO) Take by mouth.   08/18/2023   butalbital -acetaminophen -caffeine  (FIORICET ) 50-325-40 MG tablet Take 1-2 tablets by mouth every 6 (  six) hours as needed for headache. (Patient not taking: Reported on 08/19/2023) 20 tablet 1    cyclobenzaprine  (FLEXERIL ) 10 MG tablet Take 1 tablet (10 mg total) by mouth every 8 (eight) hours as needed for muscle spasms. (Patient not taking: Reported on 08/19/2023) 30 tablet 1    metoCLOPramide  (REGLAN ) 5 MG tablet Take 1 tablet (5 mg total) by mouth every 8 (eight) hours as needed (migraine/headache). (Patient not taking: Reported on 07/30/2023) 30 tablet 0    prochlorperazine  (COMPAZINE ) 10 MG tablet Take 1 tablet (10 mg total) by mouth every 6 (six) hours as needed (headaches). (Patient not taking: Reported on 07/30/2023) 30 tablet 1     I have reviewed patient's Past Medical Hx, Surgical Hx, Family Hx, Social Hx, medications and allergies.   ROS  Pertinent items noted in HPI and remainder of comprehensive ROS otherwise negative.   PHYSICAL EXAM  Patient Vitals for the past  24 hrs:  BP Temp Temp src Pulse Resp SpO2 Height Weight  08/19/23 1630 130/88 -- -- 90 16 98 % -- --  08/19/23 1545 127/80 -- -- 82 -- 97 % -- --  08/19/23 1530 (!) 132/91 -- -- (!) 147 16 99 % -- --  08/19/23 1501 (!) 130/98 98.1 F (36.7 C) Oral 87 16 100 % 5\' 7"  (1.702 m) 90.7 kg    Constitutional: Well-developed, well-nourished female in no acute distress.  HEENT: atraumatic, normocephalic. Neck has normal ROM. EOM intact. Cardiovascular: normal rate & rhythm, warm and well-perfused Respiratory: normal effort, no problems with respiration noted GI: Abd soft, non-tender, non-distended MSK: Extremities nontender, no edema, normal ROM Skin: warm and dry. Acyanotic, no jaundice or pallor. Neurologic: Alert and oriented x 4. No abnormal coordination. Psychiatric: Normal mood. Speech not slurred, not rapid/pressured. Patient is cooperative. GU: no CVA tenderness    Fetal Tracing: Baseline FHR: 135 per minute Fetal heart variability: moderate Fetal Heart Rate accelerations: yes Fetal Heart Rate decelerations: none Fetal Non-stress Test: reactive Toco: occasional uterine contractions  Labs: Results for orders placed or performed during the hospital encounter of 08/19/23 (from the past 24 hours)  CBC     Status: Abnormal   Collection Time: 08/19/23  3:09 PM  Result Value Ref Range   WBC 6.1 4.0 - 10.5 K/uL   RBC 3.81 (L) 3.87 - 5.11 MIL/uL   Hemoglobin 11.7 (L) 12.0 - 15.0 g/dL   HCT 19.1 (L) 47.8 - 29.5 %   MCV 90.0 80.0 - 100.0 fL   MCH 30.7 26.0 - 34.0 pg   MCHC 34.1 30.0 - 36.0 g/dL   RDW 62.1 30.8 - 65.7 %   Platelets 166 150 - 400 K/uL   nRBC 0.0 0.0 - 0.2 %  Comprehensive metabolic panel with GFR     Status: Abnormal   Collection Time: 08/19/23  3:09 PM  Result Value Ref Range   Sodium 137 135 - 145 mmol/L   Potassium 3.7 3.5 - 5.1 mmol/L   Chloride 109 98 - 111 mmol/L   CO2 22 22 - 32 mmol/L   Glucose, Bld 83 70 - 99 mg/dL   BUN 6 6 - 20 mg/dL   Creatinine, Ser  8.46 0.44 - 1.00 mg/dL   Calcium  8.6 (L) 8.9 - 10.3 mg/dL   Total Protein 5.7 (L) 6.5 - 8.1 g/dL   Albumin 2.9 (L) 3.5 - 5.0 g/dL   AST 23 15 - 41 U/L   ALT 14 0 - 44 U/L   Alkaline Phosphatase 57  38 - 126 U/L   Total Bilirubin 0.5 0.0 - 1.2 mg/dL   GFR, Estimated >40 >98 mL/min   Anion gap 6 5 - 15  Protein / creatinine ratio, urine     Status: None   Collection Time: 08/19/23  3:10 PM  Result Value Ref Range   Creatinine, Urine 70 mg/dL   Total Protein, Urine 10 mg/dL   Protein Creatinine Ratio 0.14 0.00 - 0.15 mg/mg[Cre]    Imaging:  No results found.  MDM & MAU COURSE  MDM: High  MAU Course: -BP elevated, otherwise vital signs within normal limits.  -CBC, CMP, urine protein/creatinine ratio for preeclampsia rule out. -Preeclampsia labs within normal limits. No LFT abnormalities, platelets within normal limits. -BP has remained <130/90.  Differential diagnosis considered for upper abdominal pain includes but is not limited to: cholecystitis, biliary colic, preeclampsia, constipation, costochondritis  Orders Placed This Encounter  Procedures   CBC   Comprehensive metabolic panel with GFR   Protein / creatinine ratio, urine   Vital signs   Notify physician (specify) Confirmatory reading of BP> 160/110 15 minutes later   Apply Hypertensive Disorders of Pregnancy Care Plan   Measure blood pressure   Discharge patient   Seizure precautions   Meds ordered this encounter  Medications   AND Linked Order Group    hydrALAZINE  (APRESOLINE ) injection 5 mg    hydrALAZINE  (APRESOLINE ) injection 10 mg    labetalol  (NORMODYNE ) injection 20 mg    labetalol  (NORMODYNE ) injection 40 mg   labetalol  (NORMODYNE ) tablet 200 mg   labetalol  (NORMODYNE ) 300 MG tablet    Sig: Take 1 tablet (300 mg total) by mouth 3 (three) times daily.    Dispense:  90 tablet    Refill:  3    ASSESSMENT   1. Chronic hypertension affecting pregnancy   2. Chronic hypertension with exacerbation  during pregnancy in third trimester   3. [redacted] weeks gestation of pregnancy     PLAN  Discharge home in stable condition with return precautions.  Increase labetalol  to 300 mg three times daily. Continue ambulatory BP monitoring.     Allergies as of 08/19/2023   No Known Allergies      Medication List     STOP taking these medications    butalbital -acetaminophen -caffeine  50-325-40 MG tablet Commonly known as: FIORICET        TAKE these medications    Aspirin  81 MG Caps Take 162 mg by mouth daily.   cyclobenzaprine  10 MG tablet Commonly known as: FLEXERIL  Take 1 tablet (10 mg total) by mouth every 8 (eight) hours as needed for muscle spasms.   labetalol  300 MG tablet Commonly known as: NORMODYNE  Take 1 tablet (300 mg total) by mouth 3 (three) times daily. What changed:  medication strength how much to take   metoCLOPramide  5 MG tablet Commonly known as: Reglan  Take 1 tablet (5 mg total) by mouth every 8 (eight) hours as needed (migraine/headache).   PRENATAL VITAMINS PO Take by mouth.   prochlorperazine  10 MG tablet Commonly known as: COMPAZINE  Take 1 tablet (10 mg total) by mouth every 6 (six) hours as needed (headaches).        Noreene Bearded, PA

## 2023-08-19 NOTE — Progress Notes (Addendum)
 Pt reported headache, blurred vision and pain under right breast x 1 day. Blood pressures elevated at OB visit today, provider notified.   Evelia Hipp, RN

## 2023-08-20 ENCOUNTER — Telehealth: Payer: Self-pay | Admitting: *Deleted

## 2023-08-20 NOTE — Telephone Encounter (Signed)
 Returned call from 10:00 AM. Left patient a message to call and schedule next appointment.

## 2023-08-23 ENCOUNTER — Inpatient Hospital Stay (HOSPITAL_COMMUNITY)
Admission: AD | Admit: 2023-08-23 | Discharge: 2023-08-23 | Disposition: A | Discharge status: Home / Self Care | Payer: Self-pay | Attending: Family Medicine | Admitting: Family Medicine

## 2023-08-23 ENCOUNTER — Encounter (HOSPITAL_COMMUNITY): Payer: Self-pay | Admitting: Family Medicine

## 2023-08-23 ENCOUNTER — Encounter: Payer: Self-pay | Admitting: Obstetrics and Gynecology

## 2023-08-23 ENCOUNTER — Telehealth: Payer: Self-pay

## 2023-08-23 DIAGNOSIS — O09293 Supervision of pregnancy with other poor reproductive or obstetric history, third trimester: Secondary | ICD-10-CM | POA: Insufficient documentation

## 2023-08-23 DIAGNOSIS — O10013 Pre-existing essential hypertension complicating pregnancy, third trimester: Secondary | ICD-10-CM | POA: Insufficient documentation

## 2023-08-23 DIAGNOSIS — G43009 Migraine without aura, not intractable, without status migrainosus: Secondary | ICD-10-CM | POA: Insufficient documentation

## 2023-08-23 DIAGNOSIS — O10913 Unspecified pre-existing hypertension complicating pregnancy, third trimester: Secondary | ICD-10-CM

## 2023-08-23 DIAGNOSIS — Z3A34 34 weeks gestation of pregnancy: Secondary | ICD-10-CM

## 2023-08-23 DIAGNOSIS — O09523 Supervision of elderly multigravida, third trimester: Secondary | ICD-10-CM | POA: Insufficient documentation

## 2023-08-23 DIAGNOSIS — O99353 Diseases of the nervous system complicating pregnancy, third trimester: Secondary | ICD-10-CM | POA: Insufficient documentation

## 2023-08-23 DIAGNOSIS — R030 Elevated blood-pressure reading, without diagnosis of hypertension: Secondary | ICD-10-CM | POA: Diagnosis present

## 2023-08-23 DIAGNOSIS — R519 Headache, unspecified: Secondary | ICD-10-CM | POA: Diagnosis present

## 2023-08-23 DIAGNOSIS — M79661 Pain in right lower leg: Secondary | ICD-10-CM | POA: Diagnosis present

## 2023-08-23 LAB — COMPREHENSIVE METABOLIC PANEL WITH GFR
ALT: 16 U/L (ref 0–44)
AST: 23 U/L (ref 15–41)
Albumin: 2.9 g/dL — ABNORMAL LOW (ref 3.5–5.0)
Alkaline Phosphatase: 64 U/L (ref 38–126)
Anion gap: 12 (ref 5–15)
BUN: <5 mg/dL — ABNORMAL LOW (ref 6–20)
CO2: 22 mmol/L (ref 22–32)
Calcium: 9 mg/dL (ref 8.9–10.3)
Chloride: 104 mmol/L (ref 98–111)
Creatinine, Ser: 0.66 mg/dL (ref 0.44–1.00)
GFR, Estimated: >60 mL/min (ref >60)
Glucose, Bld: 117 mg/dL — ABNORMAL HIGH (ref 70–99)
Potassium: 3.6 mmol/L (ref 3.5–5.1)
Sodium: 138 mmol/L (ref 135–145)
Total Bilirubin: 0.6 mg/dL (ref 0.0–1.2)
Total Protein: 5.6 g/dL — ABNORMAL LOW (ref 6.5–8.1)

## 2023-08-23 LAB — CBC
HCT: 35.3 % — ABNORMAL LOW (ref 36.0–46.0)
Hemoglobin: 12.2 g/dL (ref 12.0–15.0)
MCH: 31.2 pg (ref 26.0–34.0)
MCHC: 34.6 g/dL (ref 30.0–36.0)
MCV: 90.3 fL (ref 80.0–100.0)
Platelets: 162 10*3/uL (ref 150–400)
RBC: 3.91 MIL/uL (ref 3.87–5.11)
RDW: 14.1 % (ref 11.5–15.5)
WBC: 6.8 10*3/uL (ref 4.0–10.5)
nRBC: 0 % (ref 0.0–0.2)

## 2023-08-23 LAB — LIPASE, BLOOD: Lipase: 39 U/L (ref 11–51)

## 2023-08-23 LAB — PROTEIN / CREATININE RATIO, URINE
Creatinine, Urine: 97 mg/dL
Protein Creatinine Ratio: 0.08 mg/mg{creat} (ref 0.00–0.15)
Total Protein, Urine: 8 mg/dL

## 2023-08-23 MED ORDER — LABETALOL HCL 100 MG PO TABS: 100.0000 mg | ORAL_TABLET | Freq: Three times a day (TID) | ORAL | 0 refills | Status: DC

## 2023-08-23 MED ORDER — ACETAMINOPHEN-CAFFEINE 500-65 MG PO TABS
2.0000 | ORAL_TABLET | Freq: Once | ORAL | Status: AC
  Administered 2023-08-23: 2 via ORAL
  Filled 2023-08-23: qty 2

## 2023-08-23 NOTE — MAU Note (Signed)
 MAU Triage Note:  .Rebekah Smith is a 38 y.o. at [redacted]w[redacted]d here in MAU reporting: OB office told her to come in for consistent HBP at home this week, so she came as soon as she was able to get her children settled. Also reports tightness in her right calf, although she denies swelling, redness, or warmth. Denies VB or LOF. Reports +FM.   PIH Assessment: Headache present: Yes ;No treatment attempted Visual disturbances: Blurred RUQ pain/Epigastric: pressure Atypical edema: None  BP Medications: Labetalol  300 mg TID, last dose taken at 1600   Patient complaint: HBP, HA, Pn under Rt Rib, Blurry Vision  Pain Score: 5  Pain Location: Head Pain Score: 5 Pain Location: Abdomen   Onset of complaint: On-going LMP: Patient's last menstrual period was 12/02/2022 (approximate).  Vitals:   08/23/23 2013  BP: (!) 140/91  Pulse: 98  Resp: 17  Temp: 97.9 F (36.6 C)  SpO2: 100%    FHT:  Fetal Heart Rate Mode: Doppler Baseline Rate (A): 144 bpm Lab orders placed from triage: orders in prior to triage

## 2023-08-23 NOTE — Discharge Instructions (Signed)
 Increase labetalol  dosing to 600 mg PO TID

## 2023-08-23 NOTE — MAU Provider Note (Addendum)
 Chief Complaint:  Hypertension and Headache   HPI   None     Rebekah Smith is a 38 y.o. [id number] at [redacted]w[redacted]d who presents to maternity admissions for elevated blood pressures.  She called her office this morning reporting blood pressures consistently over 140/90 at home this week.  At the time, denied current headache, RUQ pain.  She did endorse hands/feet swelling, blurry vision which was unchanged from previously.  OB recommended MAU evaluation.  Associated symptoms include headache, visual disturbances, and epigastric/RUQ pain. Headache described as constant dull pain, she has not taken any medication for it since leaving the MAU. Vision symptoms include visual blurring during headache. She has a history of previous pregnancy with preeclampsia (x2)and AMA that increases the risk of preeclampsia. Her last dose of labetalol  was at 1600, typically takes third dose of the day around 2200 before bed.  Additional history obtained from partner.  Pregnancy Course: Receives care at [facility]. Prenatal records reviewed.   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  01/10/16 Titer 4  [x]  MFM referral- serial MCA doppler  FOB is negative so no further testing is needed (pregnancy 2023--[name])   Migraines    Pregnancy induced hypertension    OB History  Gravida Para Term Preterm AB Living  8 4 2 2 3 4   SAB IAB Ectopic Multiple Live Births  3   0 4    # Outcome Date GA Lbr Len/2nd Weight Sex Type Anes PTL Lv  8 Current           7 SAB 08/27/22 [redacted]w[redacted]d         6 Preterm 07/14/16 100w0d 21:04 / 00:15 3300 g M Vag-Spont EPI  LIV     Birth Comments:  PPH  5 Preterm 04/30/14 [redacted]w[redacted]d / 00:14 2781 g M Vag-Spont EPI  LIV     Birth Comments: within normal limits  4 SAB 06/09/13          3 Term 09/01/10 [redacted]w[redacted]d  4338 g M Vag-Spont EPI N LIV     Birth Comments: PPH  2 Term 12/03/07 [redacted]w[redacted]d  3884 g M Vag-Spont EPI N LIV  1 SAB            Past Surgical History:  Procedure Laterality Date   TONSILLECTOMY     Family History  Problem Relation Age of Onset   Anxiety disorder Mother    Hypertension Mother    Heart murmur Mother    Breast cancer Maternal Aunt    Breast cancer Paternal Grandmother    Cervical cancer Maternal Aunt    Stroke Maternal Grandmother    Social History   Tobacco Use   Smoking status: Never   Smokeless tobacco: Never  Vaping Use   Vaping status: Never Used  Substance Use Topics   Alcohol use: No   Drug use: No   No Known Allergies No medications prior to admission.    I have reviewed patient's Past Medical Hx, Surgical Hx, Family Hx, Social Hx, medications and allergies.   ROS  Pertinent items noted in HPI and remainder of comprehensive ROS  otherwise negative.   PHYSICAL EXAM  Patient Vitals for the past 24 hrs:  BP Temp Temp src Pulse Resp SpO2 Height Weight  08/23/23 2145 126/84 -- -- 94 -- -- -- --  08/23/23 2131 123/68 -- -- (!) 103 -- -- -- --  08/23/23 2115 126/84 -- -- 95 -- -- -- --  08/23/23 2100 131/88 -- -- 99 -- -- -- --  08/23/23 2045 (!) 136/90 -- -- 93 -- -- -- --  08/23/23 2030 (!) 133/90 -- -- 94 -- -- -- --  08/23/23 2028 (!) 142/93 -- -- (!) 108 -- -- -- --  08/23/23 2013 (!) 140/91 97.9 F (36.6 C) Oral 98 17 100 % 5\' 7"  (1.702 m) 91.5 kg    Constitutional: Well-developed, well-nourished female in no acute distress.  HEENT: atraumatic, normocephalic. Neck has normal ROM. EOM intact. Cardiovascular: normal rate & rhythm, warm and well-perfused Respiratory: normal effort, no problems with respiration noted GI: Abd soft, non-distended. RUQ TTP. MSK: Extremities nontender, no edema,  normal ROM Skin: warm and dry. Acyanotic, no jaundice or pallor. Neurologic: Alert and oriented x 4. No abnormal coordination. Psychiatric: Normal mood. Speech not slurred, not rapid/pressured. Patient is cooperative. GU: no CVA tenderness     Fetal Tracing: Baseline FHR: 135 per minute Fetal heart variability: moderate Fetal Heart Rate accelerations: yes Fetal Heart Rate decelerations: none Fetal Non-stress Test: reactive Toco: no regular uterine contractions   Labs: Results for orders placed or performed during the hospital encounter of 08/23/23 (from the past 24 hours)  Protein / creatinine ratio, urine     Status: None   Collection Time: 08/23/23  7:57 PM  Result Value Ref Range   Creatinine, Urine 97 mg/dL   Total Protein, Urine 8 mg/dL   Protein Creatinine Ratio 0.08 0.00 - 0.15 mg/mg[Cre]  CBC     Status: Abnormal   Collection Time: 08/23/23  8:18 PM  Result Value Ref Range   WBC 6.8 4.0 - 10.5 K/uL   RBC 3.91 3.87 - 5.11 MIL/uL   Hemoglobin 12.2 12.0 - 15.0 g/dL   HCT 16.1 (L) 09.6 - 04.5 %   MCV 90.3 80.0 - 100.0 fL   MCH 31.2 26.0 - 34.0 pg   MCHC 34.6 30.0 - 36.0 g/dL   RDW 40.9 81.1 - 91.4 %   Platelets 162 150 - 400 K/uL   nRBC 0.0 0.0 - 0.2 %  Comprehensive metabolic panel with GFR     Status: Abnormal   Collection Time: 08/23/23  8:18 PM  Result Value Ref Range   Sodium 138 135 - 145 mmol/L   Potassium 3.6 3.5 - 5.1 mmol/L   Chloride 104 98 - 111 mmol/L   CO2 22 22 - 32 mmol/L   Glucose, Bld 117 (H) 70 - 99 mg/dL   BUN <5 (L) 6 - 20 mg/dL   Creatinine, Ser 7.82 0.44 - 1.00 mg/dL   Calcium  9.0 8.9 - 10.3 mg/dL   Total Protein 5.6 (L) 6.5 - 8.1 g/dL   Albumin 2.9 (L) 3.5 - 5.0 g/dL   AST 23 15 - 41 U/L   ALT 16 0 - 44 U/L   Alkaline Phosphatase 64 38 - 126 U/L   Total Bilirubin 0.6 0.0 - 1.2 mg/dL   GFR, Estimated >95 >62 mL/min   Anion gap 12 5 - 15  Lipase, blood     Status: None   Collection Time: 08/23/23  8:18 PM  Result Value Ref Range  Lipase 39 11 - 51 U/L    Imaging:  No results found.  MDM & MAU COURSE  MDM: High  MAU Course: -Blood pressure >140/90 on presentation. Will monitor. -Preeclampsia labs. Lipase to rule out pancreatitis as cause of RUQ pain. -Excedrin-Tension for headache and RUQ pain. -CBC within normal limits. CMP remains within normal limits.  -Awaiting lipase and urine protein/creatinine ratio results, care handed over to Almond Jaffe NP at 11:13 PM. Report received by Maryclare Smoke, PA [facility] WNL Headache pain level down- patient with chronic HA's, seeing HA specialist. Discussed increasing labetalol , however BP is normal here in MAU.  Recommend she bring her BP cuff with her to her visit in the office on 5/27. Reviewed that her BP will wax and wane due to her history of CHTN. Preeclampsia precautions reviewed. Rx for labetalol  100 mg tablets sent, patient could increase to 400 mg TID over the weekend if needed, she does voice some hesitation with this.    Orders Placed This Encounter  Procedures   CBC   Comprehensive metabolic panel with GFR   Protein / creatinine ratio, urine   Lipase, blood   Discharge patient Discharge disposition: 01-Home or Self Care; Discharge patient date: 08/23/2023   Meds ordered this encounter  Medications   acetaminophen -caffeine  (EXCEDRIN TENSION HEADACHE) 500-65 MG per tablet 2 tablet   labetalol  (NORMODYNE ) 100 MG tablet    Sig: Take 1 tablet (100 mg total) by mouth 3 (three) times daily.    Dispense:  90 tablet    Refill:  0    ASSESSMENT   1. Chronic hypertension with exacerbation during pregnancy in third trimester   2. [redacted] weeks gestation of pregnancy   3. Migraine without aura and without status migrainosus, not intractable     PLAN  Discharge home in stable condition with return precautions.  See above    Torrian Canion, Juliette Oh, NP 08/23/2023 11:13 PM   Follow-up Information     [facility] for [facility] Follow up.   Specialty: Obstetrics and Gynecology Why: As scheduled for ongoing prenatal care Contact information: [address] [place]  [address] [phone]                 Allergies as of 08/23/2023   No Known Allergies      Medication List     TAKE these medications    Aspirin  81 MG Caps Take 162 mg by mouth daily.   cyclobenzaprine  10 MG tablet Commonly known as: FLEXERIL  Take 1 tablet (10 mg total) by mouth every 8 (eight) hours as needed for muscle spasms.   labetalol  300 MG tablet Commonly known as: NORMODYNE  Take 1 tablet (300 mg total) by mouth 3 (three) times daily. What changed: Another medication with the same name was added. Make sure you understand how and when to take each.   labetalol  100 MG tablet Commonly known as: NORMODYNE  Take 1 tablet (100 mg total) by mouth 3 (three) times daily. What changed: You were already taking a medication with the same name, and this prescription was added. Make sure you understand how and when to take each.   metoCLOPramide  5 MG tablet Commonly known as: Reglan  Take 1 tablet (5 mg total) by mouth every 8 (eight) hours as needed (migraine/headache).   PRENATAL VITAMINS PO Take by mouth.   prochlorperazine  10 MG tablet Commonly known as: COMPAZINE  Take 1 tablet (10 mg total) by mouth every 6 (six) hours  as needed (headaches).        Almond Jaffe, NP

## 2023-08-23 NOTE — Telephone Encounter (Signed)
 RN received mychart message with recent blood pressure readings at home. Pt reported blood pressures consistently over 140/90s,  hands/feet swollen, no headache, vision/blurry but has been unchanged, not new symptom, no current pain under right breast, did have earlier. RN had patient complete BP while on the phone, current BP 138/89. Pt reports good fetal movement, irregular contractions, denied any leaking or bleeding. Reviewed with Abraham Hoffmann, CNM with recommendations for patient to go to MAU to be evaluated. Pt verbalized understanding, reported currently at work, will need to make provisions for childcare and then plans to go to MAU.  Evelia Hipp, RN

## 2023-08-27 ENCOUNTER — Ambulatory Visit: Attending: Obstetrics and Gynecology

## 2023-08-27 ENCOUNTER — Ambulatory Visit: Admitting: Obstetrics and Gynecology

## 2023-08-27 ENCOUNTER — Ambulatory Visit: Admitting: *Deleted

## 2023-08-27 VITALS — BP 123/83 | HR 77 | Wt 203.0 lb

## 2023-08-27 VITALS — BP 132/86

## 2023-08-27 DIAGNOSIS — R519 Headache, unspecified: Secondary | ICD-10-CM

## 2023-08-27 DIAGNOSIS — Z3480 Encounter for supervision of other normal pregnancy, unspecified trimester: Secondary | ICD-10-CM

## 2023-08-27 DIAGNOSIS — Z3A35 35 weeks gestation of pregnancy: Secondary | ICD-10-CM | POA: Diagnosis not present

## 2023-08-27 DIAGNOSIS — O10919 Unspecified pre-existing hypertension complicating pregnancy, unspecified trimester: Secondary | ICD-10-CM | POA: Diagnosis not present

## 2023-08-27 DIAGNOSIS — O10013 Pre-existing essential hypertension complicating pregnancy, third trimester: Secondary | ICD-10-CM | POA: Diagnosis present

## 2023-08-27 DIAGNOSIS — O3663X0 Maternal care for excessive fetal growth, third trimester, not applicable or unspecified: Secondary | ICD-10-CM | POA: Diagnosis present

## 2023-08-27 DIAGNOSIS — O09523 Supervision of elderly multigravida, third trimester: Secondary | ICD-10-CM

## 2023-08-27 NOTE — Progress Notes (Signed)
   PRENATAL VISIT NOTE  Subjective:  Rebekah Smith is a 38 y.o. [phone] at [redacted]w[redacted]d being seen today for ongoing prenatal care.  She is currently monitored for the following issues for this low-risk pregnancy and has History of postpartum hemorrhage; History of pre-eclampsia in prior pregnancy, currently pregnant; Chronic hypertension affecting pregnancy; Kell isoimmunization during pregnancy; ASCUS with positive high risk HPV cervical; Family history of breast cancer; Supervision of high risk pregnancy; AMA (advanced maternal age) multigravida 35+, second trimester; Hx PTD X 2, [redacted]w[redacted]d, [redacted]w[redacted]d; Hernia, umbilical; Headache in pregnancy, antepartum; Excessive fetal growth affecting management of mother in third trimester, antepartum; and Migraine without aura and without status migrainosus, not intractable on their problem list.  Patient reports no complaints.  Contractions: Irregular. Vag. Bleeding: None.  Movement: Present. Denies leaking of fluid.   The following portions of the patient's history were reviewed and updated as appropriate: allergies, current medications, past family history, past medical history, past social history, past surgical history and problem list.   Objective:    Vitals:   08/27/23 1332 08/27/23 1335  BP: 125/79 123/83  Pulse: 77   Weight: 203 lb (92.1 kg)     Fetal Status:  Fetal Heart Rate (bpm): 127   Movement: Present    General: Alert, oriented and cooperative. Patient is in no acute distress.  Skin: Skin is warm and dry. No rash noted.   Cardiovascular: Normal heart rate noted  Respiratory: Normal respiratory effort, no problems with respiration noted  Abdomen: Soft, gravid, appropriate for gestational age.  Pain/Pressure: Present     Pelvic: Cervical exam deferred        Extremities: Normal range of motion.  Edema: None  Mental Status: Normal mood and affect. Normal behavior. Normal judgment and thought content.   Assessment and Plan:  Pregnancy: [id number]  at [redacted]w[redacted]d  1. Chronic hypertension affecting pregnancy (Primary)  -Patient was seen over the weekend in MAU for elevated BP -Mild range BP's noted in MAU with some normal readings as well -BP normal here. -Currently taking labetalol  300 mg TID.  -Home cuff brought to office and calibrated.  -Given LGA infant and chronic hypertension, requiring frequent medication adjustments, long with recent MAU visit for HTN, it is reasonable for the patient to be induced at or around 37 weeks. This plan is also supported by MFM -Induction scheduled for 09/09/23- orders placed.  -Continue BASA   2. Supervision of high risk pregnancy - GBS next visit  3. Excessive fetal growth affecting management of pregnancy in third trimester, single or unspecified fetus  -Continue MFM visits -BPP today and growth US  next week    Preterm labor symptoms and general obstetric precautions including but not limited to vaginal bleeding, contractions, leaking of fluid and fetal movement were reviewed in detail with the patient. Please refer to After Visit Summary for other counseling recommendations.   No follow-ups on file.  Future Appointments  Date Time Provider Department Center  08/27/2023  3:00 PM [facility] NURSE [facility]  08/27/2023  3:15 PM [facility] NST [facility]  09/02/2023  1:50 PM Rik Chasten, MD [facility]  09/02/2023  3:00 PM [facility] PROVIDER 1 [facility]  09/02/2023  3:30 PM [facility] US5 [facility]    Almond Jaffe, NP

## 2023-08-27 NOTE — Procedures (Signed)
 Rebekah Smith March 29, 1986 [redacted]w[redacted]d  Fetus A Non-Stress Test Interpretation for 08/27/23  Indication: Chronic Hypertenstion and Large for Gestational Age - NST only  Fetal Heart Rate A Mode: External Baseline Rate (A): 130 bpm Variability: Moderate Accelerations: 15 x 15 Decelerations: None Multiple birth?: No  Uterine Activity Mode: Palpation, Toco Contraction Frequency (min): None noted Resting Tone Palpated: Relaxed  Interpretation (Fetal Testing) Nonstress Test Interpretation: Reactive Comments: Reviewed with Dr. Arnie Bibber

## 2023-09-02 ENCOUNTER — Ambulatory Visit: Attending: Obstetrics and Gynecology

## 2023-09-02 ENCOUNTER — Ambulatory Visit: Admitting: Obstetrics & Gynecology

## 2023-09-02 ENCOUNTER — Ambulatory Visit: Admitting: Obstetrics

## 2023-09-02 ENCOUNTER — Other Ambulatory Visit (HOSPITAL_COMMUNITY)
Admission: RE | Admit: 2023-09-02 | Discharge: 2023-09-02 | Disposition: A | Discharge status: Home / Self Care | Source: Ambulatory Visit | Attending: Obstetrics & Gynecology | Admitting: Obstetrics & Gynecology

## 2023-09-02 VITALS — BP 130/84 | HR 83

## 2023-09-02 VITALS — BP 126/86 | HR 87 | Wt 199.0 lb

## 2023-09-02 DIAGNOSIS — O09522 Supervision of elderly multigravida, second trimester: Secondary | ICD-10-CM | POA: Diagnosis present

## 2023-09-02 DIAGNOSIS — O10919 Unspecified pre-existing hypertension complicating pregnancy, unspecified trimester: Secondary | ICD-10-CM

## 2023-09-02 DIAGNOSIS — O09299 Supervision of pregnancy with other poor reproductive or obstetric history, unspecified trimester: Secondary | ICD-10-CM | POA: Diagnosis present

## 2023-09-02 DIAGNOSIS — Z8759 Personal history of other complications of pregnancy, childbirth and the puerperium: Secondary | ICD-10-CM | POA: Insufficient documentation

## 2023-09-02 DIAGNOSIS — O3663X0 Maternal care for excessive fetal growth, third trimester, not applicable or unspecified: Secondary | ICD-10-CM

## 2023-09-02 DIAGNOSIS — Z3480 Encounter for supervision of other normal pregnancy, unspecified trimester: Secondary | ICD-10-CM

## 2023-09-02 DIAGNOSIS — O10013 Pre-existing essential hypertension complicating pregnancy, third trimester: Secondary | ICD-10-CM | POA: Diagnosis not present

## 2023-09-02 DIAGNOSIS — O09293 Supervision of pregnancy with other poor reproductive or obstetric history, third trimester: Secondary | ICD-10-CM | POA: Insufficient documentation

## 2023-09-02 DIAGNOSIS — O09213 Supervision of pregnancy with history of pre-term labor, third trimester: Secondary | ICD-10-CM

## 2023-09-02 DIAGNOSIS — Z1331 Encounter for screening for depression: Secondary | ICD-10-CM | POA: Diagnosis not present

## 2023-09-02 DIAGNOSIS — O09523 Supervision of elderly multigravida, third trimester: Secondary | ICD-10-CM | POA: Diagnosis not present

## 2023-09-02 DIAGNOSIS — O10913 Unspecified pre-existing hypertension complicating pregnancy, third trimester: Secondary | ICD-10-CM

## 2023-09-02 DIAGNOSIS — R519 Headache, unspecified: Secondary | ICD-10-CM

## 2023-09-02 DIAGNOSIS — O26899 Other specified pregnancy related conditions, unspecified trimester: Secondary | ICD-10-CM | POA: Insufficient documentation

## 2023-09-02 DIAGNOSIS — Z3A36 36 weeks gestation of pregnancy: Secondary | ICD-10-CM | POA: Diagnosis present

## 2023-09-02 DIAGNOSIS — Z3483 Encounter for supervision of other normal pregnancy, third trimester: Secondary | ICD-10-CM | POA: Insufficient documentation

## 2023-09-02 DIAGNOSIS — O361930 Maternal care for other isoimmunization, third trimester, not applicable or unspecified: Secondary | ICD-10-CM

## 2023-09-02 NOTE — Progress Notes (Signed)
   PRENATAL VISIT NOTE  Subjective:  Rebekah Smith is a 38 y.o. [phone] at [redacted]w[redacted]d being seen today for ongoing prenatal care.  She is currently monitored for the following issues for this high-risk pregnancy and has History of postpartum hemorrhage; History of pre-eclampsia in prior pregnancy, currently pregnant; Chronic hypertension affecting pregnancy; Kell isoimmunization during pregnancy; ASCUS with positive high risk HPV cervical; Family history of breast cancer; Supervision of high risk pregnancy; AMA (advanced maternal age) multigravida 35+, second trimester; Hx PTD X 2, [redacted]w[redacted]d, [redacted]w[redacted]d; Hernia, umbilical; Headache in pregnancy, antepartum; Excessive fetal growth affecting management of mother in third trimester, antepartum; and Migraine without aura and without status migrainosus, not intractable on their problem list.  Patient reports feelings of shortness of breat--can't take a large breath in. No symptoms of Pre E; Home BPs have been normal  .  Contractions: Irritability. Vag. Bleeding: None.  Movement: Present. Denies leaking of fluid.   The following portions of the patient's history were reviewed and updated as appropriate: allergies, current medications, past family history, past medical history, past social history, past surgical history and problem list.   Objective:    Vitals:   09/02/23 1351  Weight: 199 lb (90.3 kg)   Vitals:   09/02/23 1351  BP: 126/86  Pulse: 87  Weight: 199 lb (90.3 kg)    Fetal Status:      Movement: Present    General: Alert, oriented and cooperative. Patient is in no acute distress.  Skin: Skin is warm and dry. No rash noted.   Cardiovascular: Normal heart rate noted  Respiratory: Normal respiratory effort, CTAB; O2 sat   Abdomen: Soft, gravid, appropriate for gestational age.  Pain/Pressure: Absent     Pelvic: Cervical exam performed in the presence of a chaperone      3-4 /50/posterior/tone/vertec  Extremities: Normal range of motion.   Edema: Trace  Mental Status: Normal mood and affect. Normal behavior. Normal judgment and thought content.   Assessment and Plan:  Pregnancy: [id number] at [redacted]w[redacted]d 1. Chronic hypertension affecting pregnancy (Primary) Increasing medication needs and spikes at home.  Induction scheduled for June 9th  2. Excessive fetal growth affecting management of pregnancy in third trimester, single or unspecified fetus Rpt growth today and BPP at MFM  3. History of pre-eclampsia in prior pregnancy, currently pregnant Continue baby ASA Labs checked today  Term labor symptoms and general obstetric precautions including but not limited to vaginal bleeding, contractions, leaking of fluid and fetal movement were reviewed in detail with the patient. Please refer to After Visit Summary for other counseling recommendations.   No follow-ups on file.  Future Appointments  Date Time Provider Department Center  09/02/2023  3:00 PM [facility] PROVIDER 1 [facility]  09/02/2023  3:30 PM [facility] US5 [facility]  09/09/2023  6:30 AM [facility] SCHED ROOM [facility] None    Glorietta Lark, MD

## 2023-09-02 NOTE — Progress Notes (Signed)
 MFM Consult Note  Rebekah Smith is currently at 36 weeks and 1 day.  She has been followed due to advanced maternal age (38 years old) and chronic hypertension treated with labetalol .  Her blood pressure today was 130/84.  A large for gestational age fetus was noted during her prior ultrasound exams.  She has screened negative for gestational diabetes in her current pregnancy.  On today's exam, the overall EFW of 7 pounds 8 ounces measures at the 94th percentile for her gestational age.    There was normal amniotic fluid noted with a total AFI of 16.38 cm.    A BPP performed today was 8 out of 8.  The fetus was in the vertex presentation.  Due to advanced maternal age, chronic hypertension, and a large for gestational age fetus, she is already scheduled for induction on September 09, 2023 (1 week from today).    No further exams were scheduled in our office.  The patient stated that all of her questions were answered today.  A total of 20 minutes was spent counseling and coordinating the care for this patient.  Greater than 50% of the time was spent in direct face-to-face contact.

## 2023-09-03 ENCOUNTER — Ambulatory Visit: Payer: Self-pay | Admitting: Obstetrics & Gynecology

## 2023-09-03 ENCOUNTER — Telehealth: Payer: Self-pay | Admitting: *Deleted

## 2023-09-03 LAB — COMPREHENSIVE METABOLIC PANEL WITH GFR
ALT: 13 IU/L (ref 0–32)
AST: 23 IU/L (ref 0–40)
Albumin: 3.7 g/dL — ABNORMAL LOW (ref 3.9–4.9)
Alkaline Phosphatase: 101 IU/L (ref 44–121)
BUN/Creatinine Ratio: 11 (ref 9–23)
BUN: 7 mg/dL (ref 6–20)
Bilirubin Total: 0.6 mg/dL (ref 0.0–1.2)
CO2: 19 mmol/L — ABNORMAL LOW (ref 20–29)
Calcium: 8.9 mg/dL (ref 8.7–10.2)
Chloride: 104 mmol/L (ref 96–106)
Creatinine, Ser: 0.61 mg/dL (ref 0.57–1.00)
Globulin, Total: 2.1 g/dL (ref 1.5–4.5)
Glucose: 96 mg/dL (ref 70–99)
Potassium: 4 mmol/L (ref 3.5–5.2)
Sodium: 138 mmol/L (ref 134–144)
Total Protein: 5.8 g/dL — ABNORMAL LOW (ref 6.0–8.5)
eGFR: 119 mL/min/{1.73_m2} (ref >59)

## 2023-09-03 LAB — CERVICOVAGINAL ANCILLARY ONLY
Chlamydia: NEGATIVE
Comment: NEGATIVE
Comment: NEGATIVE
Comment: NORMAL
Neisseria Gonorrhea: NEGATIVE
Trichomonas: NEGATIVE

## 2023-09-03 LAB — CBC
Hematocrit: 37.8 % (ref 34.0–46.6)
Hemoglobin: 12.7 g/dL (ref 11.1–15.9)
MCH: 30.8 pg (ref 26.6–33.0)
MCHC: 33.6 g/dL (ref 31.5–35.7)
MCV: 92 fL (ref 79–97)
Platelets: 170 10*3/uL (ref 150–450)
RBC: 4.13 x10E6/uL (ref 3.77–5.28)
RDW: 13 % (ref 11.7–15.4)
WBC: 6.1 10*3/uL (ref 3.4–10.8)

## 2023-09-03 LAB — PROTEIN / CREATININE RATIO, URINE
Creatinine, Urine: 117.9 mg/dL
Protein, Ur: 24.3 mg/dL
Protein/Creat Ratio: 206 mg/g{creat} — ABNORMAL HIGH (ref 0–200)

## 2023-09-03 NOTE — Telephone Encounter (Signed)
 Left patient a message to call and schedule 4 - 6 week Postpartum.

## 2023-09-04 ENCOUNTER — Telehealth (HOSPITAL_COMMUNITY): Payer: Self-pay | Admitting: *Deleted

## 2023-09-04 ENCOUNTER — Encounter (HOSPITAL_COMMUNITY): Payer: Self-pay

## 2023-09-04 NOTE — Telephone Encounter (Signed)
 Preadmission screen

## 2023-09-05 ENCOUNTER — Telehealth (HOSPITAL_COMMUNITY): Payer: Self-pay | Admitting: *Deleted

## 2023-09-05 ENCOUNTER — Encounter (HOSPITAL_COMMUNITY): Payer: Self-pay | Admitting: *Deleted

## 2023-09-05 LAB — CULTURE, BETA STREP (GROUP B ONLY): Strep Gp B Culture: POSITIVE — AB

## 2023-09-05 NOTE — Telephone Encounter (Signed)
 Preadmission screen

## 2023-09-09 ENCOUNTER — Inpatient Hospital Stay (HOSPITAL_COMMUNITY)
Admission: RE | Admit: 2023-09-09 | Discharge: 2023-09-12 | DRG: 807 | Disposition: A | Discharge status: Home / Self Care | Attending: Obstetrics and Gynecology | Admitting: Obstetrics and Gynecology

## 2023-09-09 ENCOUNTER — Other Ambulatory Visit: Payer: Self-pay

## 2023-09-09 ENCOUNTER — Inpatient Hospital Stay (HOSPITAL_COMMUNITY): Admitting: Anesthesiology

## 2023-09-09 ENCOUNTER — Inpatient Hospital Stay (HOSPITAL_COMMUNITY)

## 2023-09-09 ENCOUNTER — Encounter (HOSPITAL_COMMUNITY): Payer: Self-pay | Admitting: Family Medicine

## 2023-09-09 DIAGNOSIS — O99824 Streptococcus B carrier state complicating childbirth: Secondary | ICD-10-CM | POA: Diagnosis present

## 2023-09-09 DIAGNOSIS — O26893 Other specified pregnancy related conditions, third trimester: Secondary | ICD-10-CM | POA: Diagnosis present

## 2023-09-09 DIAGNOSIS — Z7982 Long term (current) use of aspirin: Secondary | ICD-10-CM | POA: Diagnosis not present

## 2023-09-09 DIAGNOSIS — Z3A37 37 weeks gestation of pregnancy: Secondary | ICD-10-CM

## 2023-09-09 DIAGNOSIS — O1415 Severe pre-eclampsia, complicating the puerperium: Secondary | ICD-10-CM | POA: Diagnosis not present

## 2023-09-09 DIAGNOSIS — O1092 Unspecified pre-existing hypertension complicating childbirth: Secondary | ICD-10-CM | POA: Diagnosis present

## 2023-09-09 DIAGNOSIS — O114 Pre-existing hypertension with pre-eclampsia, complicating childbirth: Secondary | ICD-10-CM | POA: Diagnosis present

## 2023-09-09 DIAGNOSIS — O99354 Diseases of the nervous system complicating childbirth: Secondary | ICD-10-CM | POA: Diagnosis present

## 2023-09-09 DIAGNOSIS — O3663X0 Maternal care for excessive fetal growth, third trimester, not applicable or unspecified: Secondary | ICD-10-CM

## 2023-09-09 DIAGNOSIS — Z8249 Family history of ischemic heart disease and other diseases of the circulatory system: Secondary | ICD-10-CM | POA: Diagnosis not present

## 2023-09-09 DIAGNOSIS — O134 Gestational [pregnancy-induced] hypertension without significant proteinuria, complicating childbirth: Secondary | ICD-10-CM | POA: Diagnosis not present

## 2023-09-09 DIAGNOSIS — O10919 Unspecified pre-existing hypertension complicating pregnancy, unspecified trimester: Principal | ICD-10-CM | POA: Diagnosis present

## 2023-09-09 DIAGNOSIS — Z3480 Encounter for supervision of other normal pregnancy, unspecified trimester: Secondary | ICD-10-CM

## 2023-09-09 DIAGNOSIS — O99820 Streptococcus B carrier state complicating pregnancy: Secondary | ICD-10-CM | POA: Diagnosis not present

## 2023-09-09 DIAGNOSIS — O09523 Supervision of elderly multigravida, third trimester: Secondary | ICD-10-CM | POA: Diagnosis not present

## 2023-09-09 HISTORY — DX: Umbilical hernia without obstruction or gangrene: K42.9

## 2023-09-09 LAB — RPR: RPR Ser Ql: NONREACTIVE

## 2023-09-09 LAB — BPAM RBC
Blood Product Expiration Date: 202506232359
Blood Product Expiration Date: 202506242359
Unit Type and Rh: 5100
Unit Type and Rh: 5100

## 2023-09-09 LAB — CBC
HCT: 33.7 % — ABNORMAL LOW (ref 36.0–46.0)
HCT: 34.7 % — ABNORMAL LOW (ref 36.0–46.0)
Hemoglobin: 11.8 g/dL — ABNORMAL LOW (ref 12.0–15.0)
Hemoglobin: 12 g/dL (ref 12.0–15.0)
MCH: 31.2 pg (ref 26.0–34.0)
MCH: 31 pg (ref 26.0–34.0)
MCHC: 35 g/dL (ref 30.0–36.0)
MCHC: 34.6 g/dL (ref 30.0–36.0)
MCV: 89.2 fL (ref 80.0–100.0)
MCV: 89.7 fL (ref 80.0–100.0)
Platelets: 145 10*3/uL — ABNORMAL LOW (ref 150–400)
Platelets: 148 10*3/uL — ABNORMAL LOW (ref 150–400)
RBC: 3.78 MIL/uL — ABNORMAL LOW (ref 3.87–5.11)
RBC: 3.87 MIL/uL (ref 3.87–5.11)
RDW: 13.6 % (ref 11.5–15.5)
RDW: 13.7 % (ref 11.5–15.5)
WBC: 6.4 10*3/uL (ref 4.0–10.5)
WBC: 7.4 10*3/uL (ref 4.0–10.5)
nRBC: 0 % (ref 0.0–0.2)
nRBC: 0 % (ref 0.0–0.2)

## 2023-09-09 LAB — TYPE AND SCREEN
ABO/RH(D): O POS
Antibody Screen: POSITIVE
Donor AG Type: NEGATIVE
Donor AG Type: NEGATIVE
Unit division: 0
Unit division: 0

## 2023-09-09 LAB — PROTEIN / CREATININE RATIO, URINE
Creatinine, Urine: 45 mg/dL
Total Protein, Urine: <6 mg/dL

## 2023-09-09 MED ORDER — PHENYLEPHRINE 80 MCG/ML (10ML) SYRINGE FOR IV PUSH (FOR BLOOD PRESSURE SUPPORT): 80.0000 ug | PREFILLED_SYRINGE | INTRAVENOUS | Status: DC | PRN

## 2023-09-09 MED ORDER — DIPHENHYDRAMINE HCL 50 MG/ML IJ SOLN: 12.5000 mg | INTRAMUSCULAR | Status: DC | PRN

## 2023-09-09 MED ORDER — WITCH HAZEL-GLYCERIN EX PADS: 1.0000 | MEDICATED_PAD | CUTANEOUS | Status: DC | PRN

## 2023-09-09 MED ORDER — PENICILLIN G POT IN DEXTROSE 60000 UNIT/ML IV SOLN
3.0000 10*6.[IU] | INTRAVENOUS | Status: DC
  Administered 2023-09-09: 3 10*6.[IU] via INTRAVENOUS
  Filled 2023-09-09: qty 50

## 2023-09-09 MED ORDER — EPHEDRINE 5 MG/ML INJ
10.0000 mg | INTRAVENOUS | Status: DC | PRN
  Filled 2023-09-09: qty 5

## 2023-09-09 MED ORDER — FENTANYL-BUPIVACAINE-NACL 0.5-0.125-0.9 MG/250ML-% EP SOLN
12.0000 mL/h | EPIDURAL | Status: DC | PRN
  Administered 2023-09-09: 12 mL/h via EPIDURAL
  Filled 2023-09-09: qty 250

## 2023-09-09 MED ORDER — POTASSIUM CHLORIDE CRYS ER 20 MEQ PO TBCR
20.0000 meq | EXTENDED_RELEASE_TABLET | Freq: Two times a day (BID) | ORAL | Status: DC
  Administered 2023-09-09 – 2023-09-12 (×6): 20 meq via ORAL
  Filled 2023-09-09 (×6): qty 1

## 2023-09-09 MED ORDER — COCONUT OIL OIL: 1.0000 | TOPICAL_OIL | Status: DC | PRN

## 2023-09-09 MED ORDER — CAFFEINE 200 MG PO TABS
200.0000 mg | ORAL_TABLET | Freq: Once | ORAL | Status: AC
  Administered 2023-09-09: 200 mg via ORAL
  Filled 2023-09-09: qty 1

## 2023-09-09 MED ORDER — SENNOSIDES-DOCUSATE SODIUM 8.6-50 MG PO TABS
2.0000 | ORAL_TABLET | Freq: Every day | ORAL | Status: DC
  Administered 2023-09-10 – 2023-09-12 (×3): 2 via ORAL
  Filled 2023-09-09 (×3): qty 2

## 2023-09-09 MED ORDER — ACETAMINOPHEN 325 MG PO TABS
650.0000 mg | ORAL_TABLET | ORAL | Status: DC | PRN
  Administered 2023-09-09 – 2023-09-10 (×3): 650 mg via ORAL
  Filled 2023-09-09 (×4): qty 2

## 2023-09-09 MED ORDER — LIDOCAINE HCL (PF) 1 % IJ SOLN
30.0000 mL | INTRAMUSCULAR | Status: DC | PRN
Start: 2023-09-09 — End: 2023-09-09

## 2023-09-09 MED ORDER — OXYCODONE-ACETAMINOPHEN 5-325 MG PO TABS: 1.0000 | ORAL_TABLET | ORAL | Status: DC | PRN

## 2023-09-09 MED ORDER — ONDANSETRON HCL 4 MG PO TABS: 4.0000 mg | ORAL_TABLET | ORAL | Status: DC | PRN

## 2023-09-09 MED ORDER — OXYTOCIN-SODIUM CHLORIDE 30-0.9 UT/500ML-% IV SOLN
2.5000 [IU]/h | INTRAVENOUS | Status: DC
  Administered 2023-09-09: 2.5 [IU]/h via INTRAVENOUS

## 2023-09-09 MED ORDER — LIDOCAINE HCL (PF) 1 % IJ SOLN
INTRAMUSCULAR | Status: DC | PRN
  Administered 2023-09-09: 5 mL via EPIDURAL

## 2023-09-09 MED ORDER — SOD CITRATE-CITRIC ACID 500-334 MG/5ML PO SOLN: 30.0000 mL | ORAL | Status: DC | PRN

## 2023-09-09 MED ORDER — OXYTOCIN-SODIUM CHLORIDE 30-0.9 UT/500ML-% IV SOLN
1.0000 m[IU]/min | INTRAVENOUS | Status: DC
  Administered 2023-09-09: 2 m[IU]/min via INTRAVENOUS
  Filled 2023-09-09: qty 500

## 2023-09-09 MED ORDER — IBUPROFEN 600 MG PO TABS
600.0000 mg | ORAL_TABLET | Freq: Four times a day (QID) | ORAL | Status: DC
  Administered 2023-09-09 – 2023-09-12 (×11): 600 mg via ORAL
  Filled 2023-09-09 (×12): qty 1

## 2023-09-09 MED ORDER — FUROSEMIDE 20 MG PO TABS
20.0000 mg | ORAL_TABLET | Freq: Every day | ORAL | Status: DC
  Administered 2023-09-10 – 2023-09-12 (×3): 20 mg via ORAL
  Filled 2023-09-09 (×3): qty 1

## 2023-09-09 MED ORDER — OXYCODONE-ACETAMINOPHEN 5-325 MG PO TABS: 2.0000 | ORAL_TABLET | ORAL | Status: DC | PRN

## 2023-09-09 MED ORDER — TERBUTALINE SULFATE 1 MG/ML IJ SOLN: 0.2500 mg | Freq: Once | INTRAMUSCULAR | Status: DC | PRN

## 2023-09-09 MED ORDER — DIPHENHYDRAMINE HCL 25 MG PO CAPS: 25.0000 mg | ORAL_CAPSULE | Freq: Four times a day (QID) | ORAL | Status: DC | PRN

## 2023-09-09 MED ORDER — SODIUM CHLORIDE 0.9 % IV SOLN
5.0000 10*6.[IU] | Freq: Once | INTRAVENOUS | Status: AC
  Administered 2023-09-09: 5 10*6.[IU] via INTRAVENOUS
  Filled 2023-09-09: qty 5

## 2023-09-09 MED ORDER — OXYCODONE HCL 5 MG PO TABS
10.0000 mg | ORAL_TABLET | ORAL | Status: DC | PRN
  Administered 2023-09-11 – 2023-09-12 (×3): 10 mg via ORAL
  Filled 2023-09-09 (×3): qty 2

## 2023-09-09 MED ORDER — SIMETHICONE 80 MG PO CHEW: 80.0000 mg | CHEWABLE_TABLET | ORAL | Status: DC | PRN

## 2023-09-09 MED ORDER — BENZOCAINE-MENTHOL 20-0.5 % EX AERO
1.0000 | INHALATION_SPRAY | CUTANEOUS | Status: DC | PRN
  Administered 2023-09-10 (×2): 1 via TOPICAL
  Filled 2023-09-09 (×3): qty 56

## 2023-09-09 MED ORDER — OXYTOCIN BOLUS FROM INFUSION
333.0000 mL | Freq: Once | INTRAVENOUS | Status: AC
Start: 2023-09-09 — End: 2023-09-09
  Administered 2023-09-09: 333 mL via INTRAVENOUS

## 2023-09-09 MED ORDER — ONDANSETRON HCL 4 MG/2ML IJ SOLN: 4.0000 mg | INTRAMUSCULAR | Status: DC | PRN

## 2023-09-09 MED ORDER — LACTATED RINGERS IV SOLN
500.0000 mL | Freq: Once | INTRAVENOUS | Status: AC
  Administered 2023-09-09: 500 mL via INTRAVENOUS

## 2023-09-09 MED ORDER — PRENATAL MULTIVITAMIN CH
1.0000 | ORAL_TABLET | Freq: Every day | ORAL | Status: DC
Start: 2023-09-10 — End: 2023-09-12
  Administered 2023-09-10 – 2023-09-12 (×3): 1 via ORAL
  Filled 2023-09-09 (×2): qty 1

## 2023-09-09 MED ORDER — PHENYLEPHRINE 80 MCG/ML (10ML) SYRINGE FOR IV PUSH (FOR BLOOD PRESSURE SUPPORT)
80.0000 ug | PREFILLED_SYRINGE | INTRAVENOUS | Status: DC | PRN
  Filled 2023-09-09: qty 10

## 2023-09-09 MED ORDER — LABETALOL HCL 200 MG PO TABS: 200.0000 mg | ORAL_TABLET | Freq: Three times a day (TID) | ORAL | Status: DC

## 2023-09-09 MED ORDER — DIBUCAINE (PERIANAL) 1 % EX OINT
1.0000 | TOPICAL_OINTMENT | CUTANEOUS | Status: DC | PRN
Start: 2023-09-09 — End: 2023-09-12

## 2023-09-09 MED ORDER — OXYCODONE HCL 5 MG PO TABS
5.0000 mg | ORAL_TABLET | ORAL | Status: DC | PRN
  Administered 2023-09-10 – 2023-09-12 (×3): 5 mg via ORAL
  Filled 2023-09-09 (×3): qty 1

## 2023-09-09 MED ORDER — ONDANSETRON HCL 4 MG/2ML IJ SOLN: 4.0000 mg | Freq: Four times a day (QID) | INTRAMUSCULAR | Status: DC | PRN

## 2023-09-09 MED ORDER — BUTALBITAL-APAP-CAFFEINE 50-325-40 MG PO TABS
1.0000 | ORAL_TABLET | Freq: Four times a day (QID) | ORAL | Status: DC | PRN
  Administered 2023-09-09: 1 via ORAL
  Filled 2023-09-09: qty 1

## 2023-09-09 MED ORDER — LACTATED RINGERS IV SOLN: 500.0000 mL | INTRAVENOUS | Status: DC | PRN

## 2023-09-09 MED ORDER — EPHEDRINE 5 MG/ML INJ
10.0000 mg | INTRAVENOUS | Status: DC | PRN
Start: 2023-09-09 — End: 2023-09-09

## 2023-09-09 MED ORDER — TRANEXAMIC ACID-NACL 1000-0.7 MG/100ML-% IV SOLN
1000.0000 mg | INTRAVENOUS | Status: AC
  Administered 2023-09-09: 1000 mg via INTRAVENOUS

## 2023-09-09 MED ORDER — ACETAMINOPHEN 325 MG PO TABS
650.0000 mg | ORAL_TABLET | ORAL | Status: DC | PRN
  Administered 2023-09-09: 650 mg via ORAL
  Filled 2023-09-09: qty 2

## 2023-09-09 MED ORDER — LABETALOL HCL 200 MG PO TABS
200.0000 mg | ORAL_TABLET | Freq: Three times a day (TID) | ORAL | Status: DC
  Administered 2023-09-09 – 2023-09-10 (×2): 200 mg via ORAL
  Filled 2023-09-09 (×2): qty 1

## 2023-09-09 MED ORDER — LACTATED RINGERS IV SOLN: INTRAVENOUS | Status: DC

## 2023-09-09 NOTE — Anesthesia Procedure Notes (Signed)
 Epidural Patient location during procedure: OB Start time: 09/09/2023 1:45 PM End time: 09/09/2023 1:58 PM  Staffing Anesthesiologist: Rosalita Combe, MD Performed: anesthesiologist   Preanesthetic Checklist Completed: patient identified, IV checked, site marked, risks and benefits discussed, surgical consent, monitors and equipment checked, pre-op evaluation and timeout performed  Epidural Patient position: sitting Prep: DuraPrep and site prepped and draped Patient monitoring: continuous pulse ox and blood pressure Approach: midline Location: L3-L4 Injection technique: LOR air  Needle:  Needle type: Tuohy  Needle gauge: 17 G Needle length: 9 cm and 9 Needle insertion depth: 7 cm Catheter type: closed end flexible Catheter size: 19 Gauge Catheter at skin depth: 12 cm Test dose: negative  Assessment Events: blood not aspirated, no cerebrospinal fluid, injection not painful, no injection resistance, no paresthesia and negative IV test  Additional Notes Patient identified. Risks/Benefits/Options discussed with patient including but not limited to bleeding, infection, nerve damage, paralysis, failed block, incomplete pain control, headache, blood pressure changes, nausea, vomiting, reactions to medication both or allergic, itching and postpartum back pain. Confirmed with bedside nurse the patient's most recent platelet count. Confirmed with patient that they are not currently taking any anticoagulation, have any bleeding history or any family history of bleeding disorders. Patient expressed understanding and wished to proceed. All questions were answered. Sterile technique was used throughout the entire procedure. Please see nursing notes for vital signs. Test dose was given through epidural needle and negative prior to continuing to dose epidural or start infusion. Warning signs of high block given to the patient including shortness of breath, tingling/numbness in hands, complete motor  block, or any concerning symptoms with instructions to call for help. Patient was given instructions on fall risk and not to get out of bed. All questions and concerns addressed with instructions to call with any issues. 1 Attempt (S) . Patient tolerated procedure well.

## 2023-09-09 NOTE — Progress Notes (Signed)
 Epidural line kept in for salpingectomy scheduled 09/10/23.

## 2023-09-09 NOTE — Anesthesia Preprocedure Evaluation (Addendum)
 Anesthesia Evaluation  Patient identified by MRN, date of birth, ID band Patient awake    Reviewed: Allergy & Precautions, NPO status , Patient's Chart, lab work & pertinent test results  Airway Mallampati: II  TM Distance: >3 FB Neck ROM: Full    Dental no notable dental hx. (+) Teeth Intact, Dental Advisory Given   Pulmonary neg pulmonary ROS   Pulmonary exam normal breath sounds clear to auscultation       Cardiovascular hypertension, Normal cardiovascular exam Rhythm:Regular Rate:Normal     Neuro/Psych  Headaches    GI/Hepatic   Endo/Other  negative endocrine ROS    Renal/GU      Musculoskeletal negative musculoskeletal ROS (+)    Abdominal   Peds  Hematology  (+) Blood dyscrasia (gestation thrombocytopeia) Lab Results      Component                Value               Date                      WBC                      7.4                 09/09/2023                HGB                      12.0                09/09/2023                HCT                      34.7 (L)            09/09/2023                MCV                      89.7                09/09/2023                PLT                      148 (L)             09/09/2023              Anesthesia Other Findings   Reproductive/Obstetrics (+) Pregnancy                             Anesthesia Physical Anesthesia Plan  ASA: 3  Anesthesia Plan: Epidural   Post-op Pain Management:    Induction:   PONV Risk Score and Plan:   Airway Management Planned:   Additional Equipment:   Intra-op Plan:   Post-operative Plan:   Informed Consent: I have reviewed the patients History and Physical, chart, labs and discussed the procedure including the risks, benefits and alternatives for the proposed anesthesia with the patient or authorized representative who has indicated his/her understanding and acceptance.       Plan Discussed  with:   Anesthesia Plan Comments: (37.1 wk G8P4 w hx cHtn  prE of post partum hemmorrhage)       Anesthesia Quick Evaluation

## 2023-09-09 NOTE — H&P (Signed)
 OBSTETRIC ADMISSION HISTORY AND PHYSICAL  Rebekah Smith is a 38 y.o. female [phone] with IUP at [redacted]w[redacted]d by LMP presenting for IOL. She reports +FMs, No LOF, no VB, no blurry vision, headaches or peripheral edema, and RUQ pain.  She plans on breast feeding. She is undecided about birth control method She received her prenatal care at [facility]. She is here for eIOL at 36wks per her request and discussion with MFM for hx of LGA, severe preeclampsia and postpartum hemorrhage.  Dating: By LMP --->  Estimated Date of Delivery: 09/29/23  Sono:  @[redacted]w[redacted]d , CWD, normal anatomy, cephalic presentation, 3402g, 14% EFW  Prenatal History/Complications: AMA, cHTN, umbilical hernia and migraines  NURSING  PROVIDER  Office Location [place] Dating by U/S at 6 wks  [facility] Model Traditional Anatomy U/S nml  Initiated care at  [facility]  English              LAB RESULTS   Support Person  Genetics NIPS: nml AFP: nml    NT/IT (FT only) LR female (see genetics notes)    Carrier Screen Horizon: nml  Rhogam  O/Positive/-- (11/21 1631) A1C/GTT Early: 5.0 Third trimester: nml  Flu Vaccine Decline    TDaP Vaccine  07/18/23 Blood Type O/Positive/-- (11/21 1631)  Covid Vaccine  Antibody Positive, See Final Results (11/21 1631)  RSV Vaccine  Rubella 1.31 (11/21 1631)  Feeding Plan Breast RPR Non Reactive (11/21 1631)  Contraception Tubal HBsAg Negative (11/21 1631)  Circumcision Yes HIV Non Reactive (11/21 1631)  Pediatrician  [facility] HCVAb Non Reactive (11/21 1631)  Prenatal Classes     BTL Consent Signed Pap Diagnosis  Date Value Ref Range Status  02/21/2023   Final   - Negative for intraepithelial lesion or malignancy (NILM)    BTL Pre-payment  GC/CT Initial:   36wks:    VBAC Consent  GBS   For PCN allergy, check sensitivities        DME Rx [ ]  BP cuff [ ]  Weight Scale Waterbirth  [ ]  Class [ ]  Consent [ ]  CNM visit  PHQ9 & GAD7 [  x] new OB [  ] 28 weeks  [  ] 36 weeks  Induction  [ ]  Orders Entered [ ] Foley Y/N    Past Medical History: Past Medical History:  Diagnosis Date   Abnormal Pap smear of cervix    colposcopy   Anemia    Blood transfusion    Blood transfusion without reported diagnosis 2012   PPH   Chlamydia infection 02/28/2022   Hemorrhage    Hernia, umbilical    History of preeclampsia    Had in two last pregnancies, denies chronic hypertension as she is normotensive when not pregnant   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  01/10/16 Titer 4  [x]  MFM referral- serial MCA doppler  FOB is negative so no further testing is needed (pregnancy 2023--[name])   Migraines    Pregnancy induced hypertension    Past Surgical History: Past Surgical History:  Procedure Laterality Date   TONSILLECTOMY      Obstetrical History: OB History     Gravida  8   Para  4   Term  2   Preterm  2   AB  3   Living  4      SAB  3   IAB  Ectopic      Multiple  0   Live Births  4           Social History Social History   Socioeconomic History   Marital status: Divorced    Spouse name: Not on file   Number of children: Not on file   Years of education: Not on file   Highest education level: Not on file  Occupational History   Occupation: Actuary    Employer: [employer]  Tobacco Use   Smoking status: Never   Smokeless tobacco: Never  Vaping Use   Vaping status: Never Used  Substance and Sexual Activity   Alcohol use: No   Drug use: No   Sexual activity: Yes    Partners: Male    Birth control/protection: None  Other Topics Concern   Not on file  Social History Narrative   Not on file   Social Drivers of Health   Financial Resource Strain: Low Risk  (04/23/2022)   Received from [facility]   Overall Financial Resource Strain (CARDIA)    Difficulty of Paying Living Expenses: Not hard at all  Food Insecurity: No Food  Insecurity (09/09/2023)   Hunger Vital Sign    Worried About Running Out of Food in the Last Year: Never true    Ran Out of Food in the Last Year: Never true  Transportation Needs: No Transportation Needs (09/09/2023)   PRAPARE - Administrator, Civil Service (Medical): No    Lack of Transportation (Non-Medical): No  Physical Activity: Not on file  Stress: Not on file  Social Connections: Unknown (07/31/2021)   Received from [facility]   Social Network    Social Network: Not on file   Family History: Family History  Problem Relation Age of Onset   Anxiety disorder Mother    Hypertension Mother    Heart murmur Mother    Breast cancer Maternal Aunt    Breast cancer Paternal Grandmother    Cervical cancer Maternal Aunt    Stroke Maternal Grandmother    Allergies: No Known Allergies  Medications Prior to Admission  Medication Sig Dispense Refill Last Dose/Taking   Aspirin  81 MG CAPS Take 162 mg by mouth daily. 60 capsule 8 09/08/2023   labetalol  (NORMODYNE ) 300 MG tablet Take 1 tablet (300 mg total) by mouth 3 (three) times daily. (Patient taking differently: Take 200 mg by mouth 3 (three) times daily.) 90 tablet 3 09/09/2023 Morning   Prenatal Vit-Fe Fumarate-FA (PRENATAL VITAMINS PO) Take by mouth.   09/08/2023   Review of Systems  All systems reviewed and negative except as stated in HPI  Blood pressure (!) 147/81, pulse 69, temperature 98.1 F (36.7 C), temperature source Axillary, resp. rate 16, height 5\' 7"  (1.702 m), weight 205 lb 4.8 oz (93.1 kg), last menstrual period 12/02/2022. General appearance: alert and cooperative Lungs: clear to auscultation bilaterally Heart: regular rate and rhythm Abdomen: soft, non-tender; bowel sounds normal Pelvic: normal external female genitalia, no lesions or bleeding Extremities: Homans sign is negative, no sign of DVT DTR's normal Presentation: cephalic Fetal monitoring reactive Uterine activity - Frequency:  Every 4 minutes and Duration: 60 seconds Dilation: 4 Effacement (%): 50 Station: -3 Exam by:: Joen Muskrat RN  Prenatal labs: ABO, Rh: --/--/O POS (06/09 2536) Antibody: POS (06/09 0718) Rubella: 1.31 (11/21 1631) RPR: Non Reactive (04/17 0921)  HBsAg: Negative (11/21 1631)  HIV: Non Reactive (04/17 0921)  GBS: Positive/-- (06/02  0000)    Lab Results  Component Value Date   GBS Positive (A) 09/02/2023   GTT WNL Genetic screening negative Anatomy US  LR female  Immunization History  Administered Date(s) Administered   Influenza,inj,Quad PF,6+ Mos 01/25/2014, 02/07/2016   Influenza-Unspecified 01/20/2019, 01/28/2020, 01/26/2021   MMR 05/02/2014   PFIZER(Purple Top)SARS-COV-2 Vaccination 11/19/2019, 12/10/2019   Tdap 03/29/2014, 07/18/2023   Prenatal Transfer Tool  Maternal Diabetes: No Genetic Screening: Normal Maternal Ultrasounds/Referrals: Normal Fetal Ultrasounds or other Referrals:  None Maternal Substance Abuse:  No Significant Maternal Medications:  None Significant Maternal Lab Results: Group B Strep positive Number of Prenatal Visits:greater than 3 verified prenatal visits Other Comments:  None  Results for orders placed or performed during the hospital encounter of 09/09/23 (from the past 24 hours)  CBC   Collection Time: 09/09/23  6:53 AM  Result Value Ref Range   WBC 6.4 4.0 - 10.5 K/uL   RBC 3.78 (L) 3.87 - 5.11 MIL/uL   Hemoglobin 11.8 (L) 12.0 - 15.0 g/dL   HCT 86.5 (L) 78.4 - 69.6 %   MCV 89.2 80.0 - 100.0 fL   MCH 31.2 26.0 - 34.0 pg   MCHC 35.0 30.0 - 36.0 g/dL   RDW 29.5 28.4 - 13.2 %   Platelets 145 (L) 150 - 400 K/uL   nRBC 0.0 0.0 - 0.2 %  Type and screen   Collection Time: 09/09/23  7:18 AM  Result Value Ref Range   ABO/RH(D) O POS    Antibody Screen POS    Sample Expiration 09/12/2023,2359    Antibody Identification ANTI K    Unit Number [id number]    Blood Component Type RED CELLS,LR    Unit division 00    Status of Unit  ALLOCATED    Donor AG Type NEGATIVE FOR KELL ANTIGEN    Transfusion Status OK TO TRANSFUSE    Crossmatch Result COMPATIBLE    Unit Number [id number]    Blood Component Type RED CELLS,LR    Unit division 00    Status of Unit ALLOCATED    Donor AG Type NEGATIVE FOR KELL ANTIGEN    Transfusion Status OK TO TRANSFUSE    Crossmatch Result COMPATIBLE   BPAM RBC   Collection Time: 09/09/23  7:18 AM  Result Value Ref Range   Blood Product Unit Number [id number]    PRODUCT CODE E0382V00    Unit Type and Rh 5100    Blood Product Expiration Date 166063016010    Blood Product Unit Number [id number]    PRODUCT CODE E0382V00    Unit Type and Rh 5100    Blood Product Expiration Date 427062376283   Protein / creatinine ratio, urine   Collection Time: 09/09/23 10:30 AM  Result Value Ref Range   Creatinine, Urine 45 mg/dL   Total Protein, Urine <6 mg/dL   Protein Creatinine Ratio        0.00 - 0.15 mg/mg[Cre]  CBC   Collection Time: 09/09/23  1:06 PM  Result Value Ref Range   WBC 7.4 4.0 - 10.5 K/uL   RBC 3.87 3.87 - 5.11 MIL/uL   Hemoglobin 12.0 12.0 - 15.0 g/dL   HCT 15.1 (L) 76.1 - 60.7 %   MCV 89.7 80.0 - 100.0 fL   MCH 31.0 26.0 - 34.0 pg   MCHC 34.6 30.0 - 36.0 g/dL   RDW 37.1 06.2 - 69.4 %   Platelets 148 (L) 150 - 400 K/uL   nRBC 0.0 0.0 - 0.2 %  Patient Active Problem List   Diagnosis Date Noted   Migraine without aura and without status migrainosus, not intractable 08/09/2023   Excessive fetal growth affecting management of mother in third trimester, antepartum 07/18/2023   Hernia, umbilical 06/03/2023   Headache in pregnancy, antepartum 06/03/2023   AMA (advanced maternal age) multigravida 35+, second trimester 04/08/2023   Hx PTD X 2, [redacted]w[redacted]d, [redacted]w[redacted]d 04/08/2023   Supervision of high risk pregnancy 02/21/2023   Family history of breast cancer 06/30/2020   ASCUS with positive high risk HPV cervical 04/13/2019   Kell isoimmunization during pregnancy 01/16/2016    Chronic hypertension affecting pregnancy    History of postpartum hemorrhage 11/16/2013   History of pre-eclampsia in prior pregnancy, currently pregnant 11/16/2013   Assessment/Plan:  Rebekah Smith is a 38 y.o. [id number] at [redacted]w[redacted]d here for IOL for cHTN  #Labor: Already contracting, latent labor - Pitocin  infusion has started, will assess for AROM once comfortable with her epidural #Pain: Epidural ordered #FWB: Cat 1 #GBS status:  positive #Feeding: Breastmilk  #Reproductive Life planning: Undecided #Circ:  yes  Derick Fleeting, CNM  09/09/2023, 1:51 PM

## 2023-09-09 NOTE — Progress Notes (Signed)
 Patient ID: Rebekah Smith, female   DOB: 02/17/1986, 38 y.o.   MRN: [id number]  Still having headache. Doesn't feel like a normal preeclampsia headache. Does drink caffeine  daily - will trial caffeine  to see if this improves.   BP (!) 151/97   Pulse 72   Temp 98.1 F (36.7 C) (Axillary)   Resp 16   Ht 5\' 7"  (1.702 m)   Wt 93.1 kg   LMP 12/02/2022 (Approximate)   SpO2 99%   BMI 32.15 kg/m   Dilation: 4.5 Effacement (%): 70 Station: -2 Presentation: Vertex Exam by:: Dr. Cathyann Cobia  AROM with clear fluid.  Continue pitocin .  Rebekah Arras J Kang Ishida, DO

## 2023-09-09 NOTE — Lactation Note (Signed)
 This note was copied from a baby's chart. Lactation Consultation Note  Patient Name: Rebekah Smith Today's Date: 09/09/2023 Age:36 hours  Per RN, MOB does not need LC services in L&D, MOB is doing fine with breastfeeding.    Maternal Data    Feeding    LATCH Score                    Lactation Tools Discussed/Used    Interventions    Discharge    Consult Status      [name] 09/09/2023, 5:33 PM

## 2023-09-09 NOTE — Lactation Note (Signed)
 This note was copied from a baby's chart. Lactation Consultation Note  Patient Name: Rebekah Smith [id number] Date: 09/09/2023 Age:36 hours  MOB declined LC services on MBU.   Maternal Data    Feeding    LATCH Score                    Lactation Tools Discussed/Used    Interventions    Discharge    Consult Status      [name] 09/09/2023, 9:53 PM

## 2023-09-10 ENCOUNTER — Encounter (HOSPITAL_COMMUNITY)
Admission: RE | Disposition: A | Discharge status: Home / Self Care | Payer: Self-pay | Source: Home / Self Care | Attending: Family Medicine

## 2023-09-10 LAB — COMPREHENSIVE METABOLIC PANEL WITH GFR
ALT: 17 U/L (ref 0–44)
AST: 31 U/L (ref 15–41)
Albumin: 2.7 g/dL — ABNORMAL LOW (ref 3.5–5.0)
Alkaline Phosphatase: 84 U/L (ref 38–126)
Anion gap: 8 (ref 5–15)
BUN: 6 mg/dL (ref 6–20)
CO2: 24 mmol/L (ref 22–32)
Calcium: 9.1 mg/dL (ref 8.9–10.3)
Chloride: 105 mmol/L (ref 98–111)
Creatinine, Ser: 0.59 mg/dL (ref 0.44–1.00)
GFR, Estimated: >60 mL/min (ref >60)
Glucose, Bld: 98 mg/dL (ref 70–99)
Potassium: 4.1 mmol/L (ref 3.5–5.1)
Sodium: 137 mmol/L (ref 135–145)
Total Bilirubin: 0.6 mg/dL (ref 0.0–1.2)
Total Protein: 5.6 g/dL — ABNORMAL LOW (ref 6.5–8.1)

## 2023-09-10 LAB — CBC
HCT: 36.6 % (ref 36.0–46.0)
Hemoglobin: 12.7 g/dL (ref 12.0–15.0)
MCH: 31.2 pg (ref 26.0–34.0)
MCHC: 34.7 g/dL (ref 30.0–36.0)
MCV: 89.9 fL (ref 80.0–100.0)
Platelets: 131 10*3/uL — ABNORMAL LOW (ref 150–400)
RBC: 4.07 MIL/uL (ref 3.87–5.11)
RDW: 13.4 % (ref 11.5–15.5)
WBC: 7.4 10*3/uL (ref 4.0–10.5)
nRBC: 0 % (ref 0.0–0.2)

## 2023-09-10 LAB — BIRTH TISSUE RECOVERY COLLECTION (PLACENTA DONATION)

## 2023-09-10 SURGERY — LIGATION, FALLOPIAN TUBE, POSTPARTUM
Anesthesia: Choice | Laterality: Bilateral

## 2023-09-10 MED ORDER — NIFEDIPINE 10 MG PO CAPS: 20.0000 mg | ORAL_CAPSULE | ORAL | Status: DC | PRN

## 2023-09-10 MED ORDER — MAGNESIUM SULFATE BOLUS VIA INFUSION
4.0000 g | Freq: Once | INTRAVENOUS | Status: AC
  Administered 2023-09-10: 4 g via INTRAVENOUS
  Filled 2023-09-10: qty 1000

## 2023-09-10 MED ORDER — METOPROLOL SUCCINATE ER 50 MG PO TB24
50.0000 mg | ORAL_TABLET | Freq: Every day | ORAL | Status: DC
  Administered 2023-09-10 – 2023-09-11 (×2): 50 mg via ORAL
  Filled 2023-09-10 (×2): qty 1

## 2023-09-10 MED ORDER — NIFEDIPINE 10 MG PO CAPS: 10.0000 mg | ORAL_CAPSULE | ORAL | Status: DC | PRN

## 2023-09-10 MED ORDER — MAGNESIUM SULFATE 40 GM/1000ML IV SOLN
2.0000 g/h | INTRAVENOUS | Status: AC
  Administered 2023-09-10 – 2023-09-11 (×2): 2 g/h via INTRAVENOUS
  Filled 2023-09-10 (×2): qty 1000

## 2023-09-10 MED ORDER — LABETALOL HCL 5 MG/ML IV SOLN: 40.0000 mg | INTRAVENOUS | Status: DC | PRN

## 2023-09-10 MED ORDER — LACTATED RINGERS IV SOLN: INTRAVENOUS | Status: AC

## 2023-09-10 NOTE — Progress Notes (Signed)
 Pt transported to [facility] room 118 with infant, family and belongings. Report called to [facility] RN  prior to transport to other unit. Pt stable at this time.

## 2023-09-10 NOTE — Progress Notes (Addendum)
 RN in room to administer scheduled medications at same time NT in room for routine VS. BP 134/90. Pt complaining of headaches and blurred vision now- this is new for her. Pt told RN she had a headache this morning but went away quickly with sleep and ibuprofen /tylenol . Now the headache has returned and added blurred vision- pt sitting in bed calm when reporting this, had not ambulated for a couple hours. RN contacted MD Nolon Baxter who will order labwork and MAG with transfer to [facility]. No orders for additional BP meds at this time. RN will continue to monitor at this time.

## 2023-09-10 NOTE — Anesthesia Postprocedure Evaluation (Signed)
 Anesthesia Post Note  Patient: Rebekah Smith  Procedure(s) Performed: AN AD HOC LABOR EPIDURAL     Patient location during evaluation: Mother Baby Anesthesia Type: Epidural Level of consciousness: awake Pain management: satisfactory to patient Vital Signs Assessment: post-procedure vital signs reviewed and stable Respiratory status: spontaneous breathing Cardiovascular status: stable Anesthetic complications: no  No notable events documented.  Last Vitals:  Vitals:   09/10/23 0805 09/10/23 1305  BP: 138/79 (!) 134/90  Pulse: 68 64  Resp: 18 18  Temp: 36.9 C 36.8 C  SpO2: 100% 100%    Last Pain:  Vitals:   09/10/23 1424  TempSrc:   PainSc: 1    Pain Goal: Patients Stated Pain Goal: 0 (09/09/23 1009)                 Sullivan Endow

## 2023-09-10 NOTE — Progress Notes (Signed)
 Post Partum Day 1 Subjective: no complaints, up ad lib, voiding and tolerating PO, small lochia, plans to breastfeed, plans interval salpingectomy.  Was on metoprolol  prior to pregnacy  Objective: Blood pressure 133/86, pulse 63, temperature 98.1 F (36.7 C), temperature source Oral, resp. rate 18, height 5\' 7"  (1.702 m), weight 93.1 kg, last menstrual period 12/02/2022, SpO2 100%, unknown if currently breastfeeding.  Physical Exam:  General: alert, cooperative and no distress Lochia:normal flow Chest: CTAB Heart: RRR no m/r/g Abdomen: +BS, soft, nontender,  Uterine Fundus: firm DVT Evaluation: No evidence of DVT seen on physical exam. Extremities: no edema  Recent Labs    09/09/23 0653 09/09/23 1306  HGB 11.8* 12.0  HCT 33.7* 34.7*    Assessment/Plan: Plan for discharge tomorrow, Breastfeeding, and Lactation consult Change BP meds to metoprolol  and enrol in BBrx   LOS: 1 day   Majel Scott 09/10/2023, 6:52 AM

## 2023-09-11 MED ORDER — LABETALOL HCL 100 MG PO TABS
100.0000 mg | ORAL_TABLET | Freq: Two times a day (BID) | ORAL | Status: DC
  Administered 2023-09-11 – 2023-09-12 (×2): 100 mg via ORAL
  Filled 2023-09-11 (×2): qty 1

## 2023-09-11 NOTE — Progress Notes (Signed)
 Post Partum Day 2 Subjective: Patient reports feeling well this morning without any complaints. She denies HA, visual changes, RUQ/epigastric pain, nausea or emesis  Objective: Blood pressure 121/78, pulse 68, temperature 97.9 F (36.6 C), temperature source Oral, resp. rate 18, height 5' 7 (1.702 m), weight 93.1 kg, last menstrual period 12/02/2022, SpO2 94%, unknown if currently breastfeeding.  Physical Exam:  General: alert, cooperative, and no distress Lochia: appropriate Uterine Fundus: firm LUNGS: Clear to auscultation bilaterally.  HEART: Regular rate and rhythm. EXTREMITIES: No cyanosis, clubbing, or edema, 2+ distal pulses. DVT Evaluation: No evidence of DVT seen on physical exam.  Recent Labs    09/09/23 1306 09/10/23 1437  HGB 12.0 12.7  HCT 34.7* 36.6    Assessment/Plan: Patient PPD#2 with CHTN superimposed severe preeclampsia - Continue magnesium  sulfate for seizure prophylaxis until 3 pm - BP stable on Metoprolol . Continue monitoring - Plan for interval sterilization procedure - Continue routine postpartum care   LOS: 2 days   Verlyn Goad, MD 09/11/2023, 6:32 AM

## 2023-09-12 ENCOUNTER — Other Ambulatory Visit (HOSPITAL_COMMUNITY): Payer: Self-pay

## 2023-09-12 MED ORDER — FUROSEMIDE 20 MG PO TABS
20.0000 mg | ORAL_TABLET | Freq: Every day | ORAL | 0 refills | Status: AC
  Filled 2023-09-12: qty 5, 5d supply, fill #0

## 2023-09-12 MED ORDER — IBUPROFEN 600 MG PO TABS
600.0000 mg | ORAL_TABLET | Freq: Four times a day (QID) | ORAL | 0 refills | Status: AC
  Filled 2023-09-12: qty 30, 8d supply, fill #0

## 2023-09-12 MED ORDER — LABETALOL HCL 100 MG PO TABS
100.0000 mg | ORAL_TABLET | Freq: Two times a day (BID) | ORAL | 1 refills | Status: DC
  Filled 2023-09-12 (×3): qty 60, 30d supply, fill #0

## 2023-09-12 NOTE — Plan of Care (Deleted)
 Plan of care

## 2023-09-12 NOTE — Discharge Summary (Signed)
 Postpartum Discharge Summary  Date of Service updated 09/12/2023      Patient Name: Rebekah Smith DOB: [birth year] MRN: [id number]  Date of admission: 09/09/2023 Delivery date:09/09/2023 Delivering provider: Lemuel Quaker R Date of discharge: 09/12/2023  Admitting diagnosis: Chronic hypertension affecting pregnancy [O10.919] Intrauterine pregnancy: [redacted]w[redacted]d     Secondary diagnosis:  Principal Problem:   Chronic hypertension affecting pregnancy Active Problems:   Severe pre-eclampsia, postpartum  Additional problems: none    Discharge diagnosis:        Principal Problem:   Chronic hypertension affecting pregnancy Active Problems:   Severe pre-eclampsia, postpartum                                       Post partum procedures:no Augmentation: Pitocin  Complications: None  Hospital course: Induction of Labor With Vaginal Delivery   38 y.o. yo [id number] at [redacted]w[redacted]d was admitted to the hospital 09/09/2023 for induction of labor.  Indication for induction: Gestational hypertension.  Patient had an labor course complicated by postpartum preeclampsia Membrane Rupture Time/Date: 2:44 PM,09/09/2023  Delivery Method:Vaginal, Spontaneous Operative Delivery:N/A Episiotomy: None Lacerations:  2nd degree Details of delivery can be found in separate delivery note.  Patient had a postpartum course complicated by preeclampsia. Patient is discharged home 09/12/23.  Newborn Data: Birth date:09/09/2023 Birth time:4:07 PM Gender:Female Living status:Living Apgars:8 ,9  Weight:3730 g  Magnesium  Sulfate received: Yes: Seizure prophylaxis BMZ received: No Rhophylac:No MMR:No T-DaP:Given prenatally Flu: No RSV Vaccine received: No Transfusion:No  Immunizations received: Immunization History  Administered Date(s) Administered   Influenza,inj,Quad PF,6+ Mos 01/25/2014, 02/07/2016   Influenza-Unspecified 01/20/2019, 01/28/2020, 01/26/2021   MMR 05/02/2014   PFIZER(Purple Top)SARS-COV-2 Vaccination  11/19/2019, 12/10/2019   Tdap 03/29/2014, 07/18/2023    Physical exam  Vitals:   09/11/23 2100 09/11/23 2333 09/12/23 0350 09/12/23 0839  BP:  122/81 118/73 117/82  Pulse:  75 73 70  Resp: 18 17 17 15   Temp:  98.3 F (36.8 C)  98.3 F (36.8 C)  TempSrc:  Oral  Oral  SpO2:  98% 100% 98%  Weight:      Height:       General: alert, cooperative, and no distress Lochia: appropriate Uterine Fundus: firm Incision: N/A DVT Evaluation: No evidence of DVT seen on physical exam. Labs: Lab Results  Component Value Date   WBC 7.4 09/10/2023   HGB 12.7 09/10/2023   HCT 36.6 09/10/2023   MCV 89.9 09/10/2023   PLT 131 (L) 09/10/2023      Latest Ref Rng & Units 09/10/2023    2:37 PM  CMP  Glucose 70 - 99 mg/dL 98   BUN 6 - 20 mg/dL 6   Creatinine 1.30 - 8.65 mg/dL 7.84   Sodium 696 - 295 mmol/L 137   Potassium 3.5 - 5.1 mmol/L 4.1   Chloride 98 - 111 mmol/L 105   CO2 22 - 32 mmol/L 24   Calcium  8.9 - 10.3 mg/dL 9.1   Total Protein 6.5 - 8.1 g/dL 5.6   Total Bilirubin 0.0 - 1.2 mg/dL 0.6   Alkaline Phos 38 - 126 U/L 84   AST 15 - 41 U/L 31   ALT 0 - 44 U/L 17    Edinburgh Score:    09/09/2023    6:45 PM  Edinburgh Postnatal Depression Scale Screening Tool  I have been able to laugh and see the funny side of things. 0  I have looked forward with enjoyment to things. 0  I have blamed myself unnecessarily when things went wrong. 0  I have been anxious or worried for no good reason. 0  I have felt scared or panicky for no good reason. 0  Things have been getting on top of me. 0  I have been so unhappy that I have had difficulty sleeping. 0  I have felt sad or miserable. 0  I have been so unhappy that I have been crying. 0  The thought of harming myself has occurred to me. 0  Edinburgh Postnatal Depression Scale Total 0      Data saved with a previous flowsheet row definition   No data recorded  After visit meds:  Allergies as of 09/12/2023   No Known Allergies       Medication List     TAKE these medications    Aspirin  81 MG Caps Take 162 mg by mouth daily.   furosemide 20 MG tablet Commonly known as: LASIX Take 1 tablet (20 mg total) by mouth daily.   ibuprofen  600 MG tablet Commonly known as: ADVIL  Take 1 tablet (600 mg total) by mouth every 6 (six) hours.   labetalol  100 MG tablet Commonly known as: NORMODYNE  Take 1 tablet (100 mg total) by mouth 2 (two) times daily. What changed:  medication strength how much to take when to take this   PRENATAL VITAMINS PO Take by mouth.         Discharge home in stable condition Infant Feeding: Breast Infant Disposition:home with mother Discharge instruction: per After Visit Summary and Postpartum booklet. Activity: Advance as tolerated. Pelvic rest for 6 weeks.  Diet: routine diet Future Appointments:No future appointments. Follow up Visit:  Follow-up Information     [facility] for [facility] Follow up in 1 week(s).   Specialty: Obstetrics and Gynecology Why: BP Contact information: [address] [place]  [address] [phone]                 Please schedule this patient for a In person postpartum visit in 1 week with the following provider: Any provider. Additional Postpartum F/U:BP check 1 week  High risk pregnancy complicated by: HTN Delivery mode:  Vaginal, Spontaneous Anticipated Birth Control:  Plans Interval BTL   09/12/2023 Onnie Bilis, MD

## 2023-09-12 NOTE — Plan of Care (Signed)
  Problem: Education: Goal: Knowledge of General Education information will improve Description: Including pain rating scale, medication(s)/side effects and non-pharmacologic comfort measures Outcome: Progressing   Problem: Health Behavior/Discharge Planning: Goal: Ability to manage health-related needs will improve Outcome: Progressing   Problem: Clinical Measurements: Goal: Ability to maintain clinical measurements within normal limits will improve Outcome: Progressing Goal: Will remain free from infection Outcome: Progressing   Problem: Coping: Goal: Level of anxiety will decrease Outcome: Progressing   

## 2023-09-19 ENCOUNTER — Telehealth: Payer: Self-pay | Admitting: *Deleted

## 2023-09-19 ENCOUNTER — Telehealth (HOSPITAL_COMMUNITY): Payer: Self-pay | Admitting: *Deleted

## 2023-09-19 NOTE — Telephone Encounter (Signed)
Left patient a message to call and schedule. 

## 2023-09-19 NOTE — Telephone Encounter (Signed)
 09/19/2023  Name: Rebekah Smith MRN: [id number] DOB: 10-22-1985  Reason for Call:  Transition of Care Hospital Discharge Call  Contact Status: Patient Contact Status: Complete  Language assistant needed: Interpreter Mode: Interpreter Not Needed        Follow-Up Questions: Do You Have Any Concerns About Your Health As You Heal From Delivery?: No Do You Have Any Concerns About Your Infants Health?: No  Edinburgh Postnatal Depression Scale:  In the Past 7 Days:    PHQ2-9 Depression Scale:     Discharge Follow-up: Edinburgh score requires follow up?:  (says answers are the same as in the hospital when score was 0, endorses she is doing well emotionally) Patient was advised of the following resources:: Support Group, Breastfeeding Support Group (declines postpartum group information via email)  Post-discharge interventions: Reviewed Newborn Safe Sleep Practices  Pearlie Bougie, RN 09/19/2023 12:31

## 2023-09-19 NOTE — Telephone Encounter (Signed)
-----   Message from Lacey Pian sent at 09/10/2023  4:46 PM EDT ----- Regarding: postpartum follow up She will need to see me for 4 wk postpartum follow up for discussion of her tubal.   She also needs f/u with gen surg - can you have holly get her in with them for her umbilical hernia? (I think it's [facility] - she saw them in patient - Dr. Dorrie Gaudier please! If not him, someone in that group.   Thanks, pad

## 2023-09-25 ENCOUNTER — Telehealth: Payer: Self-pay | Admitting: *Deleted

## 2023-09-25 NOTE — Telephone Encounter (Signed)
 Returned call from 09/24/2023. Left patient a message to call and schedule Postpartum appointment.

## 2023-09-30 ENCOUNTER — Other Ambulatory Visit: Payer: Self-pay

## 2023-09-30 ENCOUNTER — Encounter: Payer: Self-pay | Admitting: Obstetrics & Gynecology

## 2023-09-30 ENCOUNTER — Encounter (HOSPITAL_COMMUNITY): Payer: Self-pay

## 2023-09-30 DIAGNOSIS — O1415 Severe pre-eclampsia, complicating the puerperium: Secondary | ICD-10-CM

## 2023-09-30 MED ORDER — LABETALOL HCL 100 MG PO TABS: 100.0000 mg | ORAL_TABLET | Freq: Two times a day (BID) | ORAL | 0 refills | Status: DC

## 2023-10-01 ENCOUNTER — Telehealth: Payer: Self-pay | Admitting: *Deleted

## 2023-10-01 ENCOUNTER — Telehealth: Payer: Self-pay

## 2023-10-01 NOTE — Telephone Encounter (Signed)
 Returned call from 09/30/2023 at 2:33 PM. Left patient a message that the office will schedule her Postpartum appointment and send her a MyChart message.

## 2023-10-01 NOTE — Telephone Encounter (Signed)
 RN received mychart message regarding delay at [facility] with getting refill. RN spoke with [facility] who reported medication was ready for pick up. RN notified patient.  Silvano LELON Piano, RN

## 2023-10-07 ENCOUNTER — Encounter: Payer: Self-pay | Admitting: Obstetrics & Gynecology

## 2023-10-07 ENCOUNTER — Ambulatory Visit: Admitting: Obstetrics & Gynecology

## 2023-10-07 MED ORDER — SLYND 4 MG PO TABS: 1.0000 | ORAL_TABLET | Freq: Every day | ORAL | 12 refills | Status: AC

## 2023-10-07 NOTE — Progress Notes (Signed)
 Post Partum Visit Note  Rebekah Smith is a 38 y.o. [phone] female who presents for a postpartum visit. She is 4 weeks postpartum following a normal spontaneous vaginal delivery.  I have fully reviewed the prenatal and intrapartum course. The delivery was at 37  gestational weeks.  Anesthesia: epidural. Postpartum course has been good. Baby is doing well. Baby is feeding by breast. Bleeding staining only. Bowel function is normal. Bladder function is normal. Patient is not sexually active. Contraception method is birth control pills then schedule for tubal ligation Postpartum depression screening: negative.   The pregnancy intention screening data noted above was reviewed. Potential methods of contraception were discussed. The patient elected to proceed with No data recorded.    Health Maintenance Due  Topic Date Due   Hepatitis B Vaccines (1 of 3 - 19+ 3-dose series) Never done   HPV VACCINES (1 - 3-dose SCDM series) Never done   COVID-19 Vaccine (3 - 2024-25 season) 12/02/2022    The following portions of the patient's history were reviewed and updated as appropriate: allergies, current medications, past family history, past medical history, past social history, past surgical history, and problem list.  Review of Systems Pertinent items noted in HPI and remainder of comprehensive ROS otherwise negative.  Objective:  LMP 12/02/2022 (Approximate)    General:  alert, cooperative, and no distress   Breasts:  normal  Lungs: clear to auscultation bilaterally  Heart:  regular rate and rhythm, S1, S2 normal, no murmur, click, rub or gallop  Abdomen: soft, non-tender; bowel sounds normal; no masses,  no organomegaly   Wound N/a  GU exam:  not indicated       Vitals:   10/07/23 1437  BP: 112/74  Pulse: 82  Weight: 172 lb (78 kg)  Height: 5' 7 (1.702 m)    Assessment:    There are no diagnoses linked to this encounter.  Nml postpartum exam.  Pre E resolved   Plan:    Essential components of care per ACOG recommendations:  1.  Mood and well being: Patient with negative depression screening today. Reviewed local resources for support.  - Patient tobacco use? No.   - hx of drug use? No.    2. Infant care and feeding:  -Patient currently breastmilk feeding? Yes. Discussed returning to work and pumping.  -Social determinants of health (SDOH) reviewed in EPIC. No concerns.The following needs were identified none  3. Sexuality, contraception and birth spacing - Patient does not want a pregnancy in the next year.  Desired family size is 5 children.  - Reviewed reproductive life planning. Reviewed contraceptive methods based on pt preferences and effectiveness.  Patient desired Oral Contraceptive today.   - Discussed birth spacing of 18 months  4. Sleep and fatigue -Encouraged family/partner/community support of 4 hrs of uninterrupted sleep to help with mood and fatigue  5. Physical Recovery  - Discussed patients delivery and complications. She describes her labor as good. - Patient had a Vaginal, no problems at delivery.  Perineal healing reviewed. Patient expressed understanding - Patient has urinary incontinence? No. - Patient is safe to resume physical and sexual activity  6.  Health Maintenance - HM due items addressed Yes - Last pap smear  Diagnosis  Date Value Ref Range Status  02/21/2023   Final   - Negative for intraepithelial lesion or malignancy (NILM)   Pap smear not done at today's visit.  -Breast Cancer screening indicated? No.   7. Chronic Disease/Pregnancy Condition  follow up: Hypertension - PCP follow up--he will switch from labetalol  to long term medication.  -CCS to eval hernia operation and will plan to do BTL at time of hernia repair.   - Slynd  POOPs until BTL. Pt understands to importance of taking every day at the same time.   Burnard Pate, MD   Center for [employer], [facility]

## 2023-10-10 ENCOUNTER — Ambulatory Visit

## 2023-10-10 ENCOUNTER — Other Ambulatory Visit (HOSPITAL_COMMUNITY)
Admission: RE | Admit: 2023-10-10 | Discharge: 2023-10-10 | Disposition: A | Discharge status: Home / Self Care | Source: Ambulatory Visit | Attending: Obstetrics and Gynecology | Admitting: Obstetrics and Gynecology

## 2023-10-10 VITALS — BP 123/75 | HR 76 | Ht 67.0 in | Wt 172.0 lb

## 2023-10-10 DIAGNOSIS — N898 Other specified noninflammatory disorders of vagina: Secondary | ICD-10-CM | POA: Insufficient documentation

## 2023-10-10 DIAGNOSIS — R8290 Unspecified abnormal findings in urine: Secondary | ICD-10-CM | POA: Diagnosis not present

## 2023-10-10 LAB — POCT URINALYSIS DIPSTICK
Bilirubin, UA: NEGATIVE
Blood, UA: NEGATIVE
Glucose, UA: NEGATIVE
Ketones, UA: NEGATIVE
Leukocytes, UA: NEGATIVE
Nitrite, UA: NEGATIVE
Protein, UA: POSITIVE — AB
Spec Grav, UA: 1.02 (ref 1.010–1.025)
Urobilinogen, UA: 0.2 U/dL
pH, UA: 6 (ref 5.0–8.0)

## 2023-10-10 NOTE — Progress Notes (Signed)
 SUBJECTIVE:  38 y.o. female complains of yellow vaginal discharge for 2 day(s) and urine with foul odor Denies abnormal vaginal bleeding or significant pelvic pain or fever.Denies history of known exposure to STD.  No LMP recorded.  OBJECTIVE:  She appears alert, well appearing, in no apparent distress Urine dipstick: negative for all components.  ASSESSMENT:  Vaginal Discharge  Vaginal Odor Foul smell urine    PLAN:  GC, chlamydia, trichomonas, BVAG, CVAG probe and urine culture sent to lab. Treatment: To be determined once lab results are received ROV prn if symptoms persist or worsen.  Silvano LELON Piano, RN

## 2023-10-11 LAB — CERVICOVAGINAL ANCILLARY ONLY
Bacterial Vaginitis (gardnerella): NEGATIVE
Candida Glabrata: NEGATIVE
Candida Vaginitis: NEGATIVE
Chlamydia: NEGATIVE
Comment: NEGATIVE
Comment: NEGATIVE
Comment: NEGATIVE
Comment: NEGATIVE
Comment: NEGATIVE
Comment: NORMAL
Neisseria Gonorrhea: NEGATIVE
Trichomonas: NEGATIVE

## 2023-10-12 LAB — URINE CULTURE

## 2023-10-14 ENCOUNTER — Ambulatory Visit: Payer: Self-pay | Admitting: Obstetrics and Gynecology

## 2023-10-24 ENCOUNTER — Other Ambulatory Visit: Payer: Self-pay

## 2023-10-24 DIAGNOSIS — O1415 Severe pre-eclampsia, complicating the puerperium: Secondary | ICD-10-CM

## 2023-10-24 MED ORDER — LABETALOL HCL 100 MG PO TABS: 100.0000 mg | ORAL_TABLET | Freq: Two times a day (BID) | ORAL | 0 refills | Status: AC

## 2023-11-04 ENCOUNTER — Encounter: Payer: Self-pay | Admitting: Obstetrics & Gynecology

## 2024-04-09 ENCOUNTER — Telehealth: Payer: Self-pay

## 2024-04-09 NOTE — Telephone Encounter (Signed)
 Copied from CRM [id number]. Topic: Appointments - Transfer of Care >> Apr 09, 2024  2:06 PM Amy B wrote: Pt is requesting to transfer FROM: [facility], Adina Donald, PA-C Pt is requesting to transfer TO: Torrence Barrier, MD Reason for requested transfer: patient request It is the responsibility of the team the patient would like to transfer to (Dr. Torrence Jonette Barrier) to reach out to the patient if for any reason this transfer is not acceptable.

## 2024-04-09 NOTE — Telephone Encounter (Signed)
 TOC appt scheduled for 04/28/2024.

## 2024-04-16 ENCOUNTER — Other Ambulatory Visit (HOSPITAL_COMMUNITY)
Admission: RE | Admit: 2024-04-16 | Discharge: 2024-04-16 | Disposition: A | Discharge status: Home / Self Care | Source: Ambulatory Visit | Attending: Obstetrics and Gynecology | Admitting: Obstetrics and Gynecology

## 2024-04-16 ENCOUNTER — Ambulatory Visit

## 2024-04-16 VITALS — BP 124/82 | HR 64 | Ht 67.0 in | Wt 152.0 lb

## 2024-04-16 DIAGNOSIS — Z113 Encounter for screening for infections with a predominantly sexual mode of transmission: Secondary | ICD-10-CM | POA: Diagnosis present

## 2024-04-16 DIAGNOSIS — N898 Other specified noninflammatory disorders of vagina: Secondary | ICD-10-CM | POA: Diagnosis present

## 2024-04-16 DIAGNOSIS — R350 Frequency of micturition: Secondary | ICD-10-CM | POA: Diagnosis not present

## 2024-04-16 LAB — POCT URINALYSIS DIPSTICK
Bilirubin, UA: NEGATIVE
Blood, UA: NEGATIVE
Glucose, UA: NEGATIVE
Ketones, UA: NEGATIVE
Leukocytes, UA: NEGATIVE
Nitrite, UA: NEGATIVE
Protein, UA: POSITIVE — AB
Spec Grav, UA: 1.025
Urobilinogen, UA: 1 U/dL
pH, UA: 7

## 2024-04-16 NOTE — Progress Notes (Signed)
 SUBJECTIVE:  39 y.o. female complains of clear/yellow vaginal discharge and odor for 2 day(s) and urinary frequency, denied burning or urgency Denies abnormal vaginal bleeding or fever.Denies history of known exposure to STD.  No LMP recorded. Breastfeeding, cycle has not returned  OBJECTIVE: VS WNL She appears alert, well appearing, in no apparent distress Urine dipstick: positive for protein.  ASSESSMENT:  Vaginal Discharge  Vaginal Odor Urinary frequency  PLAN:  GC, chlamydia, trichomonas, BVAG, CVAG probe and urine culture sent to lab. Treatment: To be determined once lab results are received ROV prn if symptoms persist or worsen.   Silvano LELON Piano, RN

## 2024-04-17 ENCOUNTER — Ambulatory Visit: Payer: Self-pay | Admitting: Obstetrics and Gynecology

## 2024-04-17 DIAGNOSIS — B9689 Other specified bacterial agents as the cause of diseases classified elsewhere: Secondary | ICD-10-CM

## 2024-04-17 LAB — CERVICOVAGINAL ANCILLARY ONLY
Bacterial Vaginitis (gardnerella): POSITIVE — AB
Candida Glabrata: NEGATIVE
Candida Vaginitis: NEGATIVE
Chlamydia: NEGATIVE
Comment: NEGATIVE
Comment: NEGATIVE
Comment: NEGATIVE
Comment: NEGATIVE
Comment: NEGATIVE
Comment: NORMAL
Neisseria Gonorrhea: NEGATIVE
Trichomonas: NEGATIVE

## 2024-04-17 MED ORDER — METRONIDAZOLE 0.75 % VA GEL: 1.0000 | Freq: Every day | VAGINAL | 1 refills | Status: AC

## 2024-04-18 LAB — URINE CULTURE

## 2024-04-28 ENCOUNTER — Ambulatory Visit: Admitting: Family Medicine

## 2024-06-17 ENCOUNTER — Ambulatory Visit: Admitting: Family Medicine
